# Patient Record
Sex: Female | Born: 1950
Health system: Southern US, Community
[De-identification: ages and names within clinical notes are randomized; demographics above are authoritative.]

## PROBLEM LIST (undated history)

## (undated) DIAGNOSIS — E119 Type 2 diabetes mellitus without complications: Secondary | ICD-10-CM

## (undated) DIAGNOSIS — H269 Unspecified cataract: Secondary | ICD-10-CM

## (undated) DIAGNOSIS — C801 Malignant (primary) neoplasm, unspecified: Secondary | ICD-10-CM

## (undated) DIAGNOSIS — N760 Acute vaginitis: Secondary | ICD-10-CM

## (undated) DIAGNOSIS — U071 COVID-19: Secondary | ICD-10-CM

## (undated) DIAGNOSIS — B9689 Other specified bacterial agents as the cause of diseases classified elsewhere: Secondary | ICD-10-CM

## (undated) DIAGNOSIS — K5790 Diverticulosis of intestine, part unspecified, without perforation or abscess without bleeding: Secondary | ICD-10-CM

## (undated) DIAGNOSIS — E1159 Type 2 diabetes mellitus with other circulatory complications: Secondary | ICD-10-CM

## (undated) DIAGNOSIS — Z923 Personal history of irradiation: Secondary | ICD-10-CM

## (undated) DIAGNOSIS — N390 Urinary tract infection, site not specified: Secondary | ICD-10-CM

## (undated) DIAGNOSIS — I10 Essential (primary) hypertension: Secondary | ICD-10-CM

## (undated) DIAGNOSIS — C50919 Malignant neoplasm of unspecified site of unspecified female breast: Secondary | ICD-10-CM

## (undated) DIAGNOSIS — E118 Type 2 diabetes mellitus with unspecified complications: Secondary | ICD-10-CM

## (undated) DIAGNOSIS — Z9221 Personal history of antineoplastic chemotherapy: Secondary | ICD-10-CM

## (undated) DIAGNOSIS — Z8619 Personal history of other infectious and parasitic diseases: Secondary | ICD-10-CM

## (undated) DIAGNOSIS — M1990 Unspecified osteoarthritis, unspecified site: Secondary | ICD-10-CM

## (undated) DIAGNOSIS — K219 Gastro-esophageal reflux disease without esophagitis: Secondary | ICD-10-CM

## (undated) DIAGNOSIS — I152 Hypertension secondary to endocrine disorders: Secondary | ICD-10-CM

## (undated) DIAGNOSIS — E785 Hyperlipidemia, unspecified: Secondary | ICD-10-CM

## (undated) DIAGNOSIS — F39 Unspecified mood [affective] disorder: Secondary | ICD-10-CM

## (undated) HISTORY — DX: Other specified bacterial agents as the cause of diseases classified elsewhere: N76.0

## (undated) HISTORY — DX: Malignant (primary) neoplasm, unspecified: C80.1

## (undated) HISTORY — DX: Urinary tract infection, site not specified: N39.0

## (undated) HISTORY — DX: Hyperlipidemia, unspecified: E78.5

## (undated) HISTORY — DX: Unspecified cataract: H26.9

## (undated) HISTORY — DX: Essential (primary) hypertension: I10

## (undated) HISTORY — DX: Personal history of other infectious and parasitic diseases: Z86.19

## (undated) HISTORY — DX: Unspecified osteoarthritis, unspecified site: M19.90

## (undated) HISTORY — DX: COVID-19: U07.1

## (undated) HISTORY — DX: Type 2 diabetes mellitus without complications: E11.9

## (undated) HISTORY — DX: Gastro-esophageal reflux disease without esophagitis: K21.9

## (undated) HISTORY — PX: WRIST SURGERY: SHX841

## (undated) HISTORY — DX: Other specified bacterial agents as the cause of diseases classified elsewhere: B96.89

## (undated) HISTORY — DX: Diverticulosis of intestine, part unspecified, without perforation or abscess without bleeding: K57.90

## (undated) HISTORY — PX: BREAST SURGERY: SHX581

---

## 1998-10-04 DIAGNOSIS — C50911 Malignant neoplasm of unspecified site of right female breast: Secondary | ICD-10-CM | POA: Insufficient documentation

## 1998-10-04 DIAGNOSIS — Z9221 Personal history of antineoplastic chemotherapy: Secondary | ICD-10-CM

## 1998-10-04 DIAGNOSIS — Z923 Personal history of irradiation: Secondary | ICD-10-CM

## 1998-10-04 HISTORY — PX: BREAST EXCISIONAL BIOPSY: SUR124

## 1998-10-04 HISTORY — DX: Malignant neoplasm of unspecified site of right female breast: C50.911

## 1998-10-04 HISTORY — DX: Personal history of irradiation: Z92.3

## 1998-10-04 HISTORY — DX: Personal history of antineoplastic chemotherapy: Z92.21

## 2016-12-17 LAB — HM HEPATITIS C SCREENING LAB: HM Hepatitis Screen: NEGATIVE

## 2017-02-23 LAB — HM MAMMOGRAPHY

## 2017-03-15 LAB — HM DEXA SCAN: HM Dexa Scan: NORMAL

## 2018-01-26 ENCOUNTER — Encounter: Payer: Self-pay | Admitting: Family Medicine

## 2018-03-16 ENCOUNTER — Ambulatory Visit: Payer: 59 | Admitting: Internal Medicine

## 2018-03-16 VITALS — BP 152/86 | HR 98 | Temp 98.9°F | Ht 68.5 in | Wt 160.6 lb

## 2018-03-16 DIAGNOSIS — R768 Other specified abnormal immunological findings in serum: Secondary | ICD-10-CM | POA: Diagnosis not present

## 2018-03-16 DIAGNOSIS — I10 Essential (primary) hypertension: Secondary | ICD-10-CM

## 2018-03-16 DIAGNOSIS — Z1231 Encounter for screening mammogram for malignant neoplasm of breast: Secondary | ICD-10-CM

## 2018-03-16 DIAGNOSIS — Z853 Personal history of malignant neoplasm of breast: Secondary | ICD-10-CM | POA: Diagnosis not present

## 2018-03-16 DIAGNOSIS — Z23 Encounter for immunization: Secondary | ICD-10-CM

## 2018-03-16 DIAGNOSIS — E785 Hyperlipidemia, unspecified: Secondary | ICD-10-CM | POA: Diagnosis not present

## 2018-03-16 DIAGNOSIS — E1169 Type 2 diabetes mellitus with other specified complication: Secondary | ICD-10-CM | POA: Insufficient documentation

## 2018-03-16 DIAGNOSIS — E1165 Type 2 diabetes mellitus with hyperglycemia: Secondary | ICD-10-CM

## 2018-03-16 NOTE — Progress Notes (Signed)
Pre visit review using our clinic review tool, if applicable. No additional management support is needed unless otherwise documented below in the visit note. 

## 2018-03-16 NOTE — Patient Instructions (Addendum)
Please schedule your mammogram I will order if today We will refer you to [facility] and Dr. Kellie Moor dermatology  You were given pneumonia 23 vaccine today  Please add back hydrochlorothiazide 12.5 mg daily in the am  We will add Januvia, or Tradjenta or Onglyza to control your diabetes  F/u in 3 months  Take care     Diabetes Mellitus and Nutrition When you have diabetes (diabetes mellitus), it is very important to have healthy eating habits because your blood sugar (glucose) levels are greatly affected by what you eat and drink. Eating healthy foods in the appropriate amounts, at about the same times every day, can help you:  Control your blood glucose.  Lower your risk of heart disease.  Improve your blood pressure.  Reach or maintain a healthy weight.  Every person with diabetes is different, and each person has different needs for a meal plan. Your health care provider may recommend that you work with a diet and nutrition specialist (dietitian) to make a meal plan that is best for you. Your meal plan may vary depending on factors such as:  The calories you need.  The medicines you take.  Your weight.  Your blood glucose, blood pressure, and cholesterol levels.  Your activity level.  Other health conditions you have, such as heart or kidney disease.  How do carbohydrates affect me? Carbohydrates affect your blood glucose level more than any other type of food. Eating carbohydrates naturally increases the amount of glucose in your blood. Carbohydrate counting is a method for keeping track of how many carbohydrates you eat. Counting carbohydrates is important to keep your blood glucose at a healthy level, especially if you use insulin or take certain oral diabetes medicines. It is important to know how many carbohydrates you can safely have in each meal. This is different for every person. Your dietitian can help you calculate how many carbohydrates you should have at each  meal and for snack. Foods that contain carbohydrates include:  Bread, cereal, rice, pasta, and crackers.  Potatoes and corn.  Peas, beans, and lentils.  Milk and yogurt.  Fruit and juice.  Desserts, such as cakes, cookies, ice cream, and candy.  How does alcohol affect me? Alcohol can cause a sudden decrease in blood glucose (hypoglycemia), especially if you use insulin or take certain oral diabetes medicines. Hypoglycemia can be a life-threatening condition. Symptoms of hypoglycemia (sleepiness, dizziness, and confusion) are similar to symptoms of having too much alcohol. If your health care provider says that alcohol is safe for you, follow these guidelines:  Limit alcohol intake to no more than 1 drink per day for nonpregnant women and 2 drinks per day for men. One drink equals 12 oz of beer, 5 oz of wine, or 1 oz of hard liquor.  Do not drink on an empty stomach.  Keep yourself hydrated with water, diet soda, or unsweetened iced tea.  Keep in mind that regular soda, juice, and other mixers may contain a lot of sugar and must be counted as carbohydrates.  What are tips for following this plan? Reading food labels  Start by checking the serving size on the label. The amount of calories, carbohydrates, fats, and other nutrients listed on the label are based on one serving of the food. Many foods contain more than one serving per package.  Check the total grams (g) of carbohydrates in one serving. You can calculate the number of servings of carbohydrates in one serving by dividing the  total carbohydrates by 15. For example, if a food has 30 g of total carbohydrates, it would be equal to 2 servings of carbohydrates.  Check the number of grams (g) of saturated and trans fats in one serving. Choose foods that have low or no amount of these fats.  Check the number of milligrams (mg) of sodium in one serving. Most people should limit total sodium intake to less than 2,300 mg per  day.  Always check the nutrition information of foods labeled as "low-fat" or "nonfat". These foods may be higher in added sugar or refined carbohydrates and should be avoided.  Talk to your dietitian to identify your daily goals for nutrients listed on the label. Shopping  Avoid buying canned, premade, or processed foods. These foods tend to be high in fat, sodium, and added sugar.  Shop around the outside edge of the grocery store. This includes fresh fruits and vegetables, bulk grains, fresh meats, and fresh dairy. Cooking  Use low-heat cooking methods, such as baking, instead of high-heat cooking methods like deep frying.  Cook using healthy oils, such as olive, canola, or sunflower oil.  Avoid cooking with butter, cream, or high-fat meats. Meal planning  Eat meals and snacks regularly, preferably at the same times every day. Avoid going long periods of time without eating.  Eat foods high in fiber, such as fresh fruits, vegetables, beans, and whole grains. Talk to your dietitian about how many servings of carbohydrates you can eat at each meal.  Eat 4-6 ounces of lean protein each day, such as lean meat, chicken, fish, eggs, or tofu. 1 ounce is equal to 1 ounce of meat, chicken, or fish, 1 egg, or 1/4 cup of tofu.  Eat some foods each day that contain healthy fats, such as avocado, nuts, seeds, and fish. Lifestyle   Check your blood glucose regularly.  Exercise at least 30 minutes 5 or more days each week, or as told by your health care provider.  Take medicines as told by your health care provider.  Do not use any products that contain nicotine or tobacco, such as cigarettes and e-cigarettes. If you need help quitting, ask your health care provider.  Work with a Social worker or diabetes educator to identify strategies to manage stress and any emotional and social challenges. What are some questions to ask my health care provider?  Do I need to meet with a diabetes  educator?  Do I need to meet with a dietitian?  What number can I call if I have questions?  When are the best times to check my blood glucose? Where to find more information:  American Diabetes Association: diabetes.org/food-and-fitness/food  Academy of Nutrition and Dietetics: PokerClues.dk  [employer] of Diabetes and Digestive and Kidney Diseases (NIH): ContactWire.be Summary  A healthy meal plan will help you control your blood glucose and maintain a healthy lifestyle.  Working with a diet and nutrition specialist (dietitian) can help you make a meal plan that is best for you.  Keep in mind that carbohydrates and alcohol have immediate effects on your blood glucose levels. It is important to count carbohydrates and to use alcohol carefully. This information is not intended to replace advice given to you by your health care provider. Make sure you discuss any questions you have with your health care provider. Document Released: 06/17/2005 Document Revised: 10/25/2016 Document Reviewed: 10/25/2016 Elsevier Interactive Patient Education  2018 [place].  Pneumococcal Polysaccharide Vaccine: What You Need to Know 1. Why get vaccinated?  Vaccination can protect older adults (and some children and younger adults) from pneumococcal disease. Pneumococcal disease is caused by bacteria that can spread from person to person through close contact. It can cause ear infections, and it can also lead to more serious infections of the:  Lungs (pneumonia),  Blood (bacteremia), and  Covering of the brain and spinal cord (meningitis). Meningitis can cause deafness and brain damage, and it can be fatal.  Anyone can get pneumococcal disease, but children under 67 years of age, people with certain medical conditions, adults over 82 years of age, and cigarette smokers  are at the highest risk. About 18,000 older adults die each year from pneumococcal disease in the [place]. Treatment of pneumococcal infections with penicillin and other drugs used to be more effective. But some strains of the disease have become resistant to these drugs. This makes prevention of the disease, through vaccination, even more important. 2. Pneumococcal polysaccharide vaccine (PPSV23) Pneumococcal polysaccharide vaccine (PPSV23) protects against 23 types of pneumococcal bacteria. It will not prevent all pneumococcal disease. PPSV23 is recommended for:  All adults 32 years of age and older,  Anyone 2 through 67 years of age with certain long-term health problems,  Anyone 2 through 67 years of age with a weakened immune system,  Adults 56 through 67 years of age who smoke cigarettes or have asthma.  Most people need only one dose of PPSV. A second dose is recommended for certain high-risk groups. People 71 and older should get a dose even if they have gotten one or more doses of the vaccine before they turned 65. Your healthcare provider can give you more information about these recommendations. Most healthy adults develop protection within 2 to 3 weeks of getting the shot. 3. Some people should not get this vaccine  Anyone who has had a life-threatening allergic reaction to PPSV should not get another dose.  Anyone who has a severe allergy to any component of PPSV should not receive it. Tell your provider if you have any severe allergies.  Anyone who is moderately or severely ill when the shot is scheduled may be asked to wait until they recover before getting the vaccine. Someone with a mild illness can usually be vaccinated.  Children less than 15 years of age should not receive this vaccine.  There is no evidence that PPSV is harmful to either a pregnant woman or to her fetus. However, as a precaution, women who need the vaccine should be vaccinated before becoming  pregnant, if possible. 4. Risks of a vaccine reaction With any medicine, including vaccines, there is a chance of side effects. These are usually mild and go away on their own, but serious reactions are also possible. About half of people who get PPSV have mild side effects, such as redness or pain where the shot is given, which go away within about two days. Less than 1 out of 100 people develop a fever, muscle aches, or more severe local reactions. Problems that could happen after any vaccine:  People sometimes faint after a medical procedure, including vaccination. Sitting or lying down for about 15 minutes can help prevent fainting, and injuries caused by a fall. Tell your doctor if you feel dizzy, or have vision changes or ringing in the ears.  Some people get severe pain in the shoulder and have difficulty moving the arm where a shot was given. This happens very rarely.  Any medication can cause a severe allergic reaction. Such reactions from a  vaccine are very rare, estimated at about 1 in a million doses, and would happen within a few minutes to a few hours after the vaccination. As with any medicine, there is a very remote chance of a vaccine causing a serious injury or death. The safety of vaccines is always being monitored. For more information, visit: http://www.aguilar.org/ 5. What if there is a serious reaction? What should I look for? Look for anything that concerns you, such as signs of a severe allergic reaction, very high fever, or unusual behavior. Signs of a severe allergic reaction can include hives, swelling of the face and throat, difficulty breathing, a fast heartbeat, dizziness, and weakness. These would usually start a few minutes to a few hours after the vaccination. What should I do? If you think it is a severe allergic reaction or other emergency that can't wait, call 9-1-1 or get to the nearest hospital. Otherwise, call your doctor. Afterward, the reaction should  be reported to the Vaccine Adverse Event Reporting System (VAERS). Your doctor might file this report, or you can do it yourself through the VAERS web site at www.vaers.SamedayNews.es, or by calling (985)844-9698. VAERS does not give medical advice. 6. How can I learn more?  Ask your doctor. He or she can give you the vaccine package insert or suggest other sources of information.  Call your local or state health department.  Contact the Centers for Disease Control and Prevention (CDC): ? Call 910-417-4482 (1-800-CDC-INFO) or ? Visit CDC's website at http://hunter.com/ CDC Pneumococcal Polysaccharide Vaccine VIS (01/25/14) This information is not intended to replace advice given to you by your health care provider. Make sure you discuss any questions you have with your health care provider. Document Released: 07/18/2006 Document Revised: 06/10/2016 Document Reviewed: 06/10/2016 Elsevier Interactive Patient Education  2017 [employer].

## 2018-03-17 ENCOUNTER — Encounter: Payer: Self-pay | Admitting: Internal Medicine

## 2018-03-17 MED ORDER — HYDROCHLOROTHIAZIDE 12.5 MG PO CAPS: 12.5000 mg | ORAL_CAPSULE | Freq: Every day | ORAL | 3 refills | Status: DC

## 2018-03-17 MED ORDER — HYDROCHLOROTHIAZIDE 12.5 MG PO CAPS: 12.5000 mg | ORAL_CAPSULE | Freq: Every day | ORAL | 0 refills | Status: DC

## 2018-03-17 MED ORDER — SITAGLIPTIN PHOSPHATE 50 MG PO TABS: 50.0000 mg | ORAL_TABLET | Freq: Every day | ORAL | 1 refills | Status: DC

## 2018-03-17 NOTE — Progress Notes (Signed)
Chief Complaint  Patient presents with  . New Patient (Initial Visit)   New patient to establish PCP 1. H/o right breast cancer needs dx mammo sch last mammo 02/23/17 negative  2. HTN uncontrolled repeat BP 152/64 taking losartan 100 mg qd used to take hctz 12.5 mg qd  3. DM 2 per pt A1C >7.0 recently though reviewed labs and no A1C in labs will get records. She is checking cbg 1x per day   Review of Systems  Constitutional: Negative for weight loss.  HENT: Negative for hearing loss.   Eyes: Negative for blurred vision.  Respiratory: Negative for shortness of breath.   Cardiovascular: Negative for chest pain.  Gastrointestinal: Negative for abdominal pain.  Musculoskeletal: Negative for falls.  Skin: Negative for rash.  Neurological: Negative for headaches.  Psychiatric/Behavioral: Negative for depression and memory loss.   Past Medical History:  Diagnosis Date  . Arthritis   . Cancer [facility])    right breast cancer 2000 lumpectomy 8 rounds chemo, 6 weeks radiation mammo 02/24/17 negative on Arimedex x 7 years   . Diabetes mellitus without complication ([place])   . History of chicken pox   . Hyperlipidemia   . Hypertension   . UTI (urinary tract infection)    Past Surgical History:  Procedure Laterality Date  . BREAST SURGERY     2000  . WRIST SURGERY     Family History  Problem Relation Age of Onset  . Cancer Mother        colon age 30  . Heart disease Father   . Stroke Father   . Arthritis Brother   . Diabetes Brother   . Cancer Maternal Grandmother        breast   . Heart disease Maternal Grandfather   . Arthritis Brother   . Alcohol abuse Son   . Hypertension Son    Social History   Socioeconomic History  . Marital status: Married    Spouse name: Not on file  . Number of children: Not on file  . Years of education: Not on file  . Highest education level: Not on file  Occupational History  . Not on file  Social Needs  . Financial resource strain: Not on file   . Food insecurity:    Worry: Not on file    Inability: Not on file  . Transportation needs:    Medical: Not on file    Non-medical: Not on file  Tobacco Use  . Smoking status: Never Smoker  . Smokeless tobacco: Never Used  Substance and Sexual Activity  . Alcohol use: Yes  . Drug use: Not Currently  . Sexual activity: Yes    Partners: Male  Lifestyle  . Physical activity:    Days per week: Not on file    Minutes per session: Not on file  . Stress: Not on file  Relationships  . Social connections:    Talks on phone: Not on file    Gets together: Not on file    Attends religious service: Not on file    Active member of club or organization: Not on file    Attends meetings of clubs or organizations: Not on file    Relationship status: Not on file  . Intimate partner violence:    Fear of current or ex partner: Not on file    Emotionally abused: Not on file    Physically abused: Not on file    Forced sexual activity: Not on file  Other  Topics Concern  . 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 hydrochlorothiazide (MICROZIDE) 12.5 MG capsule Take 1 capsule (12.5 mg total) by mouth daily. In am  . hydrochlorothiazide (MICROZIDE) 12.5 MG capsule Take 1 capsule (12.5 mg total) by mouth daily. In am  . losartan (COZAAR) 100 MG tablet Take 100 mg by mouth daily.  . meloxicam (MOBIC) 15 MG tablet Take 15 mg by mouth daily.  . metFORMIN (GLUCOPHAGE-XR) 500 MG 24 hr tablet Take 500 mg by mouth. 1TAB in the morning and 2 TAB in the evening.  . Multiple Vitamin (MULTI-VITAMIN DAILY PO) Take by mouth.  . Omega-3 Fatty Acids (FISH OIL PO) Take by mouth.  . rosuvastatin (CRESTOR) 10 MG tablet Take 10 mg by mouth daily.  . Vitamin D,  Cholecalciferol, 1000 units CAPS Take by mouth.  . zolpidem (AMBIEN) 5 MG tablet Take 5 mg by mouth at bedtime as needed for sleep.   No Known Allergies No results found for this or any previous visit (from the past 2160 hour(s)). Objective  Body mass index is 24.06 kg/m. Wt Readings from Last 3 Encounters:  03/16/18 160 lb 9.6 oz (72.8 kg)   Temp Readings from Last 3 Encounters:  03/16/18 98.9 F (37.2 C) (Oral)   BP Readings from Last 3 Encounters:  03/16/18 (!) 152/86   Pulse Readings from Last 3 Encounters:  03/16/18 98    Physical Exam  Constitutional: She is oriented to person, place, and time. She appears well-developed and well-nourished. She is cooperative.  HENT:  Head: Normocephalic and atraumatic.  Mouth/Throat: Oropharynx is clear and moist and mucous membranes are normal.  Eyes: Pupils are equal, round, and reactive to light. Conjunctivae are normal.  Cardiovascular: Normal rate, regular rhythm and normal heart sounds.  Pulmonary/Chest: Effort normal and breath sounds normal.  Neurological: She is alert and oriented to person, place, and time. Gait normal.  Skin: Skin is warm, dry and intact.  Psychiatric: She has a normal mood and affect. Her speech is normal and behavior is normal. Judgment and thought content normal. Cognition and memory are normal.  Nursing note and vitals reviewed.   Assessment   1. HTN/HLD 2. DM 2 She has been on Invokana, Jardiance just stopped [place] due to feeling UTI/yeast sx's with hyperglycemia in the 190s fasting am Not agreeable to injectables currently though disc Victoza/Ozempic   3. H/o right breast cancer mammo 02/23/17 neg 4. H/o lyme + w/o sx's lyme 18 kdIgg, 39 kd igG, 58kd igg, 23 kd igM+Borrelia bands 18.23, 28, 30, 39, 41, 45, 58 w/o sx's  5. HM Plan  1.  Cont losartan 100 add back hctz 12.5 mg qd  Pt never picked up Rx vascepa TGs only sl elevated  Cont Crestor 10   01/05/18 CBC nl, TSH 2.390 TC 109, TG 154, HDL  35, LDL 43, CMET Na 134 Glucose 156, vitamin D 40.7  UA had 12/17/16 and urine protein 2.  On metformin 500 mg xr 1 in am and 2 qhs pna 23 vaccine today  Add Januvia 50 mg qd declines injections for now  Referred to [facility]  DM eye exam  Foot exam at f/u  Records last PCP UA, Urine protein, A1C 3. Referred dx mammo b/l  4.  NTD  5.  Flu had 2018  prevnar had 02/03/17  pna 23 vaccine today  Rx shingrix today  Tdap had 2017 per pt  Records labs, vaccines,pap 2015 and colonoscopy last PCP in [place].  DEXA 03/16/17 normal scanned in chart  Mammogram 02/23/17 dx mammo neg  Provider: Dr. Olivia Mackie McLean-Scocuzza-[facility]

## 2018-03-20 ENCOUNTER — Inpatient Hospital Stay
Admission: RE | Admit: 2018-03-20 | Discharge: 2018-03-20 | Disposition: A | Discharge status: Home / Self Care | Payer: Self-pay | Source: Ambulatory Visit | Attending: *Deleted | Admitting: *Deleted

## 2018-03-20 ENCOUNTER — Other Ambulatory Visit: Payer: Self-pay | Admitting: *Deleted

## 2018-03-20 DIAGNOSIS — Z9289 Personal history of other medical treatment: Secondary | ICD-10-CM

## 2018-03-29 ENCOUNTER — Inpatient Hospital Stay
Admission: RE | Admit: 2018-03-29 | Discharge: 2018-03-29 | Disposition: A | Discharge status: Home / Self Care | Payer: Self-pay | Source: Ambulatory Visit | Attending: *Deleted | Admitting: *Deleted

## 2018-03-29 ENCOUNTER — Other Ambulatory Visit: Payer: Self-pay | Admitting: *Deleted

## 2018-03-29 DIAGNOSIS — Z9289 Personal history of other medical treatment: Secondary | ICD-10-CM

## 2018-03-31 ENCOUNTER — Other Ambulatory Visit: Payer: Self-pay | Admitting: Internal Medicine

## 2018-03-31 DIAGNOSIS — Z1231 Encounter for screening mammogram for malignant neoplasm of breast: Secondary | ICD-10-CM

## 2018-04-17 ENCOUNTER — Ambulatory Visit
Admission: RE | Admit: 2018-04-17 | Discharge: 2018-04-17 | Disposition: A | Discharge status: Home / Self Care | Payer: 59 | Source: Ambulatory Visit | Attending: Internal Medicine | Admitting: Internal Medicine

## 2018-04-17 ENCOUNTER — Encounter (HOSPITAL_COMMUNITY): Payer: Self-pay

## 2018-04-17 DIAGNOSIS — Z1231 Encounter for screening mammogram for malignant neoplasm of breast: Secondary | ICD-10-CM | POA: Insufficient documentation

## 2018-04-17 HISTORY — DX: Personal history of antineoplastic chemotherapy: Z92.21

## 2018-04-17 HISTORY — DX: Personal history of irradiation: Z92.3

## 2018-05-18 LAB — HM DIABETES EYE EXAM

## 2018-05-23 ENCOUNTER — Telehealth: Payer: Self-pay | Admitting: *Deleted

## 2018-05-23 ENCOUNTER — Other Ambulatory Visit: Payer: Self-pay

## 2018-05-23 DIAGNOSIS — E1165 Type 2 diabetes mellitus with hyperglycemia: Secondary | ICD-10-CM

## 2018-05-23 MED ORDER — SITAGLIPTIN PHOSPHATE 50 MG PO TABS: 50.0000 mg | ORAL_TABLET | Freq: Every day | ORAL | 1 refills | Status: DC

## 2018-05-23 NOTE — Telephone Encounter (Signed)
Medication has been refilled.

## 2018-05-23 NOTE — Telephone Encounter (Signed)
Copied from [place] [id number]. Topic: Quick Communication - Rx Refill/Question >> May 23, 2018 12:57 PM Carolyn Stare wrote: Medication  sitaGLIPtin (JANUVIA) 50 MG tablet      med was sent to [facility] pt req it be sent to [facility] order   Preferred Pharmacy    [facility]   Agent: Please be advised that RX refills may take up to 3 business days. We ask that you follow-up with your pharmacy.

## 2018-05-25 MED ORDER — SITAGLIPTIN PHOSPHATE 50 MG PO TABS: 50.0000 mg | ORAL_TABLET | Freq: Every day | ORAL | 1 refills | Status: DC

## 2018-05-25 NOTE — Addendum Note (Signed)
Addended byElpidio Galea T on: 05/25/2018 03:42 PM   Modules accepted: Orders

## 2018-05-25 NOTE — Telephone Encounter (Signed)
Veronica from [facility] order calling about this RX.  States that pt has a high copay and regardless of 30, 60, or 90 day supply it will be same cost.   Pharmacy would like to know if a RX can be sent for the 90 day supply to help pt. Verdene Lennert can be reached at [phone].

## 2018-05-25 NOTE — Telephone Encounter (Signed)
Please advise 

## 2018-05-31 ENCOUNTER — Encounter: Payer: Self-pay | Admitting: Internal Medicine

## 2018-06-13 ENCOUNTER — Encounter: Payer: Self-pay | Admitting: Internal Medicine

## 2018-06-13 ENCOUNTER — Ambulatory Visit: Payer: 59 | Admitting: Internal Medicine

## 2018-06-13 VITALS — BP 152/88 | HR 96 | Temp 98.3°F | Ht 68.5 in | Wt 162.2 lb

## 2018-06-13 DIAGNOSIS — G4700 Insomnia, unspecified: Secondary | ICD-10-CM | POA: Diagnosis not present

## 2018-06-13 DIAGNOSIS — E1165 Type 2 diabetes mellitus with hyperglycemia: Secondary | ICD-10-CM | POA: Diagnosis not present

## 2018-06-13 DIAGNOSIS — Z1283 Encounter for screening for malignant neoplasm of skin: Secondary | ICD-10-CM

## 2018-06-13 DIAGNOSIS — Z23 Encounter for immunization: Secondary | ICD-10-CM

## 2018-06-13 DIAGNOSIS — I10 Essential (primary) hypertension: Secondary | ICD-10-CM | POA: Diagnosis not present

## 2018-06-13 LAB — POCT GLYCOSYLATED HEMOGLOBIN (HGB A1C)
HbA1c POC (<> result, manual entry): 6.9 % (ref 4.0–5.6)
Hemoglobin A1C: 6.9 % — AB (ref 4.0–5.6)

## 2018-06-13 MED ORDER — EMPAGLIFLOZIN 10 MG PO TABS: 10.0000 mg | ORAL_TABLET | Freq: Every day | ORAL | 0 refills | Status: DC

## 2018-06-13 MED ORDER — LOSARTAN POTASSIUM-HCTZ 100-25 MG PO TABS: 1.0000 | ORAL_TABLET | Freq: Every day | ORAL | 3 refills | Status: DC

## 2018-06-13 MED ORDER — GLUCOSE BLOOD VI STRP: ORAL_STRIP | 0 refills | Status: DC

## 2018-06-13 MED ORDER — ROSUVASTATIN CALCIUM 10 MG PO TABS: 10.0000 mg | ORAL_TABLET | Freq: Every day | ORAL | 3 refills | Status: DC

## 2018-06-13 MED ORDER — GLUCOSE BLOOD VI STRP: ORAL_STRIP | 3 refills | Status: DC

## 2018-06-13 MED ORDER — EMPAGLIFLOZIN 10 MG PO TABS: 10.0000 mg | ORAL_TABLET | Freq: Every day | ORAL | 3 refills | Status: DC

## 2018-06-13 MED ORDER — ZOLPIDEM TARTRATE 5 MG PO TABS: 5.0000 mg | ORAL_TABLET | Freq: Every evening | ORAL | 1 refills | Status: DC | PRN

## 2018-06-13 MED ORDER — LOSARTAN POTASSIUM-HCTZ 100-25 MG PO TABS: 1.0000 | ORAL_TABLET | Freq: Every day | ORAL | 0 refills | Status: DC

## 2018-06-13 MED ORDER — METFORMIN HCL ER 500 MG PO TB24: ORAL_TABLET | ORAL | 3 refills | Status: DC

## 2018-06-13 NOTE — Progress Notes (Signed)
Pre visit review using our clinic review tool, if applicable. No additional management support is needed unless otherwise documented below in the visit note. 

## 2018-06-13 NOTE — Progress Notes (Signed)
Chief Complaint  Patient presents with  . Follow-up   F/u  1. DM 2 last A1C >7.0 with former PCP cbgs in am 160s-190s per pt likes carbs januvia 50 mg and metformin 500 in am and 1000 qpm was on jardiance 10 which helped [place] expensive and jardiance now preferred on planned prev also took [place] and didn't like the way it made her feel no issue with jardiance per pt she enjoys carbs  2. HTN BP elevated on hctz 12.5 and losartan 100 mg qd and had meds this am 7 am at home she is getting >130s/>80s  3. H/o precancer vs nmsc left leg saw dermatology 5 years ago and h/o sun exposure in young age wants referral  4. Reviewed prior labs 01/2018 and mammogram and dexa and eye exam   Review of Systems  Constitutional: Negative for weight loss.  HENT: Negative for hearing loss.   Eyes: Negative for blurred vision.  Respiratory: Negative for shortness of breath.   Cardiovascular: Negative for chest pain.  Gastrointestinal: Negative for abdominal pain.  Musculoskeletal: Negative for falls.  Skin: Negative for rash.  Neurological: Negative for headaches.  Psychiatric/Behavioral: Negative for depression and memory loss.   Past Medical History:  Diagnosis Date  . Arthritis   . Cancer [facility])    right breast cancer 2000 lumpectomy 8 rounds chemo, 6 weeks radiation mammo 02/24/17 negative on Arimedex x 7 years   . Diabetes mellitus without complication ([place])   . History of chicken pox   . Hyperlipidemia   . Hypertension   . Personal history of chemotherapy 2000   Rt. Breast  . Personal history of radiation therapy 2000   Rt. breast  . UTI (urinary tract infection)    Past Surgical History:  Procedure Laterality Date  . BREAST EXCISIONAL BIOPSY Right 2000   +  . BREAST SURGERY     2000  . WRIST SURGERY     Family History  Problem Relation Age of Onset  . Cancer Mother        colon age 42  . Heart disease Father   . Stroke Father   . Arthritis Brother   . Diabetes Brother   . Cancer  Maternal Grandmother        breast   . Breast cancer Maternal Grandmother   . Heart disease Maternal Grandfather   . Arthritis Brother   . Alcohol abuse Son   . Hypertension Son    Social History   Socioeconomic History  . Marital status: Married    Spouse name: Not on file  . Number of children: Not on file  . Years of education: Not on file  . Highest education level: Not on file  Occupational History  . Not on file  Social Needs  . Financial resource strain: Not on file  . Food insecurity:    Worry: Not on file    Inability: Not on file  . Transportation needs:    Medical: Not on file    Non-medical: Not on file  Tobacco Use  . Smoking status: Never Smoker  . Smokeless tobacco: Never Used  Substance and Sexual Activity  . Alcohol use: Yes  . Drug use: Not Currently  . Sexual activity: Yes    Partners: Male  Lifestyle  . Physical activity:    Days per week: Not on file    Minutes per session: Not on file  . Stress: Not on file  Relationships  . Social connections:  Talks on phone: Not on file    Gets together: Not on file    Attends religious service: Not on file    Active member of club or organization: Not on file    Attends meetings of clubs or organizations: Not on file    Relationship status: Not on file  . Intimate partner violence:    Fear of current or ex partner: Not on file    Emotionally abused: Not on file    Physically abused: Not on file    Forced sexual activity: Not on file  Other Topics Concern  . 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 dapagliflozin propanediol (FARXIGA) 10 MG TABS tablet Take 10 mg by mouth daily.  . hydrochlorothiazide (MICROZIDE) 12.5 MG capsule Take 1 capsule (12.5 mg  total) by mouth daily. In am  . hydrochlorothiazide (MICROZIDE) 12.5 MG capsule Take 1 capsule (12.5 mg total) by mouth daily. In am  . losartan (COZAAR) 100 MG tablet Take 100 mg by mouth daily.  . meloxicam (MOBIC) 15 MG tablet Take 15 mg by mouth daily.  . metFORMIN (GLUCOPHAGE-XR) 500 MG 24 hr tablet Take 500 mg by mouth. 1TAB in the morning and 2 TAB in the evening.  . Multiple Vitamin (MULTI-VITAMIN DAILY PO) Take by mouth.  . Omega-3 Fatty Acids (FISH OIL PO) Take by mouth.  . rosuvastatin (CRESTOR) 10 MG tablet Take 10 mg by mouth daily.  . sitaGLIPtin (JANUVIA) 50 MG tablet Take 1 tablet (50 mg total) by mouth daily.  . Vitamin D, Cholecalciferol, 1000 units CAPS Take by mouth.  . zolpidem (AMBIEN) 5 MG tablet Take 5 mg by mouth at bedtime as needed for sleep.   No Known Allergies Recent Results (from the past 2160 hour(s))  HM DIABETES EYE EXAM     Status: None   Collection Time: 05/18/18 12:00 AM  Result Value Ref Range   HM Diabetic Eye Exam No Retinopathy No Retinopathy    Comment: [facility] eye f/u i n1 year    Objective  Body mass index is 24.3 kg/m. Wt Readings from Last 3 Encounters:  06/13/18 162 lb 3.2 oz (73.6 kg)  03/16/18 160 lb 9.6 oz (72.8 kg)   Temp Readings from Last 3 Encounters:  06/13/18 98.3 F (36.8 C) (Oral)  03/16/18 98.9 F (37.2 C) (Oral)   BP Readings from Last 3 Encounters:  06/13/18 (!) 152/88  03/16/18 (!) 152/86   Pulse Readings from Last 3 Encounters:  06/13/18 96  03/16/18 98    Physical Exam  Constitutional: She is oriented to person, place, and time. Vital signs are normal. She appears well-developed and well-nourished. She is cooperative.  HENT:  Head: Normocephalic and atraumatic.  Mouth/Throat: Oropharynx is clear and moist and mucous membranes are normal.  Eyes: Pupils are equal, round, and reactive to light. Conjunctivae are normal.  Cardiovascular: Normal rate, regular rhythm and normal heart sounds.  Pulmonary/Chest:  Effort normal and breath sounds normal.  Neurological: She is alert and oriented to person, place, and time.  Skin: Skin is warm and dry.  Possible solar/actinic damage to legs and chest  Psychiatric: She has a normal mood and affect. Her speech is normal and behavior is normal. Judgment and thought content normal. Cognition and memory are normal.  Nursing note and vitals reviewed.   Assessment   1. DM 2 A1C 6.9 today poc  2. HTN  3. HM Plan   1. D/c januvia 50 change to jardiance 10 due to affordability  rec reduce carbs  Cont metformin  Eye exam 05/18/18 neg  Will do foot exam in future  UA/urine protein done today  Refilled chronic medications  2. Inc losartan 100 hctz 12.5 to hyzaar 100-25 mg qd  Monitor  3.  Given flu shot today  prevnar had 02/03/17  pna 23 utd   Rx shingrix in past  Tdap had 2017 per pt   Consider check MMR, hep B/C at next visit   Requested Records labs, vaccines,pap 2015 and colonoscopy last PCP per pt had 05/2014 normal mom +colon cancer due 05/2019 DEXA 03/16/17 normal scanned in chart  Mammogram 04/17/18 negative  Out of age window pap no records from prior PCP  Referred dermatology today  Fasting labs comprehensive at next visit   Provider: Dr. Olivia Mackie McLean-Scocuzza-Internal Medicine

## 2018-06-13 NOTE — Patient Instructions (Addendum)
Zevia soda  Wheat/multigrain  Zucchini or Squash pasta alternatives   A1C 6.9 at goal <7.0   Diabetes Mellitus and Nutrition When you have diabetes (diabetes mellitus), it is very important to have healthy eating habits because your blood sugar (glucose) levels are greatly affected by what you eat and drink. Eating healthy foods in the appropriate amounts, at about the same times every day, can help you:  Control your blood glucose.  Lower your risk of heart disease.  Improve your blood pressure.  Reach or maintain a healthy weight.  Every person with diabetes is different, and each person has different needs for a meal plan. Your health care provider may recommend that you work with a diet and nutrition specialist (dietitian) to make a meal plan that is best for you. Your meal plan may vary depending on factors such as:  The calories you need.  The medicines you take.  Your weight.  Your blood glucose, blood pressure, and cholesterol levels.  Your activity level.  Other health conditions you have, such as heart or kidney disease.  How do carbohydrates affect me? Carbohydrates affect your blood glucose level more than any other type of food. Eating carbohydrates naturally increases the amount of glucose in your blood. Carbohydrate counting is a method for keeping track of how many carbohydrates you eat. Counting carbohydrates is important to keep your blood glucose at a healthy level, especially if you use insulin or take certain oral diabetes medicines. It is important to know how many carbohydrates you can safely have in each meal. This is different for every person. Your dietitian can help you calculate how many carbohydrates you should have at each meal and for snack. Foods that contain carbohydrates include:  Bread, cereal, rice, pasta, and crackers.  Potatoes and corn.  Peas, beans, and lentils.  Milk and yogurt.  Fruit and juice.  Desserts, such as cakes, cookies,  ice cream, and candy.  How does alcohol affect me? Alcohol can cause a sudden decrease in blood glucose (hypoglycemia), especially if you use insulin or take certain oral diabetes medicines. Hypoglycemia can be a life-threatening condition. Symptoms of hypoglycemia (sleepiness, dizziness, and confusion) are similar to symptoms of having too much alcohol. If your health care provider says that alcohol is safe for you, follow these guidelines:  Limit alcohol intake to no more than 1 drink per day for nonpregnant women and 2 drinks per day for men. One drink equals 12 oz of beer, 5 oz of wine, or 1 oz of hard liquor.  Do not drink on an empty stomach.  Keep yourself hydrated with water, diet soda, or unsweetened iced tea.  Keep in mind that regular soda, juice, and other mixers may contain a lot of sugar and must be counted as carbohydrates.  What are tips for following this plan? Reading food labels  Start by checking the serving size on the label. The amount of calories, carbohydrates, fats, and other nutrients listed on the label are based on one serving of the food. Many foods contain more than one serving per package.  Check the total grams (g) of carbohydrates in one serving. You can calculate the number of servings of carbohydrates in one serving by dividing the total carbohydrates by 15. For example, if a food has 30 g of total carbohydrates, it would be equal to 2 servings of carbohydrates.  Check the number of grams (g) of saturated and trans fats in one serving. Choose foods that have low or  no amount of these fats.  Check the number of milligrams (mg) of sodium in one serving. Most people should limit total sodium intake to less than 2,300 mg per day.  Always check the nutrition information of foods labeled as "low-fat" or "nonfat". These foods may be higher in added sugar or refined carbohydrates and should be avoided.  Talk to your dietitian to identify your daily goals for  nutrients listed on the label. Shopping  Avoid buying canned, premade, or processed foods. These foods tend to be high in fat, sodium, and added sugar.  Shop around the outside edge of the grocery store. This includes fresh fruits and vegetables, bulk grains, fresh meats, and fresh dairy. Cooking  Use low-heat cooking methods, such as baking, instead of high-heat cooking methods like deep frying.  Cook using healthy oils, such as olive, canola, or sunflower oil.  Avoid cooking with butter, cream, or high-fat meats. Meal planning  Eat meals and snacks regularly, preferably at the same times every day. Avoid going long periods of time without eating.  Eat foods high in fiber, such as fresh fruits, vegetables, beans, and whole grains. Talk to your dietitian about how many servings of carbohydrates you can eat at each meal.  Eat 4-6 ounces of lean protein each day, such as lean meat, chicken, fish, eggs, or tofu. 1 ounce is equal to 1 ounce of meat, chicken, or fish, 1 egg, or 1/4 cup of tofu.  Eat some foods each day that contain healthy fats, such as avocado, nuts, seeds, and fish. Lifestyle   Check your blood glucose regularly.  Exercise at least 30 minutes 5 or more days each week, or as told by your health care provider.  Take medicines as told by your health care provider.  Do not use any products that contain nicotine or tobacco, such as cigarettes and e-cigarettes. If you need help quitting, ask your health care provider.  Work with a Social worker or diabetes educator to identify strategies to manage stress and any emotional and social challenges. What are some questions to ask my health care provider?  Do I need to meet with a diabetes educator?  Do I need to meet with a dietitian?  What number can I call if I have questions?  When are the best times to check my blood glucose? Where to find more information:  American Diabetes Association:  diabetes.org/food-and-fitness/food  Academy of Nutrition and Dietetics: PokerClues.dk  [employer] of Diabetes and Digestive and Kidney Diseases (NIH): ContactWire.be Summary  A healthy meal plan will help you control your blood glucose and maintain a healthy lifestyle.  Working with a diet and nutrition specialist (dietitian) can help you make a meal plan that is best for you.  Keep in mind that carbohydrates and alcohol have immediate effects on your blood glucose levels. It is important to count carbohydrates and to use alcohol carefully. This information is not intended to replace advice given to you by your health care provider. Make sure you discuss any questions you have with your health care provider. Document Released: 06/17/2005 Document Revised: 10/25/2016 Document Reviewed: 10/25/2016 Elsevier Interactive Patient Education  [employer].

## 2018-06-14 LAB — URINALYSIS, ROUTINE W REFLEX MICROSCOPIC
Bilirubin Urine: NEGATIVE
Hgb urine dipstick: NEGATIVE
Ketones, ur: NEGATIVE
Leukocytes, UA: NEGATIVE
Nitrite: NEGATIVE
Protein, ur: NEGATIVE
Specific Gravity, Urine: 1.029 (ref 1.001–1.03)
pH: 6.5 (ref 5.0–8.0)

## 2018-06-14 LAB — MICROALBUMIN / CREATININE URINE RATIO
Creatinine, Urine: 32 mg/dL (ref 20–275)
Microalb Creat Ratio: 13 mcg/mg creat (ref <30)
Microalb, Ur: 0.4 mg/dL

## 2018-09-07 ENCOUNTER — Other Ambulatory Visit: Payer: Self-pay | Admitting: Internal Medicine

## 2018-09-07 DIAGNOSIS — I10 Essential (primary) hypertension: Secondary | ICD-10-CM

## 2018-09-07 MED ORDER — LOSARTAN POTASSIUM 100 MG PO TABS: 100.0000 mg | ORAL_TABLET | Freq: Every day | ORAL | 2 refills | Status: DC

## 2018-09-07 MED ORDER — HYDROCHLOROTHIAZIDE 25 MG PO TABS: 25.0000 mg | ORAL_TABLET | Freq: Every day | ORAL | 2 refills | Status: DC

## 2018-09-14 ENCOUNTER — Ambulatory Visit: Payer: 59 | Admitting: Internal Medicine

## 2018-10-11 ENCOUNTER — Ambulatory Visit: Payer: 59 | Admitting: Internal Medicine

## 2018-10-11 ENCOUNTER — Encounter: Payer: Self-pay | Admitting: Internal Medicine

## 2018-10-11 VITALS — BP 148/86 | HR 82 | Temp 97.7°F | Ht 68.5 in | Wt 157.4 lb

## 2018-10-11 DIAGNOSIS — Z0184 Encounter for antibody response examination: Secondary | ICD-10-CM

## 2018-10-11 DIAGNOSIS — I10 Essential (primary) hypertension: Secondary | ICD-10-CM | POA: Diagnosis not present

## 2018-10-11 DIAGNOSIS — E119 Type 2 diabetes mellitus without complications: Secondary | ICD-10-CM | POA: Diagnosis not present

## 2018-10-11 DIAGNOSIS — E559 Vitamin D deficiency, unspecified: Secondary | ICD-10-CM | POA: Diagnosis not present

## 2018-10-11 DIAGNOSIS — Z1329 Encounter for screening for other suspected endocrine disorder: Secondary | ICD-10-CM | POA: Diagnosis not present

## 2018-10-11 DIAGNOSIS — E118 Type 2 diabetes mellitus with unspecified complications: Secondary | ICD-10-CM | POA: Insufficient documentation

## 2018-10-11 DIAGNOSIS — Z1159 Encounter for screening for other viral diseases: Secondary | ICD-10-CM

## 2018-10-11 MED ORDER — SITAGLIPTIN PHOSPHATE 50 MG PO TABS: 50.0000 mg | ORAL_TABLET | Freq: Every day | ORAL | 3 refills | Status: DC

## 2018-10-11 NOTE — Progress Notes (Signed)
Chief Complaint  Patient presents with  . Follow-up    3-4 mo ROV - Pt c/o vaginal itching since taking the Jardiance - not as bad as it was when she was on Farxiga. Pt reports having higher than normal BS readings in AM than in PM, Mornings ranging 150s-180s fasting.    F/u  1. HTN elevated at home 133/82 when she comes to MD appts BP high had hctz 25 mg qd and losartan 100 mg qd  2. DM 2 a1c 6.9 on metformin 500 in am and 1000 qpm and jardiance but c/o vaginal itching with jardiance and cbgs 150s-180s but has not been good about diet control. She used otc Vagisil which helped  3. Right cheek, mandible lesion s/p bx Monday with Hassan Rowan pending pathology results    Review of Systems  Constitutional: Positive for weight loss.       Down 5 lbs    HENT: Negative for hearing loss.   Eyes: Negative for blurred vision.  Respiratory: Negative for shortness of breath.   Cardiovascular: Negative for chest pain.  Gastrointestinal: Negative for abdominal pain.  Genitourinary:       +ext vaginal skin itching/irritation    Musculoskeletal: Negative for falls.  Skin: Negative for rash.  Neurological: Negative for headaches.  Psychiatric/Behavioral: Negative for depression and memory loss.   Past Medical History:  Diagnosis Date  . Arthritis   . Cancer [facility])    right breast cancer 2000 lumpectomy 8 rounds chemo, 6 weeks radiation mammo 02/24/17 negative on Arimedex x 7 years   . Diabetes mellitus without complication ([place])   . History of chicken pox   . Hyperlipidemia   . Hypertension   . Personal history of chemotherapy 2000   Rt. Breast  . Personal history of radiation therapy 2000   Rt. breast  . UTI (urinary tract infection)    Past Surgical History:  Procedure Laterality Date  . BREAST EXCISIONAL BIOPSY Right 2000   +  . BREAST SURGERY     2000  . WRIST SURGERY     Family History  Problem Relation Age of Onset  . Cancer Mother        colon age 58  . Heart disease Father    . Stroke Father   . Arthritis Brother   . Diabetes Brother   . Cancer Maternal Grandmother        breast   . Breast cancer Maternal Grandmother   . Heart disease Maternal Grandfather   . Arthritis Brother   . Alcohol abuse Son   . Hypertension Son    Social History   Socioeconomic History  . Marital status: Married    Spouse name: Not on file  . Number of children: Not on file  . Years of education: Not on file  . Highest education level: Not on file  Occupational History  . Not on file  Social Needs  . Financial resource strain: Not on file  . Food insecurity:    Worry: Not on file    Inability: Not on file  . Transportation needs:    Medical: Not on file    Non-medical: Not on file  Tobacco Use  . Smoking status: Never Smoker  . Smokeless tobacco: Never Used  Substance and Sexual Activity  . Alcohol use: Yes  . Drug use: Not Currently  . Sexual activity: Yes    Partners: Male  Lifestyle  . Physical activity:    Days per week: Not on file  Minutes per session: Not on file  . Stress: Not on file  Relationships  . Social connections:    Talks on phone: Not on file    Gets together: Not on file    Attends religious service: Not on file    Active member of club or organization: Not on file    Attends meetings of clubs or organizations: Not on file    Relationship status: Not on file  . Intimate partner violence:    Fear of current or ex partner: Not on file    Emotionally abused: Not on file    Physically abused: Not on file    Forced sexual activity: Not on file  Other Topics Concern  . 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 aspirin EC 81 MG tablet Take 81 mg by  mouth daily.  . empagliflozin (JARDIANCE) 10 MG TABS tablet Take 10 mg by mouth daily.  Marland Kitchen glucose blood test strip Use as instructed E11.9 One touch ultra blue test strip in am  . hydrochlorothiazide (HYDRODIURIL) 25 MG tablet Take 1 tablet (25 mg total) by mouth daily. In am  . losartan (COZAAR) 100 MG tablet Take 1 tablet (100 mg total) by mouth daily.  . meloxicam (MOBIC) 15 MG tablet Take 15 mg by mouth daily.  . metFORMIN (GLUCOPHAGE-XR) 500 MG 24 hr tablet 1TAB in the morning and 2 TAB in the evening.  . Multiple Vitamin (MULTI-VITAMIN DAILY PO) Take by mouth.  . Omega-3 Fatty Acids (FISH OIL PO) Take by mouth.  . rosuvastatin (CRESTOR) 10 MG tablet Take 1 tablet (10 mg total) by mouth at bedtime.  . Vitamin D, Cholecalciferol, 1000 units CAPS Take 2,000 Int'l Units/day by mouth.   . zolpidem (AMBIEN) 5 MG tablet Take 1 tablet (5 mg total) by mouth at bedtime as needed for sleep.   No Known Allergies No results found for this or any previous visit (from the past 2160 hour(s)). Objective  Body mass index is 23.58 kg/m. Wt Readings from Last 3 Encounters:  10/11/18 157 lb 6.4 oz (71.4 kg)  06/13/18 162 lb 3.2 oz (73.6 kg)  03/16/18 160 lb 9.6 oz (72.8 kg)   Temp Readings from Last 3 Encounters:  10/11/18 97.7 F (36.5 C) (Oral)  06/13/18 98.3 F (36.8 C) (Oral)  03/16/18 98.9 F (37.2 C) (Oral)   BP Readings from Last 3 Encounters:  10/11/18 (!) 148/86  06/13/18 (!) 152/88  03/16/18 (!) 152/86   Pulse Readings from Last 3 Encounters:  10/11/18 82  06/13/18 96  03/16/18 98    Physical Exam Vitals signs reviewed.  Constitutional:      Appearance: Normal appearance. She is well-developed.  HENT:     Head: Normocephalic and atraumatic.     Mouth/Throat:     Mouth: Mucous membranes are moist.     Pharynx: Oropharynx is clear.  Eyes:     Conjunctiva/sclera: Conjunctivae normal.     Pupils: Pupils are equal, round, and reactive to light.  Cardiovascular:     Rate  and Rhythm: Normal rate and regular rhythm.  Heart sounds: Normal heart sounds. No murmur.  Pulmonary:     Effort: Pulmonary effort is normal.     Breath sounds: Normal breath sounds.  Skin:    General: Skin is warm and dry.  Neurological:     General: No focal deficit present.     Mental Status: She is alert and oriented to person, place, and time.     Gait: Gait normal.  Psychiatric:        Attention and Perception: Attention and perception normal.        Mood and Affect: Mood and affect normal.        Speech: Speech normal.        Behavior: Behavior normal. Behavior is cooperative.        Thought Content: Thought content normal.        Cognition and Memory: Cognition and memory normal.        Judgment: Judgment normal.     Assessment   1. HTN  2. DM 2 A1C 6.9  3. H/o BCC left leg s/p bx right cheek, mandible 10/09/2018  4. HM Plan   1. Cont meds  Log BP  If BP still elevated consider norvasc 2.5 mg qd goal <130/>80  2. Cont metformin  D/c jardiance 10 due to vaginal irritation likely yeast change back to jardiance 50 mg qd  Foot exam at f/u  Eye exam due 05/2019  3. Call back with pathology report [facility] dermatology  4.  Flu shot utd  prevnar had 02/03/17  pna 23 utd   Rx shingrix in past  Tdap had 2017 per pt will see if can find proof prior PCP   Consider check MMR, hep B with upcomign labs  -Hep C negative in 2019 x 1 per pt no proof   Requested Records labs, vaccines,pap 2015 and colonoscopy last PCPper pt had 05/2014 normal mom +colon cancer due 05/2019 DEXA 03/16/17 normal scanned in chart  Mammogram 04/17/18 negative  Out of age window pap no records from prior PCP  Est. Dermatology Dr. Kellie Moor h/o NMSC   Fasting labs comprehensive asap Sign release at f/u to get hep C record and Tdap record from out of state Dr.   Provider: Dr. Olivia Mackie McLean-Scocuzza-Internal Medicine

## 2018-10-11 NOTE — Patient Instructions (Addendum)
Try Cinnamon 500 mg capsules 1-2 per day  Stop Jardiance 10 and resume Januvia 50 mg daily  -download coupon for Januvia to see if this helps with cost   Monitor blood pressure at home and write it down goal <130/<80   Diabetes Mellitus and Nutrition, Adult When you have diabetes (diabetes mellitus), it is very important to have healthy eating habits because your blood sugar (glucose) levels are greatly affected by what you eat and drink. Eating healthy foods in the appropriate amounts, at about the same times every day, can help you:  Control your blood glucose.  Lower your risk of heart disease.  Improve your blood pressure.  Reach or maintain a healthy weight. Every person with diabetes is different, and each person has different needs for a meal plan. Your health care provider may recommend that you work with a diet and nutrition specialist (dietitian) to make a meal plan that is best for you. Your meal plan may vary depending on factors such as:  The calories you need.  The medicines you take.  Your weight.  Your blood glucose, blood pressure, and cholesterol levels.  Your activity level.  Other health conditions you have, such as heart or kidney disease. How do carbohydrates affect me? Carbohydrates, also called carbs, affect your blood glucose level more than any other type of food. Eating carbs naturally raises the amount of glucose in your blood. Carb counting is a method for keeping track of how many carbs you eat. Counting carbs is important to keep your blood glucose at a healthy level, especially if you use insulin or take certain oral diabetes medicines. It is important to know how many carbs you can safely have in each meal. This is different for every person. Your dietitian can help you calculate how many carbs you should have at each meal and for each snack. Foods that contain carbs include:  Bread, cereal, rice, pasta, and crackers.  Potatoes and corn.  Peas,  beans, and lentils.  Milk and yogurt.  Fruit and juice.  Desserts, such as cakes, cookies, ice cream, and candy. How does alcohol affect me? Alcohol can cause a sudden decrease in blood glucose (hypoglycemia), especially if you use insulin or take certain oral diabetes medicines. Hypoglycemia can be a life-threatening condition. Symptoms of hypoglycemia (sleepiness, dizziness, and confusion) are similar to symptoms of having too much alcohol. If your health care provider says that alcohol is safe for you, follow these guidelines:  Limit alcohol intake to no more than 1 drink per day for nonpregnant women and 2 drinks per day for men. One drink equals 12 oz of beer, 5 oz of wine, or 1 oz of hard liquor.  Do not drink on an empty stomach.  Keep yourself hydrated with water, diet soda, or unsweetened iced tea.  Keep in mind that regular soda, juice, and other mixers may contain a lot of sugar and must be counted as carbs. What are tips for following this plan?  Reading food labels  Start by checking the serving size on the "Nutrition Facts" label of packaged foods and drinks. The amount of calories, carbs, fats, and other nutrients listed on the label is based on one serving of the item. Many items contain more than one serving per package.  Check the total grams (g) of carbs in one serving. You can calculate the number of servings of carbs in one serving by dividing the total carbs by 15. For example, if a food has  30 g of total carbs, it would be equal to 2 servings of carbs.  Check the number of grams (g) of saturated and trans fats in one serving. Choose foods that have low or no amount of these fats.  Check the number of milligrams (mg) of salt (sodium) in one serving. Most people should limit total sodium intake to less than 2,300 mg per day.  Always check the nutrition information of foods labeled as "low-fat" or "nonfat". These foods may be higher in added sugar or refined carbs  and should be avoided.  Talk to your dietitian to identify your daily goals for nutrients listed on the label. Shopping  Avoid buying canned, premade, or processed foods. These foods tend to be high in fat, sodium, and added sugar.  Shop around the outside edge of the grocery store. This includes fresh fruits and vegetables, bulk grains, fresh meats, and fresh dairy. Cooking  Use low-heat cooking methods, such as baking, instead of high-heat cooking methods like deep frying.  Cook using healthy oils, such as olive, canola, or sunflower oil.  Avoid cooking with butter, cream, or high-fat meats. Meal planning  Eat meals and snacks regularly, preferably at the same times every day. Avoid going long periods of time without eating.  Eat foods high in fiber, such as fresh fruits, vegetables, beans, and whole grains. Talk to your dietitian about how many servings of carbs you can eat at each meal.  Eat 4-6 ounces (oz) of lean protein each day, such as lean meat, chicken, fish, eggs, or tofu. One oz of lean protein is equal to: ? 1 oz of meat, chicken, or fish. ? 1 egg. ?  cup of tofu.  Eat some foods each day that contain healthy fats, such as avocado, nuts, seeds, and fish. Lifestyle  Check your blood glucose regularly.  Exercise regularly as told by your health care provider. This may include: ? 150 minutes of moderate-intensity or vigorous-intensity exercise each week. This could be brisk walking, biking, or water aerobics. ? Stretching and doing strength exercises, such as yoga or weightlifting, at least 2 times a week.  Take medicines as told by your health care provider.  Do not use any products that contain nicotine or tobacco, such as cigarettes and e-cigarettes. If you need help quitting, ask your health care provider.  Work with a Social worker or diabetes educator to identify strategies to manage stress and any emotional and social challenges. Questions to ask a health care  provider  Do I need to meet with a diabetes educator?  Do I need to meet with a dietitian?  What number can I call if I have questions?  When are the best times to check my blood glucose? Where to find more information:  American Diabetes Association: diabetes.org  Academy of Nutrition and Dietetics: www.eatright.[employer] of Diabetes and Digestive and Kidney Diseases (NIH): [employer] Summary  A healthy meal plan will help you control your blood glucose and maintain a healthy lifestyle.  Working with a diet and nutrition specialist (dietitian) can help you make a meal plan that is best for you.  Keep in mind that carbohydrates (carbs) and alcohol have immediate effects on your blood glucose levels. It is important to count carbs and to use alcohol carefully. This information is not intended to replace advice given to you by your health care provider. Make sure you discuss any questions you have with your health care provider. Document Released: 06/17/2005 Document Revised: 04/20/2017 Document  Reviewed: 10/25/2016 Elsevier Interactive Patient Education  [employer].

## 2018-10-19 ENCOUNTER — Other Ambulatory Visit (INDEPENDENT_AMBULATORY_CARE_PROVIDER_SITE_OTHER): Payer: 59

## 2018-10-19 DIAGNOSIS — E119 Type 2 diabetes mellitus without complications: Secondary | ICD-10-CM

## 2018-10-19 DIAGNOSIS — Z1329 Encounter for screening for other suspected endocrine disorder: Secondary | ICD-10-CM

## 2018-10-19 DIAGNOSIS — I10 Essential (primary) hypertension: Secondary | ICD-10-CM | POA: Diagnosis not present

## 2018-10-19 DIAGNOSIS — Z0184 Encounter for antibody response examination: Secondary | ICD-10-CM

## 2018-10-19 DIAGNOSIS — Z1159 Encounter for screening for other viral diseases: Secondary | ICD-10-CM

## 2018-10-19 NOTE — Addendum Note (Signed)
Addended by: Arby Barrette on: 10/19/2018 08:59 AM   Modules accepted: Orders

## 2018-10-20 LAB — COMPREHENSIVE METABOLIC PANEL
AG Ratio: 2 (calc) (ref 1.0–2.5)
ALT: 35 U/L — ABNORMAL HIGH (ref 6–29)
AST: 21 U/L (ref 10–35)
Albumin: 4.8 g/dL (ref 3.6–5.1)
Alkaline phosphatase (APISO): 49 U/L (ref 33–130)
BUN: 16 mg/dL (ref 7–25)
CO2: 23 mmol/L (ref 20–32)
Calcium: 10.5 mg/dL — ABNORMAL HIGH (ref 8.6–10.4)
Chloride: 100 mmol/L (ref 98–110)
Creat: 0.66 mg/dL (ref 0.50–0.99)
Globulin: 2.4 g/dL (calc) (ref 1.9–3.7)
Glucose, Bld: 164 mg/dL — ABNORMAL HIGH (ref 65–99)
Potassium: 4.8 mmol/L (ref 3.5–5.3)
Sodium: 140 mmol/L (ref 135–146)
Total Bilirubin: 0.4 mg/dL (ref 0.2–1.2)
Total Protein: 7.2 g/dL (ref 6.1–8.1)

## 2018-10-20 LAB — HEMOGLOBIN A1C
Hgb A1c MFr Bld: 7.8 % of total Hgb — ABNORMAL HIGH (ref <5.7)
Mean Plasma Glucose: 177 (calc)
eAG (mmol/L): 9.8 (calc)

## 2018-10-20 LAB — LIPID PANEL
Cholesterol: 118 mg/dL (ref <200)
HDL: 41 mg/dL — ABNORMAL LOW (ref >50)
LDL Cholesterol (Calc): 55 mg/dL (calc)
Non-HDL Cholesterol (Calc): 77 mg/dL (calc) (ref <130)
Total CHOL/HDL Ratio: 2.9 (calc) (ref <5.0)
Triglycerides: 138 mg/dL (ref <150)

## 2018-10-20 LAB — MEASLES/MUMPS/RUBELLA IMMUNITY
Mumps IgG: <9 AU/mL — ABNORMAL LOW
Rubella: 29.4 index
Rubeola IgG: >300 AU/mL

## 2018-10-20 LAB — CBC WITH DIFFERENTIAL/PLATELET
Absolute Monocytes: 729 cells/uL (ref 200–950)
Basophils Absolute: 49 cells/uL (ref 0–200)
Basophils Relative: 0.6 %
Eosinophils Absolute: 348 cells/uL (ref 15–500)
Eosinophils Relative: 4.3 %
HCT: 43.6 % (ref 35.0–45.0)
Hemoglobin: 15.1 g/dL (ref 11.7–15.5)
Lymphs Abs: 3985 cells/uL — ABNORMAL HIGH (ref 850–3900)
MCH: 28.5 pg (ref 27.0–33.0)
MCHC: 34.6 g/dL (ref 32.0–36.0)
MCV: 82.4 fL (ref 80.0–100.0)
MPV: 9.5 fL (ref 7.5–12.5)
Monocytes Relative: 9 %
Neutro Abs: 2989 cells/uL (ref 1500–7800)
Neutrophils Relative %: 36.9 %
Platelets: 256 10*3/uL (ref 140–400)
RBC: 5.29 10*6/uL — ABNORMAL HIGH (ref 3.80–5.10)
RDW: 13.2 % (ref 11.0–15.0)
Total Lymphocyte: 49.2 %
WBC: 8.1 10*3/uL (ref 3.8–10.8)

## 2018-10-20 LAB — TSH: TSH: 1.83 mIU/L (ref 0.40–4.50)

## 2018-10-20 LAB — HEPATITIS B SURFACE ANTIBODY, QUANTITATIVE: Hep B S AB Quant (Post): <5 m[IU]/mL — ABNORMAL LOW (ref >=10)

## 2018-10-20 LAB — VITAMIN D 25 HYDROXY (VIT D DEFICIENCY, FRACTURES): Vit D, 25-Hydroxy: 51 ng/mL (ref 30–100)

## 2018-10-23 ENCOUNTER — Other Ambulatory Visit: Payer: Self-pay | Admitting: Internal Medicine

## 2018-10-23 DIAGNOSIS — R7989 Other specified abnormal findings of blood chemistry: Secondary | ICD-10-CM

## 2018-10-23 DIAGNOSIS — R945 Abnormal results of liver function studies: Principal | ICD-10-CM

## 2018-11-03 ENCOUNTER — Ambulatory Visit: Payer: 59

## 2018-11-06 ENCOUNTER — Ambulatory Visit
Admission: RE | Admit: 2018-11-06 | Discharge: 2018-11-06 | Disposition: A | Discharge status: Home / Self Care | Payer: 59 | Source: Ambulatory Visit | Attending: Internal Medicine | Admitting: Internal Medicine

## 2018-11-06 ENCOUNTER — Encounter: Payer: Self-pay | Admitting: *Deleted

## 2018-11-06 DIAGNOSIS — R7989 Other specified abnormal findings of blood chemistry: Secondary | ICD-10-CM

## 2018-11-06 DIAGNOSIS — R945 Abnormal results of liver function studies: Secondary | ICD-10-CM | POA: Insufficient documentation

## 2018-11-06 DIAGNOSIS — K8020 Calculus of gallbladder without cholecystitis without obstruction: Secondary | ICD-10-CM | POA: Diagnosis not present

## 2018-11-06 DIAGNOSIS — K760 Fatty (change of) liver, not elsewhere classified: Secondary | ICD-10-CM | POA: Diagnosis not present

## 2018-11-10 NOTE — Telephone Encounter (Signed)
Unread mychart message mailed to patient 

## 2018-12-03 ENCOUNTER — Other Ambulatory Visit: Payer: Self-pay | Admitting: Internal Medicine

## 2018-12-03 DIAGNOSIS — E1165 Type 2 diabetes mellitus with hyperglycemia: Secondary | ICD-10-CM

## 2018-12-03 MED ORDER — METFORMIN HCL ER 500 MG PO TB24: ORAL_TABLET | ORAL | 3 refills | Status: DC

## 2018-12-29 ENCOUNTER — Telehealth: Payer: Self-pay | Admitting: Internal Medicine

## 2018-12-29 NOTE — Telephone Encounter (Signed)
Copied from [place] [id number]. Topic: Quick Communication - Rx Refill/Question >> Dec 29, 2018  1:19 PM Selinda Flavin B, NT wrote: Medication: zolpidem (AMBIEN) 5 MG tablet  Has the patient contacted their pharmacy? yes (Agent: If no, request that the patient contact the pharmacy for the refill.) (Agent: If yes, when and what did the pharmacy advise?)  Preferred Pharmacy (with phone number or street name): [facility], [place]: Please be advised that RX refills may take up to 3 business days. We ask that you follow-up with your pharmacy.

## 2019-01-01 ENCOUNTER — Other Ambulatory Visit: Payer: Self-pay | Admitting: Internal Medicine

## 2019-01-01 DIAGNOSIS — G4700 Insomnia, unspecified: Secondary | ICD-10-CM

## 2019-01-01 MED ORDER — ZOLPIDEM TARTRATE 5 MG PO TABS: 5.0000 mg | ORAL_TABLET | Freq: Every evening | ORAL | 1 refills | Status: DC | PRN

## 2019-01-01 NOTE — Telephone Encounter (Signed)
Sent ambien to [facility] order   [facility]

## 2019-01-10 ENCOUNTER — Ambulatory Visit: Payer: 59 | Admitting: Internal Medicine

## 2019-02-16 ENCOUNTER — Other Ambulatory Visit: Payer: Self-pay | Admitting: Internal Medicine

## 2019-02-16 DIAGNOSIS — E1165 Type 2 diabetes mellitus with hyperglycemia: Secondary | ICD-10-CM

## 2019-02-16 DIAGNOSIS — I10 Essential (primary) hypertension: Secondary | ICD-10-CM

## 2019-02-16 MED ORDER — ROSUVASTATIN CALCIUM 10 MG PO TABS: 10.0000 mg | ORAL_TABLET | Freq: Every day | ORAL | 3 refills | Status: DC

## 2019-02-16 MED ORDER — LOSARTAN POTASSIUM 100 MG PO TABS: 100.0000 mg | ORAL_TABLET | Freq: Every day | ORAL | 3 refills | Status: DC

## 2019-02-16 MED ORDER — METFORMIN HCL ER 500 MG PO TB24: ORAL_TABLET | ORAL | 3 refills | Status: DC

## 2019-03-07 ENCOUNTER — Ambulatory Visit: Payer: 59 | Admitting: Internal Medicine

## 2019-03-28 ENCOUNTER — Other Ambulatory Visit: Payer: Self-pay

## 2019-03-28 ENCOUNTER — Ambulatory Visit (INDEPENDENT_AMBULATORY_CARE_PROVIDER_SITE_OTHER): Payer: Medicare Other | Admitting: Internal Medicine

## 2019-03-28 DIAGNOSIS — Z1231 Encounter for screening mammogram for malignant neoplasm of breast: Secondary | ICD-10-CM

## 2019-03-28 DIAGNOSIS — E1165 Type 2 diabetes mellitus with hyperglycemia: Secondary | ICD-10-CM | POA: Diagnosis not present

## 2019-03-28 DIAGNOSIS — K760 Fatty (change of) liver, not elsewhere classified: Secondary | ICD-10-CM | POA: Diagnosis not present

## 2019-03-28 DIAGNOSIS — E119 Type 2 diabetes mellitus without complications: Secondary | ICD-10-CM | POA: Diagnosis not present

## 2019-03-28 DIAGNOSIS — I10 Essential (primary) hypertension: Secondary | ICD-10-CM

## 2019-03-28 DIAGNOSIS — K8020 Calculus of gallbladder without cholecystitis without obstruction: Secondary | ICD-10-CM | POA: Diagnosis not present

## 2019-03-28 DIAGNOSIS — Z853 Personal history of malignant neoplasm of breast: Secondary | ICD-10-CM

## 2019-03-28 MED ORDER — METFORMIN HCL 1000 MG PO TABS: 1000.0000 mg | ORAL_TABLET | Freq: Two times a day (BID) | ORAL | 3 refills | Status: DC

## 2019-03-28 MED ORDER — SITAGLIPTIN PHOSPHATE 100 MG PO TABS: 100.0000 mg | ORAL_TABLET | Freq: Every day | ORAL | 3 refills | Status: DC

## 2019-03-28 MED ORDER — METFORMIN HCL 1000 MG PO TABS: 1000.0000 mg | ORAL_TABLET | Freq: Two times a day (BID) | ORAL | 0 refills | Status: DC

## 2019-03-28 NOTE — Patient Instructions (Signed)
Hyperglycemia Hyperglycemia occurs when the level of sugar (glucose) in the blood is too high. Glucose is a type of sugar that provides the body's main source of energy. Certain hormones (insulin and glucagon) control the level of glucose in the blood. Insulin lowers blood glucose, and glucagon increases blood glucose. Hyperglycemia can result from having too little insulin in the bloodstream, or from the body not responding normally to insulin. Hyperglycemia occurs most often in people who have diabetes (diabetes mellitus), but it can happen in people who do not have diabetes. It can develop quickly, and it can be life-threatening if it causes you to become severely dehydrated (diabetic ketoacidosis or hyperglycemic hyperosmolar state). Severe hyperglycemia is a medical emergency. What are the causes? If you have diabetes, hyperglycemia may be caused by:  Diabetes medicine.  Medicines that increase blood glucose or affect your diabetes control.  Not eating enough, or not eating often enough.  Changes in physical activity level.  Being sick or having an infection. If you have prediabetes or undiagnosed diabetes:  Hyperglycemia may be caused by those conditions. If you do not have diabetes, hyperglycemia may be caused by:  Certain medicines, including steroid medicines, beta-blockers, epinephrine, and thiazide diuretics.  Stress.  Serious illness.  Surgery.  Diseases of the pancreas.  Infection. What increases the risk? Hyperglycemia is more likely to develop in people who have risk factors for diabetes, such as:  Having a family member with diabetes.  Having a gene for type 1 diabetes that is passed from parent to child (inherited).  Living in an area with cold weather conditions.  Exposure to certain viruses.  Certain conditions in which the body's disease-fighting (immune) system attacks itself (autoimmune disorders).  Being overweight or obese.  Having an inactive  (sedentary) lifestyle.  Having been diagnosed with insulin resistance.  Having a history of prediabetes, gestational diabetes, or polycystic ovarian syndrome (PCOS).  Being of American-Indian, African-American, Hispanic/Latino, or Asian/Pacific Islander descent. What are the signs or symptoms? Hyperglycemia may not cause any symptoms. If you do have symptoms, they may include early warning signs, such as:  Increased thirst.  Hunger.  Feeling very tired.  Needing to urinate more often than usual.  Blurry vision. Other symptoms may develop if hyperglycemia gets worse, such as:  Dry mouth.  Loss of appetite.  Fruity-smelling breath.  Weakness.  Unexpected or rapid weight gain or weight loss.  Tingling or numbness in the hands or feet.  Headache.  Skin that does not quickly return to normal after being lightly pinched and released (poor skin turgor).  Abdominal pain.  Cuts or bruises that are slow to heal. How is this diagnosed? Hyperglycemia is diagnosed with a blood test to measure your blood glucose level. This blood test is usually done while you are having symptoms. Your health care provider may also do a physical exam and review your medical history. You may have more tests to determine the cause of your hyperglycemia, such as:  A fasting blood glucose (FBG) test. You will not be allowed to eat (you will fast) for at least 8 hours before a blood sample is taken.  An A1c (hemoglobin A1c) blood test. This provides information about blood glucose control over the previous 2-3 months.  An oral glucose tolerance test (OGTT). This measures your blood glucose at two times: ? After fasting. This is your baseline blood glucose level. ? Two hours after drinking a beverage that contains glucose. How is this treated? Treatment depends on the cause  of your hyperglycemia. Treatment may include:  Taking medicine to regulate your blood glucose levels. If you take insulin or  other diabetes medicines, your medicine or dosage may be adjusted.  Lifestyle changes, such as exercising more, eating healthier foods, or losing weight.  Treating an illness or infection, if this caused your hyperglycemia.  Checking your blood glucose more often.  Stopping or reducing steroid medicines, if these caused your hyperglycemia. If your hyperglycemia becomes severe and it results in hyperglycemic hyperosmolar state, you must be hospitalized and given IV fluids. Follow these instructions at home:  General instructions  Take over-the-counter and prescription medicines only as told by your health care provider.  Do not use any products that contain nicotine or tobacco, such as cigarettes and e-cigarettes. If you need help quitting, ask your health care provider.  Limit alcohol intake to no more than 1 drink per day for nonpregnant women and 2 drinks per day for men. One drink equals 12 oz of beer, 5 oz of wine, or 1 oz of hard liquor.  Learn to manage stress. If you need help with this, ask your health care provider.  Keep all follow-up visits as told by your health care provider. This is important. Eating and drinking   Maintain a healthy weight.  Exercise regularly, as directed by your health care provider.  Stay hydrated, especially when you exercise, get sick, or spend time in hot temperatures.  Eat healthy foods, such as: ? Lean proteins. ? Complex carbohydrates. ? Fresh fruits and vegetables. ? Low-fat dairy products. ? Healthy fats.  Drink enough fluid to keep your urine clear or pale yellow. If you have diabetes:  Make sure you know the symptoms of hyperglycemia.  Follow your diabetes management plan, as told by your health care provider. Make sure you: ? Take your insulin and medicines as directed. ? Follow your exercise plan. ? Follow your meal plan. Eat on time, and do not skip meals. ? Check your blood glucose as often as directed. Make sure to  check your blood glucose before and after exercise. If you exercise longer or in a different way than usual, check your blood glucose more often. ? Follow your sick day plan whenever you cannot eat or drink normally. Make this plan in advance with your health care provider.  Share your diabetes management plan with people in your workplace, school, and household.  Check your urine for ketones when you are ill and as told by your health care provider.  Carry a medical alert card or wear medical alert jewelry. Contact a health care provider if:  Your blood glucose is at or above 240 mg/dL (13.3 mmol/L) for 2 days in a row.  You have problems keeping your blood glucose in your target range.  You have frequent episodes of hyperglycemia. Get help right away if:  You have difficulty breathing.  You have a change in how you think, feel, or act (mental status).  You have nausea or vomiting that does not go away. These symptoms may represent a serious problem that is an emergency. Do not wait to see if the symptoms will go away. Get medical help right away. Call your local emergency services (911 in the [place] Do not drive yourself to the hospital. Summary  Hyperglycemia occurs when the level of sugar (glucose) in the blood is too high.  Hyperglycemia is diagnosed with a blood test to measure your blood glucose level. This blood test is usually done while you are  having symptoms. Your health care provider may also do a physical exam and review your medical history.  If you have diabetes, follow your diabetes management plan as told by your health care provider.  Contact your health care provider if you have problems keeping your blood glucose in your target range. This information is not intended to replace advice given to you by your health care provider. Make sure you discuss any questions you have with your health care provider. Document Released: 03/16/2001 Document Revised: 06/07/2016  Document Reviewed: 06/07/2016 Elsevier Interactive Patient Education  2019 [employer]  Nonalcoholic Fatty Liver Disease Diet Nonalcoholic fatty liver disease is a condition that causes fat to accumulate in and around the liver. The disease makes it harder for the liver to work the way that it should. Following a healthy diet can help to keep nonalcoholic fatty liver disease under control. It can also help to prevent or improve conditions that are associated with the disease, such as heart disease, diabetes, high blood pressure, and abnormal cholesterol levels. Along with regular exercise, this diet:  Promotes weight loss.  Helps to control blood sugar levels.  Helps to improve the way that the body uses insulin. What do I need to know about this diet?  Use the glycemic index (GI) to plan your meals. The index tells you how quickly a food will raise your blood sugar. Choose low-GI foods. These foods take a longer time to raise blood sugar.  Keep track of how many calories you take in. Eating the right amount of calories will help you to achieve a healthy weight.  You may want to follow a Mediterranean diet. This diet includes a lot of vegetables, lean meats or fish, whole grains, fruits, and healthy oils and fats. What foods can I eat? Grains Whole grains, such as whole-wheat or whole-grain breads, crackers, tortillas, cereals, and pasta. Stone-ground whole wheat. Pumpernickel bread. Unsweetened oatmeal. Bulgur. Barley. Quinoa. Brown or wild rice. Corn or whole-wheat flour tortillas. Vegetables Lettuce. Spinach. Peas. Beets. Cauliflower. Cabbage. Broccoli. Carrots. Tomatoes. Squash. Eggplant. Herbs. Peppers. Onions. Cucumbers. Brussels sprouts. Yams and sweet potatoes. Beans. Lentils. Fruits Bananas. Apples. Oranges. Grapes. Papaya. Mango. Pomegranate. Kiwi. Grapefruit. Cherries. Meats and Other Protein Sources Seafood and shellfish. Lean meats. Poultry. Tofu. Dairy Low-fat or fat-free  dairy products, such as yogurt, cottage cheese, and cheese. Beverages Water. Sugar-free drinks. Tea. Coffee. Low-fat or skim milk. Milk alternatives, such as soy or almond milk. Real fruit juice. Condiments Mustard. Relish. Low-fat, low-sugar ketchup and barbecue sauce. Low-fat or fat-free mayonnaise. Sweets and Desserts Sugar-free sweets. Fats and Oils Avocado. Canola or olive oil. Nuts and nut butters. Seeds. The items listed above may not be a complete list of recommended foods or beverages. Contact your dietitian for more options. What foods are not recommended? Palm oil and coconut oil. Processed foods. Fried foods. Sweetened drinks, such as sweet tea, milkshakes, snow cones, iced sweet drinks, and sodas. Alcohol. Sweets. Foods that contain a lot of salt or sodium. The items listed above may not be a complete list of foods and beverages to avoid. Contact your dietitian for more information. This information is not intended to replace advice given to you by your health care provider. Make sure you discuss any questions you have with your health care provider. Document Released: 02/04/2015 Document Revised: 02/26/2016 Document Reviewed: 10/15/2014 Elsevier Interactive Patient Education  2019 [employer]  Fatty Liver Disease  Fatty liver disease occurs when too much fat has built up in your  liver cells. Fatty liver disease is also called hepatic steatosis or steatohepatitis. The liver removes harmful substances from your bloodstream and produces fluids that your body needs. It also helps your body use and store energy from the food you eat. In many cases, fatty liver disease does not cause symptoms or problems. It is often diagnosed when tests are being done for other reasons. However, over time, fatty liver can cause inflammation that may lead to more serious liver problems, such as scarring of the liver (cirrhosis) and liver failure. Fatty liver is associated with insulin resistance,  increased body fat, high blood pressure (hypertension), and high cholesterol. These are features of metabolic syndrome and increase your risk for stroke, diabetes, and heart disease. What are the causes? This condition may be caused by:  Drinking too much alcohol.  Poor nutrition.  Obesity.  Cushing's syndrome.  Diabetes.  High cholesterol.  Certain drugs.  Poisons.  Some viral infections.  Pregnancy. What increases the risk? You are more likely to develop this condition if you:  Abuse alcohol.  Are overweight.  Have diabetes.  Have hepatitis.  Have a high triglyceride level.  Are pregnant. What are the signs or symptoms? Fatty liver disease often does not cause symptoms. If symptoms do develop, they can include:  Fatigue.  Weakness.  Weight loss.  Confusion.  Abdominal pain.  Nausea and vomiting.  Yellowing of your skin and the white parts of your eyes (jaundice).  Itchy skin. How is this diagnosed? This condition may be diagnosed by:  A physical exam and medical history.  Blood tests.  Imaging tests, such as an ultrasound, CT scan, or MRI.  A liver biopsy. A small sample of liver tissue is removed using a needle. The sample is then looked at under a microscope. How is this treated? Fatty liver disease is often caused by other health conditions. Treatment for fatty liver may involve medicines and lifestyle changes to manage conditions such as:  Alcoholism.  High cholesterol.  Diabetes.  Being overweight or obese. Follow these instructions at home:   Do not drink alcohol. If you have trouble quitting, ask your health care provider how to safely quit with the help of medicine or a supervised program. This is important to keep your condition from getting worse.  Eat a healthy diet as told by your health care provider. Ask your health care provider about working with a diet and nutrition specialist (dietitian) to develop an eating  plan.  Exercise regularly. This can help you lose weight and control your cholesterol and diabetes. Talk to your health care provider about an exercise plan and which activities are best for you.  Take over-the-counter and prescription medicines only as told by your health care provider.  Keep all follow-up visits as told by your health care provider. This is important. Contact a health care provider if: You have trouble controlling your:  Blood sugar. This is especially important if you have diabetes.  Cholesterol.  Drinking of alcohol. Get help right away if:  You have abdominal pain.  You have jaundice.  You have nausea and vomiting.  You vomit blood or material that looks like coffee grounds.  You have stools that are black, tar-like, or bloody. Summary  Fatty liver disease develops when too much fat builds up in the cells of your liver.  Fatty liver disease often causes no symptoms or problems. However, over time, fatty liver can cause inflammation that may lead to more serious liver problems, such as  scarring of the liver (cirrhosis).  You are more likely to develop this condition if you abuse alcohol, are pregnant, are overweight, have diabetes, have hepatitis, or have high triglyceride levels.  Contact your health care provider if you have trouble controlling your weight, blood sugar, cholesterol, or drinking of alcohol. This information is not intended to replace advice given to you by your health care provider. Make sure you discuss any questions you have with your health care provider. Document Released: 11/05/2005 Document Revised: 06/29/2017 Document Reviewed: 06/29/2017 Elsevier Interactive Patient Education  2019 [employer]  Hypercalcemia Hypercalcemia is having too much calcium in the blood. The body needs calcium to make bones and keep them strong. Calcium also helps the muscles, nerves, brain, and heart work the way they should. Most of the calcium in  the body is in the bones. There is also some calcium in the blood. Hypercalcemia can happen when calcium comes out of the bones, or when the kidneys are not able to remove calcium from the blood. Hypercalcemia can be mild or severe. What are the causes? There are many possible causes of hypercalcemia. Common causes include:  Hyperparathyroidism. This is a condition in which the body produces too much parathyroid hormone. There are four parathyroid glands in your neck. These glands produce a chemical messenger (hormone) that helps the body absorb calcium from foods and helps your bones release calcium.  Certain kinds of cancer, such as lung cancer, breast cancer, or myeloma. Less common causes of hypercalcemia include:  Getting too much calcium or vitamin D from your diet.  Kidney failure.  Hyperthyroidism.  Being on bed rest for a long time.  Certain medicines.  Infections.  Sarcoidosis. What increases the risk? This condition is more likely to develop in:  Women.  People who are 60 years or older.  People who have a family history of hypercalcemia. What are the signs or symptoms? Mild hypercalcemia that starts slowly may not cause symptoms. Severe, sudden hypercalcemia is more likely to cause symptoms, such as:  Loss of appetite.  Increased thirst and frequent urination.  Fatigue.  Nausea and vomiting.  Headache.  Abdominal pain.  Muscle pain, twitching, or weakness.  Constipation.  Blood in the urine.  Pain in the side of the back (flank pain).  Anxiety, confusion, or depression.  Irregular heartbeat (arrhythmia).  Loss of consciousness. How is this diagnosed? This condition may be diagnosed based on:  Your symptoms.  Blood tests.  Urine tests.  X-rays.  Ultrasound.  MRI.  CT scan. How is this treated? Treatment for hypercalcemia depends on the cause. Treatment may include:  Receiving fluids through an IV tube.  Medicines that keep  calcium levels steady after receiving fluids (loop diuretics).  Medicines that keep calcium in your bones (bisphosphonates).  Medicines that lower the calcium level in your blood.  Surgery to remove overactive parathyroid glands. Follow these instructions at home:  Take over-the-counter and prescription medicines only as told by your health care provider.  Follow instructions from your health care provider about eating or drinking restrictions.  Drink enough fluid to keep your urine clear or pale yellow.  Stay active. Weight-bearing exercise helps to keep calcium in your bones. Follow instructions from your health care provider about what type and level of exercise is safe for you.  Keep all follow-up visits as told by your health care provider. This is important. Contact a health care provider if:  You have a fever.  You have flank or abdominal  pain that is getting worse. Get help right away if:  You have severe abdominal or flank pain.  You have chest pain.  You have trouble breathing.  You become very confused and sleepy.  You lose consciousness. This information is not intended to replace advice given to you by your health care provider. Make sure you discuss any questions you have with your health care provider. Document Released: 12/04/2004 Document Revised: 02/26/2016 Document Reviewed: 02/05/2015 Elsevier Interactive Patient Education  2019 [employer].

## 2019-03-28 NOTE — Progress Notes (Addendum)
Virtual Visit via Video Note  I connected with Christy Stout   on 03/28/19 at  2:00 PM EDT by a video enabled telemedicine application and verified that I am speaking with the correct person using two identifiers.  Location patient: home Location provider:work Persons participating in the virtual visit: patient, provider, Pts husband   I discussed the limitations of evaluation and management by telemedicine and the availability of in person appointments. The patient expressed understanding and agreed to proceed.   HPI: 1. DM 2 with hyperglycemia last A1C was 7.8 on metformin ER 3 pills (reviewed this med was dc'ed ) daily and januvia 50 mg qd cbg in the am 200 and pm 176 after food  2. Reviewed [place] +fatty liver and gallstones w/o sxs'  3. H/o right breast cancer will order mammogram  4. HTN BP 138/82 before meds and 120-130s 70s to <80s after meds on losartan 100 mg qd and hctx 25 mg qd  5. Hypercalcemia 10.5 she stopped taking otc calcium and will repeat calcium      ROS: See pertinent positives and negatives per HPI.  Past Medical History:  Diagnosis Date  . Arthritis   . Cancer [facility])    right breast cancer 2000 lumpectomy 8 rounds chemo, 6 weeks radiation mammo 02/24/17 negative on Arimedex x 7 years   . Diabetes mellitus without complication ([place])   . History of chicken pox   . Hyperlipidemia   . Hypertension   . Personal history of chemotherapy 2000   Rt. Breast  . Personal history of radiation therapy 2000   Rt. breast  . UTI (urinary tract infection)     Past Surgical History:  Procedure Laterality Date  . BREAST EXCISIONAL BIOPSY Right 2000   +  . BREAST SURGERY     2000  . WRIST SURGERY      Family History  Problem Relation Age of Onset  . Cancer Mother        colon age 29  . Heart disease Father   . Stroke Father   . Arthritis Brother   . Diabetes Brother   . Cancer Maternal Grandmother        breast   . Breast cancer Maternal Grandmother   .  Heart disease Maternal Grandfather   . Arthritis Brother   . Alcohol abuse Son   . Hypertension Son     SOCIAL HX:  Married lives with husband    Current Outpatient Medications:  .  aspirin EC 81 MG tablet, Take 81 mg by mouth daily., Disp: , Rfl:  .  glucose blood test strip, Use as instructed E11.9 One touch ultra blue test strip in am, Disp: 100 each, Rfl: 3 .  hydrochlorothiazide (HYDRODIURIL) 25 MG tablet, Take 1 tablet (25 mg total) by mouth daily. In am, Disp: 90 tablet, Rfl: 2 .  losartan (COZAAR) 100 MG tablet, Take 1 tablet (100 mg total) by mouth daily., Disp: 90 tablet, Rfl: 3 .  meloxicam (MOBIC) 15 MG tablet, Take 15 mg by mouth daily., Disp: , Rfl:  .  Multiple Vitamin (MULTI-VITAMIN DAILY PO), Take by mouth., Disp: , Rfl:  .  Omega-3 Fatty Acids (FISH OIL PO), Take by mouth., Disp: , Rfl:  .  rosuvastatin (CRESTOR) 10 MG tablet, Take 1 tablet (10 mg total) by mouth at bedtime., Disp: 90 tablet, Rfl: 3 .  sitaGLIPtin (JANUVIA) 100 MG tablet, Take 1 tablet (100 mg total) by mouth daily., Disp: 90 tablet, Rfl: 3 .  Vitamin  D, Cholecalciferol, 1000 units CAPS, Take 2,000 Int'l Units/day by mouth. , Disp: , Rfl:  .  zolpidem (AMBIEN) 5 MG tablet, Take 1 tablet (5 mg total) by mouth at bedtime as needed for sleep., Disp: 90 tablet, Rfl: 1 .  metFORMIN (GLUCOPHAGE) 1000 MG tablet, Take 1 tablet (1,000 mg total) by mouth 2 (two) times daily with a meal., Disp: 180 tablet, Rfl: 3 .  metFORMIN (GLUCOPHAGE) 1000 MG tablet, Take 1 tablet (1,000 mg total) by mouth 2 (two) times daily with a meal., Disp: 60 tablet, Rfl: 0  EXAM:  VITALS per patient if applicable:  GENERAL: alert, oriented, appears well and in no acute distress  HEENT: atraumatic, conjunttiva clear, no obvious abnormalities on inspection of external nose and ears  NECK: normal movements of the head and neck  LUNGS: on inspection no signs of respiratory distress, breathing rate appears normal, no obvious gross SOB,  gasping or wheezing  CV: no obvious cyanosis  MS: moves all visible extremities without noticeable abnormality  PSYCH/NEURO: pleasant and cooperative, no obvious depression or anxiety, speech and thought processing grossly intact  ASSESSMENT AND PLAN:  Discussed the following assessment and plan:  Essential hypertension - Plan: hctz 25 mg qd and losartan 100 mg qd  Type 2 diabetes mellitus with hyperglycemia last A1C 7.8 - Plan: sitaGLIPtin (JANUVIA) 100 MG tablet increased from 50 mg qd, metFORMIN (GLUCOPHAGE) 1000 MG tablet bid with meals d/c ER/XR due to recall was taking 500 mg 1500 mg total per day -check cbg 3 x per day before breakfast, after lunch and dinner  Urine protein due 06/13/18  Ask about eye exam at f/u and do foot exam   History of right breast cancer - Plan:  Screening mammogram ordered [place]   Hypercalcemia  -repeat with labs pt stopped extra calcium  -if continues to be elevated continue further w/u   HM Flu shot utd  prevnar had 02/03/17 will confirm this as well as Tdap with prior PCP [facility] [place]  prevnar had 02/02/17  pna 23utd Rx shingrixin past Tdap 11/08/15  hep A/B with upcoming labs  -Hep C negative in 2019 x 1 per pt no proof try to get prior PCP  Hep C negative 12/17/16   RequestedRecords labs, vaccines,pap 2015 and colonoscopy last PCP cant find report but reports due in 02/2020   PCPper pt had 05/2014 normal mom +colon cancer due 05/2019 DEXA 03/16/17 normal scanned in chart  Mammogram7/15/19 negative reordered   Out of age window pap no records from prior PCP  Est. Dermatology Dr. Kellie Moor h/o NMSC    03/28/19 [facility] [address] and [place] [facility]   Called Judithe Modest NP [place] [phone] to confirm Tdap, hep C, prevnar, colonoscopy   I discussed the assessment and treatment plan with the patient. The patient was provided an opportunity  to ask questions and all were answered. The patient agreed with the plan and demonstrated an understanding of the instructions.   The patient was advised to call back or seek an in-person evaluation if the symptoms worsen or if the condition fails to improve as anticipated.  Time spent 25 minutes  Delorise Jackson, MD

## 2019-03-29 ENCOUNTER — Telehealth: Payer: Self-pay | Admitting: Internal Medicine

## 2019-03-29 NOTE — Telephone Encounter (Signed)
Call pt  Inform   prevnar had 02/02/17-not needed  Tdap 11/08/15  Colonoscopy due either 05/2019 or 2021 dud to covid lets hold off for now

## 2019-04-05 ENCOUNTER — Other Ambulatory Visit: Payer: 59

## 2019-04-18 ENCOUNTER — Other Ambulatory Visit: Payer: 59

## 2019-05-02 ENCOUNTER — Other Ambulatory Visit (INDEPENDENT_AMBULATORY_CARE_PROVIDER_SITE_OTHER): Payer: Medicare Other

## 2019-05-02 ENCOUNTER — Other Ambulatory Visit: Payer: Self-pay

## 2019-05-02 DIAGNOSIS — I10 Essential (primary) hypertension: Secondary | ICD-10-CM | POA: Diagnosis not present

## 2019-05-02 DIAGNOSIS — K760 Fatty (change of) liver, not elsewhere classified: Secondary | ICD-10-CM | POA: Diagnosis not present

## 2019-05-02 DIAGNOSIS — E1165 Type 2 diabetes mellitus with hyperglycemia: Secondary | ICD-10-CM | POA: Diagnosis not present

## 2019-05-02 DIAGNOSIS — E119 Type 2 diabetes mellitus without complications: Secondary | ICD-10-CM

## 2019-05-02 LAB — CBC WITH DIFFERENTIAL/PLATELET
Basophils Absolute: 0 10*3/uL (ref 0.0–0.1)
Basophils Relative: 0.8 % (ref 0.0–3.0)
Eosinophils Absolute: 0.2 10*3/uL (ref 0.0–0.7)
Eosinophils Relative: 3.3 % (ref 0.0–5.0)
HCT: 40.2 % (ref 36.0–46.0)
Hemoglobin: 13.6 g/dL (ref 12.0–15.0)
Lymphocytes Relative: 42.8 % (ref 12.0–46.0)
Lymphs Abs: 2.6 10*3/uL (ref 0.7–4.0)
MCHC: 33.8 g/dL (ref 30.0–36.0)
MCV: 85.1 fl (ref 78.0–100.0)
Monocytes Absolute: 0.6 10*3/uL (ref 0.1–1.0)
Monocytes Relative: 9.1 % (ref 3.0–12.0)
Neutro Abs: 2.7 10*3/uL (ref 1.4–7.7)
Neutrophils Relative %: 44 % (ref 43.0–77.0)
Platelets: 235 10*3/uL (ref 150.0–400.0)
RBC: 4.72 Mil/uL (ref 3.87–5.11)
RDW: 13.3 % (ref 11.5–15.5)
WBC: 6.1 10*3/uL (ref 4.0–10.5)

## 2019-05-02 LAB — COMPREHENSIVE METABOLIC PANEL
ALT: 51 U/L — ABNORMAL HIGH (ref 0–35)
AST: 36 U/L (ref 0–37)
Albumin: 5 g/dL (ref 3.5–5.2)
Alkaline Phosphatase: 41 U/L (ref 39–117)
BUN: 16 mg/dL (ref 6–23)
CO2: 29 mEq/L (ref 19–32)
Calcium: 10.1 mg/dL (ref 8.4–10.5)
Chloride: 97 mEq/L (ref 96–112)
Creatinine, Ser: 0.63 mg/dL (ref 0.40–1.20)
GFR: 93.9 mL/min (ref >60.00)
Glucose, Bld: 202 mg/dL — ABNORMAL HIGH (ref 70–99)
Potassium: 4.2 mEq/L (ref 3.5–5.1)
Sodium: 136 mEq/L (ref 135–145)
Total Bilirubin: 0.6 mg/dL (ref 0.2–1.2)
Total Protein: 7.2 g/dL (ref 6.0–8.3)

## 2019-05-02 LAB — HEMOGLOBIN A1C: Hgb A1c MFr Bld: 7.7 % — ABNORMAL HIGH (ref 4.6–6.5)

## 2019-05-09 ENCOUNTER — Other Ambulatory Visit: Payer: Self-pay

## 2019-05-09 ENCOUNTER — Ambulatory Visit
Admission: RE | Admit: 2019-05-09 | Discharge: 2019-05-09 | Disposition: A | Discharge status: Home / Self Care | Payer: Medicare Other | Source: Ambulatory Visit | Attending: Internal Medicine | Admitting: Internal Medicine

## 2019-05-09 ENCOUNTER — Encounter: Payer: Self-pay | Admitting: Internal Medicine

## 2019-05-09 DIAGNOSIS — Z853 Personal history of malignant neoplasm of breast: Secondary | ICD-10-CM

## 2019-05-09 DIAGNOSIS — Z1231 Encounter for screening mammogram for malignant neoplasm of breast: Secondary | ICD-10-CM | POA: Insufficient documentation

## 2019-05-10 ENCOUNTER — Other Ambulatory Visit: Payer: Self-pay | Admitting: Internal Medicine

## 2019-05-10 DIAGNOSIS — E1165 Type 2 diabetes mellitus with hyperglycemia: Secondary | ICD-10-CM

## 2019-05-10 MED ORDER — GLIPIZIDE ER 2.5 MG PO TB24: 2.5000 mg | ORAL_TABLET | Freq: Every day | ORAL | 0 refills | Status: DC

## 2019-05-21 ENCOUNTER — Other Ambulatory Visit: Payer: Self-pay | Admitting: Internal Medicine

## 2019-05-21 ENCOUNTER — Encounter: Payer: Self-pay | Admitting: Internal Medicine

## 2019-05-21 DIAGNOSIS — E1165 Type 2 diabetes mellitus with hyperglycemia: Secondary | ICD-10-CM

## 2019-05-21 MED ORDER — GLIPIZIDE ER 2.5 MG PO TB24: 2.5000 mg | ORAL_TABLET | Freq: Every day | ORAL | 3 refills | Status: DC

## 2019-05-23 ENCOUNTER — Telehealth: Payer: Self-pay

## 2019-05-23 NOTE — Telephone Encounter (Signed)
Forms faxed to [employer].

## 2019-06-15 ENCOUNTER — Ambulatory Visit (INDEPENDENT_AMBULATORY_CARE_PROVIDER_SITE_OTHER): Payer: Medicare Other

## 2019-06-15 ENCOUNTER — Other Ambulatory Visit: Payer: Self-pay

## 2019-06-15 DIAGNOSIS — Z23 Encounter for immunization: Secondary | ICD-10-CM | POA: Diagnosis not present

## 2019-07-31 ENCOUNTER — Other Ambulatory Visit: Payer: Self-pay

## 2019-07-31 ENCOUNTER — Ambulatory Visit (INDEPENDENT_AMBULATORY_CARE_PROVIDER_SITE_OTHER): Payer: Medicare Other | Admitting: Internal Medicine

## 2019-07-31 VITALS — Ht 67.0 in | Wt 166.0 lb

## 2019-07-31 DIAGNOSIS — E119 Type 2 diabetes mellitus without complications: Secondary | ICD-10-CM | POA: Diagnosis not present

## 2019-07-31 DIAGNOSIS — I10 Essential (primary) hypertension: Secondary | ICD-10-CM

## 2019-07-31 DIAGNOSIS — Z0100 Encounter for examination of eyes and vision without abnormal findings: Secondary | ICD-10-CM

## 2019-07-31 DIAGNOSIS — M1710 Unilateral primary osteoarthritis, unspecified knee: Secondary | ICD-10-CM | POA: Diagnosis not present

## 2019-07-31 DIAGNOSIS — E1165 Type 2 diabetes mellitus with hyperglycemia: Secondary | ICD-10-CM | POA: Diagnosis not present

## 2019-07-31 DIAGNOSIS — K760 Fatty (change of) liver, not elsewhere classified: Secondary | ICD-10-CM | POA: Diagnosis not present

## 2019-07-31 MED ORDER — ROSUVASTATIN CALCIUM 10 MG PO TABS: 10.0000 mg | ORAL_TABLET | Freq: Every day | ORAL | 3 refills | Status: DC

## 2019-07-31 MED ORDER — MELOXICAM 15 MG PO TABS: 15.0000 mg | ORAL_TABLET | Freq: Every day | ORAL | 3 refills | Status: DC | PRN

## 2019-07-31 MED ORDER — GLIPIZIDE ER 5 MG PO TB24: 5.0000 mg | ORAL_TABLET | Freq: Every day | ORAL | 3 refills | Status: DC

## 2019-07-31 MED ORDER — LOSARTAN POTASSIUM-HCTZ 100-25 MG PO TABS: 1.0000 | ORAL_TABLET | Freq: Every day | ORAL | 3 refills | Status: DC

## 2019-07-31 NOTE — Progress Notes (Signed)
Virtual Visit via Video Note  I connected with Christy Stout  on 07/31/19 at  2:50 PM EDT by a video enabled telemedicine application and verified that I am speaking with the correct person using two identifiers.  Location patient: home Location provider:work or home office Persons participating in the virtual visit: patient, provider  I discussed the limitations of evaluation and management by telemedicine and the availability of in person appointments. The patient expressed understanding and agreed to proceed.   HPI: 1. DM 2 cbg still 200s in the am at times 134  though eating right taking glipizide xl 2.5 mg qd, metformin 1K bid and januvia 100 mg qd which is not covered by insurance  -will CC pharm D to help  -due for eye exam  2. Just returned from [place] 2 weeks ago has 2 kids there and a house  3. Fatty liver rec hep A/B vaccine mailed Rx to get at pharmacy with shingrix   ROS: See pertinent positives and negatives per HPI.  Past Medical History:  Diagnosis Date  . Arthritis   . Cancer [facility]) 2000   right breast cancer 2000 lumpectomy 8 rounds chemo, 6 weeks radiation mammo 02/24/17 negative on Arimedex x 7 years   . Diabetes mellitus without complication ([place])   . History of chicken pox   . Hyperlipidemia   . Hypertension   . Personal history of chemotherapy 2000   Rt. Breast  . Personal history of radiation therapy 2000   Rt. breast  . UTI (urinary tract infection)     Past Surgical History:  Procedure Laterality Date  . BREAST EXCISIONAL BIOPSY Right 2000   +  . BREAST SURGERY     2000  . WRIST SURGERY      Family History  Problem Relation Age of Onset  . Cancer Mother        colon age 72  . Heart disease Father   . Stroke Father   . Arthritis Brother   . Diabetes Brother   . Cancer Maternal Grandmother        breast   . Breast cancer Maternal Grandmother   . Heart disease Maternal Grandfather   . Arthritis Brother   . Alcohol abuse Son   .  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  aspirin EC 81 MG tablet, Take 81 mg by mouth daily., Disp: , Rfl:  .  glipiZIDE (GLUCOTROL XL) 5 MG 24 hr tablet, Take 1 tablet (5 mg total) by mouth daily with breakfast., Disp: 90 tablet, Rfl: 3 .  glucose blood test strip, Use as instructed E11.9 One touch ultra blue test strip in am, Disp: 100 each, Rfl: 3 .  meloxicam (MOBIC) 15 MG tablet, Take 1 tablet (15 mg total) by mouth daily as needed for pain., Disp: 90 tablet, Rfl: 3 .  metFORMIN (GLUCOPHAGE) 1000 MG tablet, Take 1 tablet (1,000 mg total) by mouth 2 (two) times daily with a meal., Disp: 180 tablet, Rfl: 3 .  metFORMIN (GLUCOPHAGE) 1000 MG tablet, Take 1 tablet (1,000 mg total) by mouth 2 (two) times daily with a meal., Disp: 60 tablet, Rfl: 0 .  Multiple Vitamin (MULTI-VITAMIN DAILY  PO), Take by mouth., Disp: , Rfl:  .  Omega-3 Fatty Acids (FISH OIL PO), Take by mouth., Disp: , Rfl:  .  rosuvastatin (CRESTOR) 10 MG tablet, Take 1 tablet (10 mg total) by mouth at bedtime., Disp: 90 tablet, Rfl: 3 .  sitaGLIPtin (JANUVIA) 100 MG tablet, Take 1 tablet (100 mg total) by mouth daily., Disp: 90 tablet, Rfl: 3 .  Vitamin D, Cholecalciferol, 1000 units CAPS, Take 2,000 Int'l Units/day by mouth. , Disp: , Rfl:  .  zolpidem (AMBIEN) 5 MG tablet, Take 1 tablet (5 mg total) by mouth at bedtime as needed for sleep., Disp: 90 tablet, Rfl: 1 .  losartan-hydrochlorothiazide (HYZAAR) 100-25 MG tablet, Take 1 tablet by mouth daily. In am, Disp: 90 tablet, Rfl: 3  EXAM:  VITALS per patient if applicable:  GENERAL: alert, oriented, appears well and in no acute distress  HEENT: atraumatic, conjunttiva clear, no obvious abnormalities on  inspection of external nose and ears  NECK: normal movements of the head and neck  LUNGS: on inspection no signs of respiratory distress, breathing rate appears normal, no obvious gross SOB, gasping or wheezing  CV: no obvious cyanosis  MS: moves all visible extremities without noticeable abnormality  PSYCH/NEURO: pleasant and cooperative, no obvious depression or anxiety, speech and thought processing grossly intact  ASSESSMENT AND PLAN:  Discussed the following assessment and plan:  Arthritis of knee - Plan: meloxicam (MOBIC) 15 MG tablet qd prn   Type 2 diabetes mellitus with hyperglycemia, without long-term current use of insulin ([place]) - Plan: glipiZIDE (GLUCOTROL XL) 5 MG 24 hr tablet increase from 2.5 mg qd, rosuvastatin (CRESTOR) 10 MG tablet, Ambulatory referral to Ophthalmology AE Metformin 1000 mg bid  Hold januvia now 2/2 cost and cc pharm D for help  Foot exam in future    Essential hypertension - Plan: losartan-hydrochlorothiazide (HYZAAR) 100-25 MG tablet Per pt BP running 128/80   Fatty liver rec twinrix   HM Flu shot utd prevnar had 02/02/17  pna 23utd Rx shingrixin pastmailed Rx as well as twinrix 3/3/ doses mailed 08/01/19  Tdap 11/08/15  hep A/Bwith upcoming labs  -Hep C negative in 2019 x 1 per pt no proof try to get prior PCP Hep C negative 12/17/16   RequestedRecords labs, vaccines,pap 2015 and colonoscopy last PCP cant find report but reports due in 02/2020   PCPper pt had 05/2014 normal mom +colon cancer due 05/2019 DEXA 03/16/17 normal scanned in chart  Mammogram8/5/20 negative  Colonoscopy pt wants to do early in 2021   Out of age window pap no records from prior PCP Est. Dermatology Dr. Kellie Moor h/o NMSC sch 10/2019    03/28/19 [facility] [address] [facility] [place] mail order   Former pcp Clinical research associate NP [employer] [place] [phone] disc colonoscopy prev report   -we discussed possible serious and likely etiologies,  options for evaluation and workup, limitations of telemedicine visit vs in person visit, treatment, treatment risks and precautions. Pt prefers to treat via telemedicine empirically rather then risking or undertaking an in person visit at this moment. Patient agrees to seek prompt in person care if worsening, new symptoms arise, or if is not improving with treatment.   I discussed the assessment and treatment plan with the patient. The patient was provided an opportunity to ask questions and all were answered. The patient agreed with the plan and demonstrated an understanding of the instructions.   The patient was advised to call back or seek an  in-person evaluation if the symptoms worsen or if the condition fails to improve as anticipated.  Time spent 20 minutes  Delorise Jackson, MD

## 2019-08-01 ENCOUNTER — Encounter: Payer: Self-pay | Admitting: Internal Medicine

## 2019-08-01 NOTE — Patient Instructions (Signed)
Hepatitis A; Hepatitis B Vaccine injection What is this medicine? HEPATITIS A VACCINE; HEPATITIS B VACCINE (hep uh TAHY tis A vak SEEN; hep uh TAHY tis B vak SEEN) is a vaccine to protect from an infection with the hepatitis A and B virus. This vaccine does not contain the live viruses. It will not cause a hepatitis infection. This medicine may be used for other purposes; ask your health care provider or pharmacist if you have questions. COMMON BRAND NAME(S): Twinrix What should I tell my health care provider before I take this medicine? They need to know if you have any of these conditions:  bleeding disorder  fever or infection  heart disease  immune system problems  an unusual or allergic reaction to hepatitis A or B vaccine, neomycin, yeast, thimerosal, other medicines, foods, dyes, or preservatives  pregnant or trying to get pregnant  breast-feeding How should I use this medicine? This vaccine is for injection into a muscle. It is given by a health care professional. A copy of Vaccine Information Statements will be given before each vaccination. Read this sheet carefully each time. The sheet may change frequently. Talk to your pediatrician regarding the use of this medicine in children. Special care may be needed. Overdosage: If you think you have taken too much of this medicine contact a poison control center or emergency room at once. NOTE: This medicine is only for you. Do not share this medicine with others. What if I miss a dose? It is important not to miss your dose. Call your doctor or health care professional if you are unable to keep an appointment. What may interact with this medicine?  medicines that suppress your immune function like adalimumab, anakinra, infliximab  medicines to treat cancer  steroid medicines like prednisone or cortisone This list may not describe all possible interactions. Give your health care provider a list of all the medicines, herbs,  non-prescription drugs, or dietary supplements you use. Also tell them if you smoke, drink alcohol, or use illegal drugs. Some items may interact with your medicine. What should I watch for while using this medicine? See your health care provider for all shots of this vaccine as directed. You must have 3 to 4 shots of this vaccine for protection from hepatitis A and B infection. Tell your doctor right away if you have any serious or unusual side effects after getting this vaccine. What side effects may I notice from receiving this medicine? Side effects that you should report to your doctor or health care professional as soon as possible:  allergic reactions like skin rash, itching or hives, swelling of the face, lips, or tongue  breathing problems  confused, irritated  fast, irregular heartbeat  flu-like syndrome  numb, tingling pain  seizures Side effects that usually do not require medical attention (report to your doctor or health care professional if they continue or are bothersome):  diarrhea  fever  headache  loss of appetite  muscle pain  nausea  pain, redness, swelling, or irritation at site where injected  tiredness This list may not describe all possible side effects. Call your doctor for medical advice about side effects. You may report side effects to FDA at 1-800-FDA-1088. Where should I keep my medicine? This drug is given in a hospital or clinic and will not be stored at home. NOTE: This sheet is a summary. It may not cover all possible information. If you have questions about this medicine, talk to your doctor, pharmacist, or health   care provider.  2020 Elsevier/Gold Standard (2008-02-02 15:21:37)  Fatty Liver Disease  Fatty liver disease occurs when too much fat has built up in your liver cells. Fatty liver disease is also called hepatic steatosis or steatohepatitis. The liver removes harmful substances from your bloodstream and produces fluids that your  body needs. It also helps your body use and store energy from the food you eat. In many cases, fatty liver disease does not cause symptoms or problems. It is often diagnosed when tests are being done for other reasons. However, over time, fatty liver can cause inflammation that may lead to more serious liver problems, such as scarring of the liver (cirrhosis) and liver failure. Fatty liver is associated with insulin resistance, increased body fat, high blood pressure (hypertension), and high cholesterol. These are features of metabolic syndrome and increase your risk for stroke, diabetes, and heart disease. What are the causes? This condition may be caused by:  Drinking too much alcohol.  Poor nutrition.  Obesity.  Cushing's syndrome.  Diabetes.  High cholesterol.  Certain drugs.  Poisons.  Some viral infections.  Pregnancy. What increases the risk? You are more likely to develop this condition if you:  Abuse alcohol.  Are overweight.  Have diabetes.  Have hepatitis.  Have a high triglyceride level.  Are pregnant. What are the signs or symptoms? Fatty liver disease often does not cause symptoms. If symptoms do develop, they can include:  Fatigue.  Weakness.  Weight loss.  Confusion.  Abdominal pain.  Nausea and vomiting.  Yellowing of your skin and the white parts of your eyes (jaundice).  Itchy skin. How is this diagnosed? This condition may be diagnosed by:  A physical exam and medical history.  Blood tests.  Imaging tests, such as an ultrasound, CT scan, or MRI.  A liver biopsy. A small sample of liver tissue is removed using a needle. The sample is then looked at under a microscope. How is this treated? Fatty liver disease is often caused by other health conditions. Treatment for fatty liver may involve medicines and lifestyle changes to manage conditions such as:  Alcoholism.  High cholesterol.  Diabetes.  Being overweight or obese.  Follow these instructions at home:   Do not drink alcohol. If you have trouble quitting, ask your health care provider how to safely quit with the help of medicine or a supervised program. This is important to keep your condition from getting worse.  Eat a healthy diet as told by your health care provider. Ask your health care provider about working with a diet and nutrition specialist (dietitian) to develop an eating plan.  Exercise regularly. This can help you lose weight and control your cholesterol and diabetes. Talk to your health care provider about an exercise plan and which activities are best for you.  Take over-the-counter and prescription medicines only as told by your health care provider.  Keep all follow-up visits as told by your health care provider. This is important. Contact a health care provider if: You have trouble controlling your:  Blood sugar. This is especially important if you have diabetes.  Cholesterol.  Drinking of alcohol. Get help right away if:  You have abdominal pain.  You have jaundice.  You have nausea and vomiting.  You vomit blood or material that looks like coffee grounds.  You have stools that are black, tar-like, or bloody. Summary  Fatty liver disease develops when too much fat builds up in the cells of your liver.  Fatty  liver disease often causes no symptoms or problems. However, over time, fatty liver can cause inflammation that may lead to more serious liver problems, such as scarring of the liver (cirrhosis).  You are more likely to develop this condition if you abuse alcohol, are pregnant, are overweight, have diabetes, have hepatitis, or have high triglyceride levels.  Contact your health care provider if you have trouble controlling your weight, blood sugar, cholesterol, or drinking of alcohol. This information is not intended to replace advice given to you by your health care provider. Make sure you discuss any questions you  have with your health care provider. Document Released: 11/05/2005 Document Revised: 09/02/2017 Document Reviewed: 06/29/2017 Elsevier Patient Education  2020 [employer].

## 2019-08-08 ENCOUNTER — Other Ambulatory Visit: Payer: Medicare Other

## 2019-08-09 ENCOUNTER — Other Ambulatory Visit (INDEPENDENT_AMBULATORY_CARE_PROVIDER_SITE_OTHER): Payer: Medicare Other

## 2019-08-09 ENCOUNTER — Other Ambulatory Visit: Payer: Self-pay

## 2019-08-09 DIAGNOSIS — I10 Essential (primary) hypertension: Secondary | ICD-10-CM | POA: Diagnosis not present

## 2019-08-09 DIAGNOSIS — K760 Fatty (change of) liver, not elsewhere classified: Secondary | ICD-10-CM | POA: Diagnosis not present

## 2019-08-09 DIAGNOSIS — E1165 Type 2 diabetes mellitus with hyperglycemia: Secondary | ICD-10-CM

## 2019-08-09 LAB — COMPREHENSIVE METABOLIC PANEL
ALT: 60 U/L — ABNORMAL HIGH (ref 0–35)
AST: 39 U/L — ABNORMAL HIGH (ref 0–37)
Albumin: 4.9 g/dL (ref 3.5–5.2)
Alkaline Phosphatase: 40 U/L (ref 39–117)
BUN: 22 mg/dL (ref 6–23)
CO2: 28 mEq/L (ref 19–32)
Calcium: 10 mg/dL (ref 8.4–10.5)
Chloride: 98 mEq/L (ref 96–112)
Creatinine, Ser: 0.64 mg/dL (ref 0.40–1.20)
GFR: 92.13 mL/min (ref >60.00)
Glucose, Bld: 157 mg/dL — ABNORMAL HIGH (ref 70–99)
Potassium: 4.6 mEq/L (ref 3.5–5.1)
Sodium: 136 mEq/L (ref 135–145)
Total Bilirubin: 0.5 mg/dL (ref 0.2–1.2)
Total Protein: 7.2 g/dL (ref 6.0–8.3)

## 2019-08-09 LAB — URINALYSIS, ROUTINE W REFLEX MICROSCOPIC
Bilirubin Urine: NEGATIVE
Hgb urine dipstick: NEGATIVE
Ketones, ur: NEGATIVE
Leukocytes,Ua: NEGATIVE
Nitrite: NEGATIVE
Specific Gravity, Urine: 1.02 (ref 1.000–1.030)
Total Protein, Urine: NEGATIVE
Urine Glucose: NEGATIVE
Urobilinogen, UA: 0.2 (ref 0.0–1.0)
pH: 5.5 (ref 5.0–8.0)

## 2019-08-09 LAB — LIPID PANEL
Cholesterol: 136 mg/dL (ref 0–200)
HDL: 39.4 mg/dL (ref >39.00)
LDL Cholesterol: 62 mg/dL (ref 0–99)
NonHDL: 96.64
Total CHOL/HDL Ratio: 3
Triglycerides: 174 mg/dL — ABNORMAL HIGH (ref 0.0–149.0)
VLDL: 34.8 mg/dL (ref 0.0–40.0)

## 2019-08-09 LAB — MICROALBUMIN / CREATININE URINE RATIO
Creatinine,U: 85.3 mg/dL
Microalb Creat Ratio: 0.9 mg/g (ref 0.0–30.0)
Microalb, Ur: 0.8 mg/dL (ref 0.0–1.9)

## 2019-08-09 LAB — HEMOGLOBIN A1C: Hgb A1c MFr Bld: 7.3 % — ABNORMAL HIGH (ref 4.6–6.5)

## 2019-08-10 ENCOUNTER — Encounter: Payer: Self-pay | Admitting: Internal Medicine

## 2019-08-13 ENCOUNTER — Other Ambulatory Visit: Payer: Self-pay | Admitting: Internal Medicine

## 2019-08-13 DIAGNOSIS — K8020 Calculus of gallbladder without cholecystitis without obstruction: Secondary | ICD-10-CM

## 2019-08-13 DIAGNOSIS — Z853 Personal history of malignant neoplasm of breast: Secondary | ICD-10-CM

## 2019-08-13 DIAGNOSIS — R748 Abnormal levels of other serum enzymes: Secondary | ICD-10-CM

## 2019-08-13 DIAGNOSIS — K760 Fatty (change of) liver, not elsewhere classified: Secondary | ICD-10-CM

## 2019-08-14 DIAGNOSIS — H2513 Age-related nuclear cataract, bilateral: Secondary | ICD-10-CM | POA: Diagnosis not present

## 2019-08-14 LAB — HM DIABETES EYE EXAM

## 2019-08-16 ENCOUNTER — Encounter: Payer: Self-pay | Admitting: Internal Medicine

## 2019-08-20 ENCOUNTER — Ambulatory Visit: Payer: Medicare Other

## 2019-08-23 ENCOUNTER — Ambulatory Visit: Payer: Self-pay | Admitting: Pharmacist

## 2019-08-23 ENCOUNTER — Telehealth: Payer: Medicare Other

## 2019-08-23 NOTE — Chronic Care Management (AMB) (Signed)
  Chronic Care Management   Note  08/23/2019 Name: Christy Stout MRN: [id number] DOB: [birth year]  Christy Stout is a 68 y.o. year old female who is a primary care patient of McLean-Scocuzza, Nino Glow, MD. The CCM team was consulted for assistance with chronic disease management and care coordination needs.    Attempted to contact patient to discuss CCM referral. Left HIPAA compliant message for patient to return my call at her convenience.   Follow up plan: - If I do not hear back, will attempt outreach again in the next 1-2 weeks  Catie Darnelle Maffucci, PharmD, [place] Pharmacist [place] [phone]

## 2019-09-03 ENCOUNTER — Ambulatory Visit
Admission: RE | Admit: 2019-09-03 | Discharge: 2019-09-03 | Disposition: A | Discharge status: Home / Self Care | Payer: Medicare Other | Source: Ambulatory Visit | Attending: Internal Medicine | Admitting: Internal Medicine

## 2019-09-03 ENCOUNTER — Other Ambulatory Visit: Payer: Self-pay

## 2019-09-03 ENCOUNTER — Ambulatory Visit (INDEPENDENT_AMBULATORY_CARE_PROVIDER_SITE_OTHER): Payer: Medicare Other | Admitting: Pharmacist

## 2019-09-03 DIAGNOSIS — K8020 Calculus of gallbladder without cholecystitis without obstruction: Secondary | ICD-10-CM

## 2019-09-03 DIAGNOSIS — R748 Abnormal levels of other serum enzymes: Secondary | ICD-10-CM | POA: Insufficient documentation

## 2019-09-03 DIAGNOSIS — Z853 Personal history of malignant neoplasm of breast: Secondary | ICD-10-CM | POA: Diagnosis not present

## 2019-09-03 DIAGNOSIS — E1165 Type 2 diabetes mellitus with hyperglycemia: Secondary | ICD-10-CM | POA: Diagnosis not present

## 2019-09-03 DIAGNOSIS — K760 Fatty (change of) liver, not elsewhere classified: Secondary | ICD-10-CM | POA: Diagnosis not present

## 2019-09-03 MED ORDER — IOHEXOL 300 MG/ML  SOLN
100.0000 mL | Freq: Once | INTRAMUSCULAR | Status: AC | PRN
  Administered 2019-09-03: 100 mL via INTRAVENOUS

## 2019-09-03 NOTE — Patient Instructions (Signed)
Visit Information  Goals Addressed            This Visit's Progress     Patient Stated   . PharmD "I can't afford my Januvia" (pt-stated)       Current Barriers:  . Diabetes: uncontrolled but improved; most recent A1c 7.3%  . Current antihyperglycemic regimen: metformin 1000 mg BID, glipizide XL 5 mg QAM; prescribed Januvia 100 mg daily but has been unable to afford since she switched to her new [employer] prescription drug plan (notes ~$300 copay) . Denies hypoglycemic symptoms, including dizziness, lightheadedness, shaking, sweating . Denies hyperglycemic symptoms, including polyuria, polydipsia, polyphagia, nocturia, blurred vision, neuropathy . Current blood glucose readings: Reports fastings 150-170 . Cardiovascular risk reduction: o Current hypertensive regimen: losartan/HCTZ 100/25 mg daily o Current hyperlipidemia regimen: rosuvastatin 10 mg daily  Pharmacist Clinical Goal(s):  Marland Kitchen Over the next 90 days, patient will work with PharmD and primary care provider to address optimized medication management  Interventions: . Comprehensive medication review performed, medication list updated in electronic medical record . Discussed patient assistance program for DPP4 agents, such as Januvia or Tradjenta. Unfortunately, she is over income in 2020 d/t her husband having received retirement bonuses. She is unsure if she is going to qualify in 2021; will review planned social security income and other sources with her husband to determine if she will qualify.  . Will outreach [employer] drug representative to determine if we could obtain Januvia samples in the meantime  Patient Self Care Activities:  . Patient will check blood glucose BID , document, and provide at future appointments . Patient will take medications as prescribed . Patient will report any questions or concerns to provider   Initial goal documentation        Ms. Yaden was given information about Chronic Care Management  services today including:  1. CCM service includes personalized support from designated clinical staff supervised by her physician, including individualized plan of care and coordination with other care providers 2. 24/7 contact phone numbers for assistance for urgent and routine care needs. 3. Service will only be billed when office clinical staff spend 20 minutes or more in a month to coordinate care. 4. Only one practitioner may furnish and bill the service in a calendar month. 5. The patient may stop CCM services at any time (effective at the end of the month) by phone call to the office staff. 6. The patient will be responsible for cost sharing (co-pay) of up to 20% of the service fee (after annual deductible is met).  Patient agreed to services and verbal consent obtained.   The patient verbalized understanding of instructions provided today and declined a print copy of patient instruction materials.   Plan: - Scheduled outreach to patient in ~6 weeks for continued medication management support  Catie Darnelle Maffucci, PharmD, [place], CPP Clinical Pharmacist [place] [phone]

## 2019-09-03 NOTE — Chronic Care Management (AMB) (Signed)
Chronic Care Management   Note  09/03/2019 Name: Christy Stout MRN: [id number] DOB: [birth year]   Subjective:  Christy Stout is a 68 y.o. year old female who is a primary care patient of McLean-Scocuzza, Nino Glow, MD. The CCM team was consulted for assistance with chronic disease management and care coordination needs.    Contacted patient for medication management review today.   Christy Stout was given information about Chronic Care Management services today including:  1. CCM service includes personalized support from designated clinical staff supervised by her physician, including individualized plan of care and coordination with other care providers 2. 24/7 contact phone numbers for assistance for urgent and routine care needs. 3. Service will only be billed when office clinical staff spend 20 minutes or more in a month to coordinate care. 4. Only one practitioner may furnish and bill the service in a calendar month. 5. The patient may stop CCM services at any time (effective at the end of the month) by phone call to the office staff. 6. The patient will be responsible for cost sharing (co-pay) of up to 20% of the service fee (after annual deductible is met).  Patient agreed to services and verbal consent obtained.   Review of patient status, including review of consultants reports, laboratory and other test data, was performed as part of comprehensive evaluation and provision of chronic care management services.   Objective:  Lab Results  Component Value Date   CREATININE 0.64 08/09/2019   CREATININE 0.63 05/02/2019   CREATININE 0.66 10/19/2018    Lab Results  Component Value Date   HGBA1C 7.3 (H) 08/09/2019       Component Value Date/Time   CHOL 136 08/09/2019 0850   TRIG 174.0 (H) 08/09/2019 0850   HDL 39.40 08/09/2019 0850   CHOLHDL 3 08/09/2019 0850   VLDL 34.8 08/09/2019 0850   LDLCALC 62 08/09/2019 0850   LDLCALC 55 10/19/2018 0859    Clinical  ASCVD: No  The 10-year ASCVD risk score Mikey Bussing DC Jr., et al., 2013) is: 23.2%   Values used to calculate the score:     Age: 73 years     Sex: Female     Is Non-Hispanic African American: No     Diabetic: Yes     Tobacco smoker: No     Systolic Blood Pressure: 458 mmHg     Is BP treated: Yes     HDL Cholesterol: 39.4 mg/dL     Total Cholesterol: 136 mg/dL    BP Readings from Last 3 Encounters:  10/11/18 (!) 148/86  06/13/18 (!) 152/88  03/16/18 (!) 152/86    No Known Allergies  Medications Reviewed Today    Reviewed by De Hollingshead, [place] (Pharmacist) on 09/03/19 at 1641  Med List Status: <None>  Medication Order Taking? Sig Documenting Provider Last Dose Status Informant  aspirin EC 81 MG tablet [id number] Yes Take 81 mg by mouth daily. [provider] Taking Active   glipiZIDE (GLUCOTROL XL) 5 MG 24 hr tablet [id number] Yes Take 1 tablet (5 mg total) by mouth daily with breakfast. McLean-Scocuzza, Nino Glow, MD Taking Active   glucose blood test strip [id number]  Use as instructed E11.9 One touch ultra blue test strip in am McLean-Scocuzza, Nino Glow, MD  Active   losartan-hydrochlorothiazide (HYZAAR) 100-25 MG tablet [id number] Yes Take 1 tablet by mouth daily. In am McLean-Scocuzza, Nino Glow, MD Taking Active   meloxicam (MOBIC) 15 MG tablet [id number] Yes Take 1 tablet (15 mg total)  by mouth daily as needed for pain. McLean-Scocuzza, Nino Glow, MD Taking Active            Med Note [facility] Ao Sep 03, 2019  4:41 PM) Using 2-3 times weekly  metFORMIN (GLUCOPHAGE) 1000 MG tablet [id number] Yes Take 1 tablet (1,000 mg total) by mouth 2 (two) times daily with a meal. McLean-Scocuzza, Nino Glow, MD Taking Active   Multiple Vitamin (MULTI-VITAMIN DAILY PO) [id number] Yes Take by mouth. [provider] Taking Active   Omega-3 Fatty Acids (FISH OIL PO) [id number] Yes Take by mouth. [provider] Taking Active   rosuvastatin (CRESTOR) 10 MG tablet  [id number] Yes Take 1 tablet (10 mg total) by mouth at bedtime. McLean-Scocuzza, Nino Glow, MD Taking Active   sitaGLIPtin (JANUVIA) 100 MG tablet [id number] No Take 1 tablet (100 mg total) by mouth daily.  Patient not taking: Reported on 09/03/2019   McLean-Scocuzza, Nino Glow, MD Not Taking Active   Vitamin D, Cholecalciferol, 1000 units CAPS [id number] Yes Take 2,000 Int'l Units/day by mouth.  [provider] Taking Active   zolpidem (AMBIEN) 5 MG tablet [id number] Yes Take 1 tablet (5 mg total) by mouth at bedtime as needed for sleep. McLean-Scocuzza, Nino Glow, MD Taking Active            Assessment:   Goals Addressed            This Visit's Progress     Patient Stated   . PharmD "I can't afford my Januvia" (pt-stated)       Current Barriers:  . Diabetes: uncontrolled but improved; most recent A1c 7.3%  . Current antihyperglycemic regimen: metformin 1000 mg BID, glipizide XL 5 mg QAM; prescribed Januvia 100 mg daily but has been unable to afford since she switched to her new [employer] prescription drug plan (notes ~$300 copay) . Denies hypoglycemic symptoms, including dizziness, lightheadedness, shaking, sweating . Denies hyperglycemic symptoms, including polyuria, polydipsia, polyphagia, nocturia, blurred vision, neuropathy . Current blood glucose readings: Reports fastings 150-170 . Cardiovascular risk reduction: o Current hypertensive regimen: losartan/HCTZ 100/25 mg daily o Current hyperlipidemia regimen: rosuvastatin 10 mg daily  Pharmacist Clinical Goal(s):  Marland Kitchen Over the next 90 days, patient will work with PharmD and primary care provider to address optimized medication management  Interventions: . Comprehensive medication review performed, medication list updated in electronic medical record . Discussed patient assistance program for DPP4 agents, such as Januvia or Tradjenta. Unfortunately, she is over income in 2020 d/t her husband having received retirement bonuses. She  is unsure if she is going to qualify in 2021; will review planned social security income and other sources with her husband to determine if she will qualify.  . Will outreach [employer] drug representative to determine if we could obtain Januvia samples in the meantime  Patient Self Care Activities:  . Patient will check blood glucose BID , document, and provide at future appointments . Patient will take medications as prescribed . Patient will report any questions or concerns to provider   Initial goal documentation        Plan: - Scheduled outreach to patient in ~6 weeks for continued medication management support  Catie Darnelle Maffucci, PharmD, [place], CPP Clinical Pharmacist [place] [phone]

## 2019-09-07 ENCOUNTER — Other Ambulatory Visit: Payer: Self-pay | Admitting: Internal Medicine

## 2019-09-07 DIAGNOSIS — E119 Type 2 diabetes mellitus without complications: Secondary | ICD-10-CM

## 2019-09-07 MED ORDER — SITAGLIPTIN PHOSPHATE 100 MG PO TABS: 100.0000 mg | ORAL_TABLET | Freq: Every day | ORAL | 3 refills | Status: DC

## 2019-09-13 IMAGING — MG DIGITAL SCREENING BILATERAL MAMMOGRAM WITH TOMO AND CAD
8 series · 9 of 24 positions shown · non-contrast
Comparison: Previous exam(s).

CLINICAL DATA: Screening.

EXAM:
DIGITAL SCREENING BILATERAL MAMMOGRAM WITH TOMO AND CAD

[R MLO synth-2D]
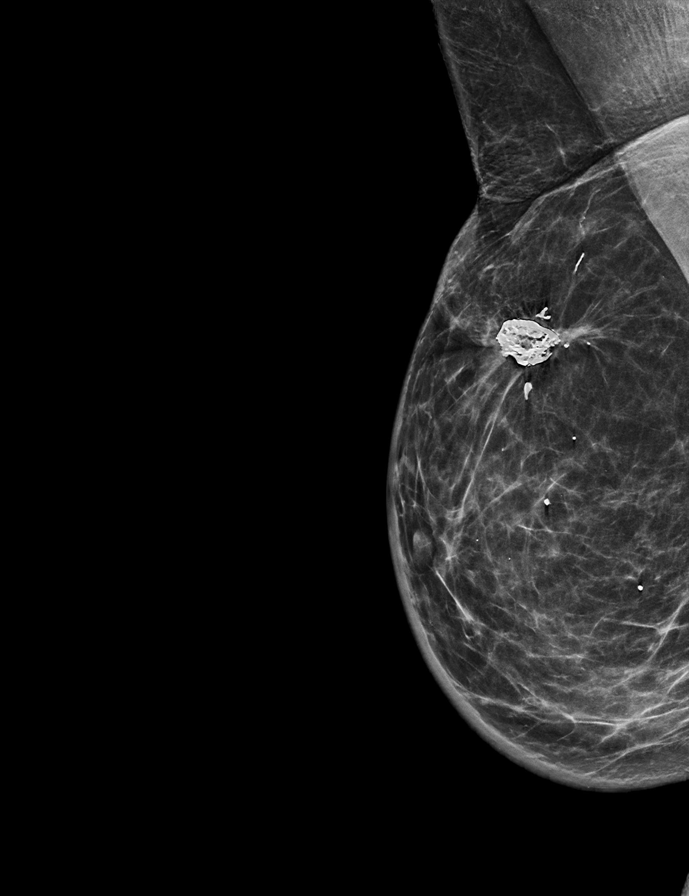

[L MLO synth-2D]
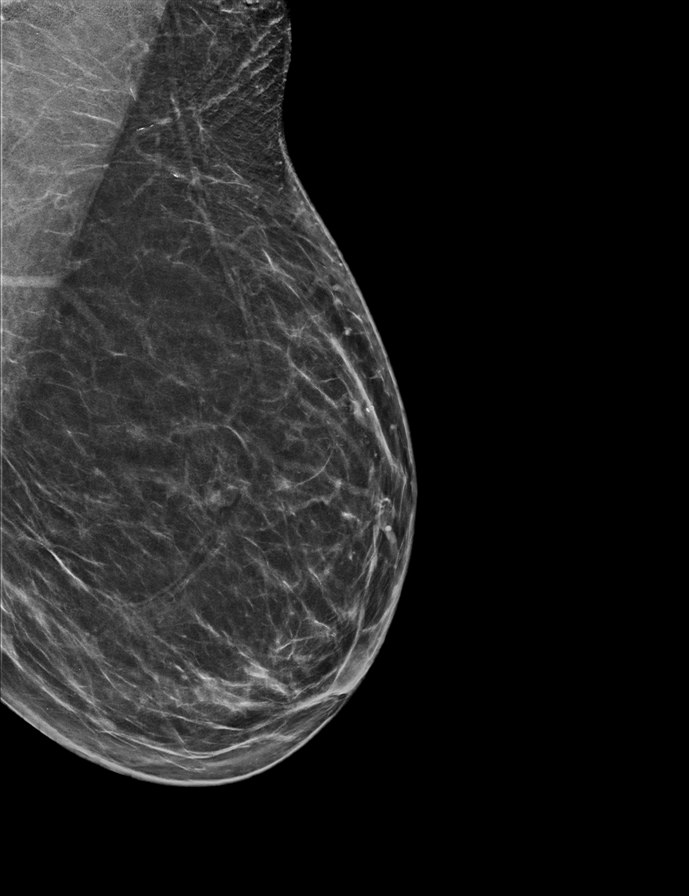

[L CC synth-2D]
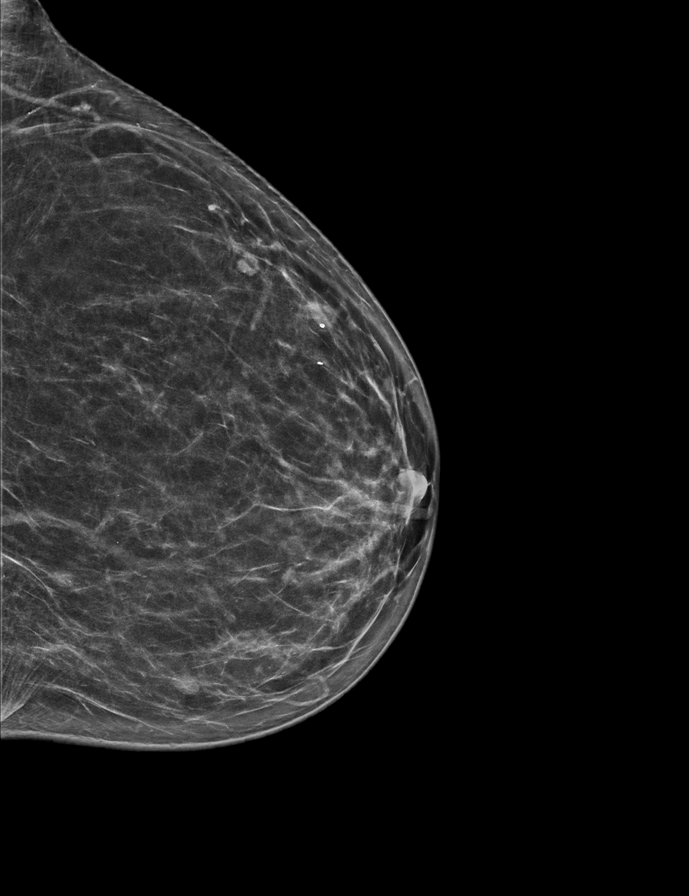

[R CC synth-2D]
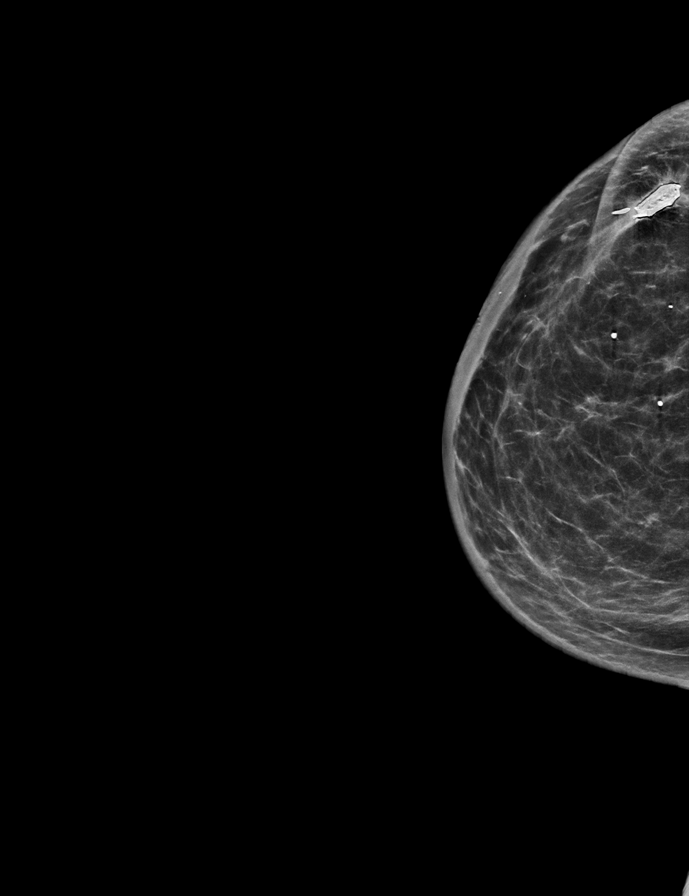

[L MLO tomo · 2 of 69 frames shown]
[frame 23/69]
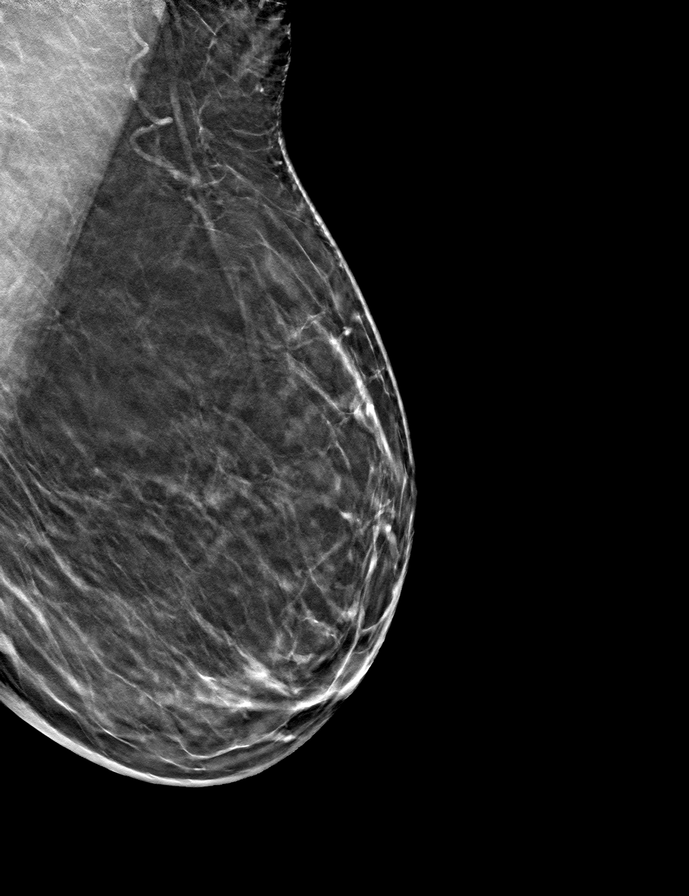
[frame 35/69]
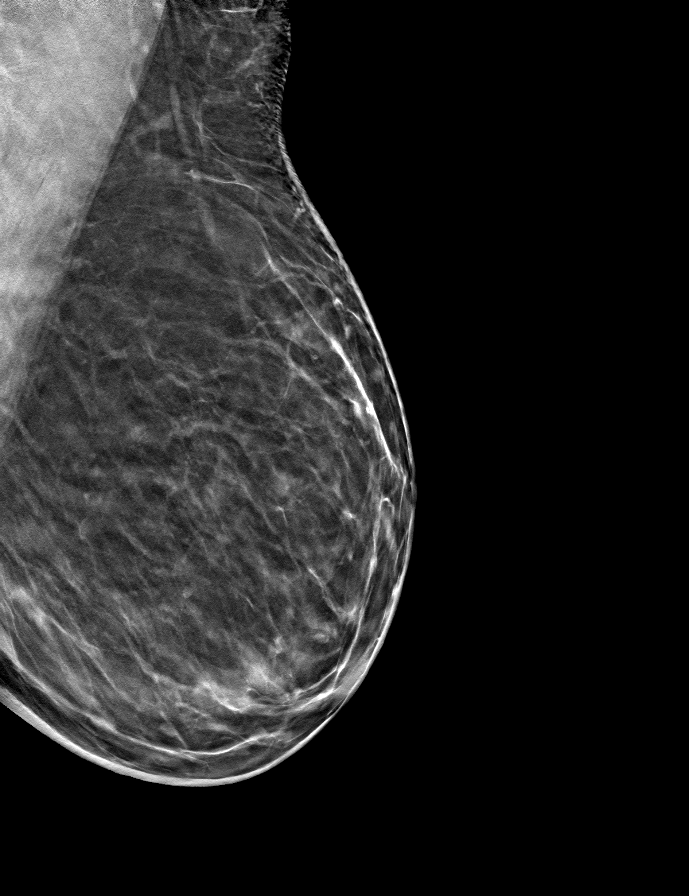

[R CC tomo · tomo slice 33/66.0]
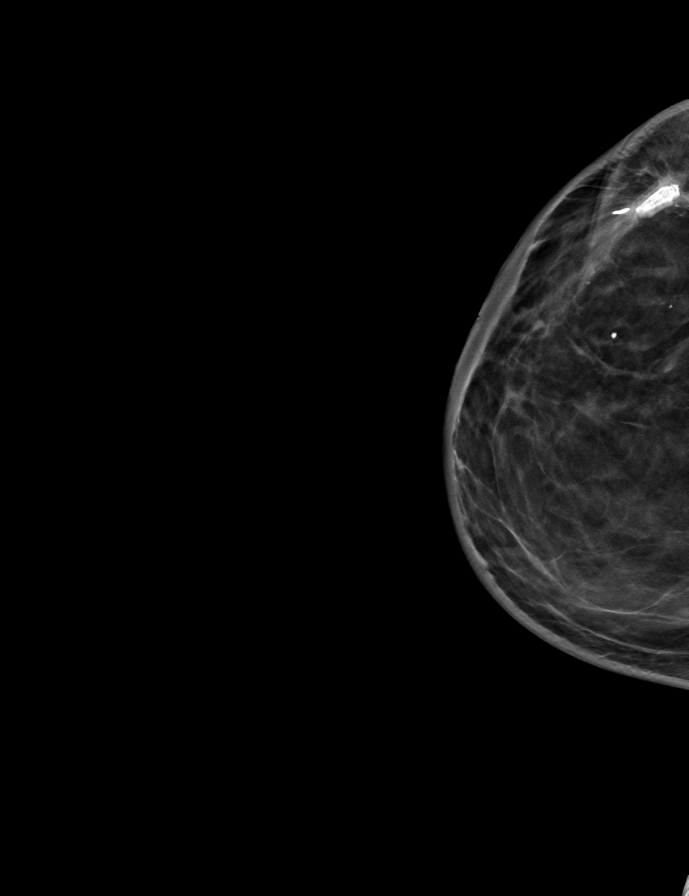

[R MLO tomo · tomo slice 34/67.0]
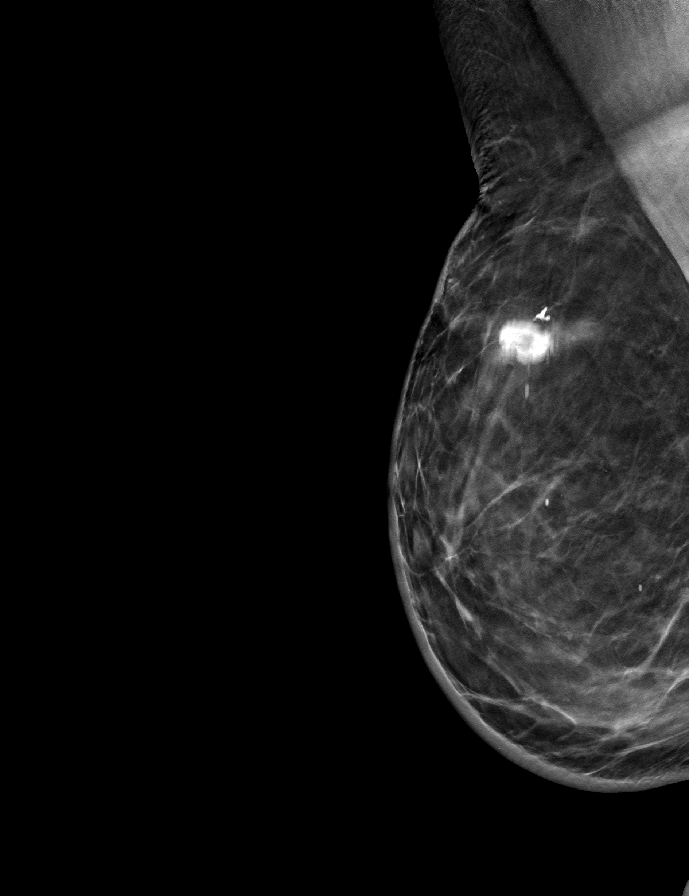

[L CC tomo · tomo slice 32/63.0]
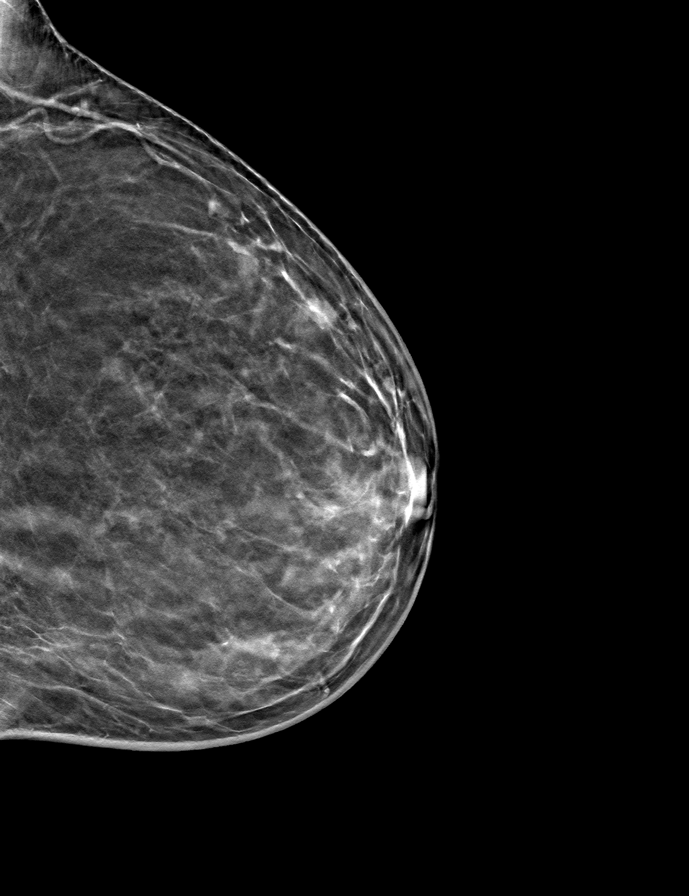

[9 of 24 positions shown; findings below may reference images not displayed]

ACR Breast Density Category b: There are scattered areas of
fibroglandular density.
FINDINGS: There are no findings suspicious for malignancy. Images were
processed with CAD.
IMPRESSION: No mammographic evidence of malignancy. A result letter of this
screening mammogram will be mailed directly to the patient.

RECOMMENDATION:
Screening mammogram in one year. (Code:[id number])

BI-RADS CATEGORY  1: Negative.

## 2019-09-17 ENCOUNTER — Encounter: Payer: Self-pay | Admitting: Internal Medicine

## 2019-09-17 ENCOUNTER — Other Ambulatory Visit: Payer: Self-pay | Admitting: Internal Medicine

## 2019-09-17 DIAGNOSIS — I2510 Atherosclerotic heart disease of native coronary artery without angina pectoris: Secondary | ICD-10-CM | POA: Insufficient documentation

## 2019-09-17 DIAGNOSIS — K5790 Diverticulosis of intestine, part unspecified, without perforation or abscess without bleeding: Secondary | ICD-10-CM | POA: Insufficient documentation

## 2019-09-17 DIAGNOSIS — R935 Abnormal findings on diagnostic imaging of other abdominal regions, including retroperitoneum: Secondary | ICD-10-CM

## 2019-09-17 DIAGNOSIS — R937 Abnormal findings on diagnostic imaging of other parts of musculoskeletal system: Secondary | ICD-10-CM

## 2019-09-17 DIAGNOSIS — K224 Dyskinesia of esophagus: Secondary | ICD-10-CM | POA: Insufficient documentation

## 2019-09-17 DIAGNOSIS — K219 Gastro-esophageal reflux disease without esophagitis: Secondary | ICD-10-CM | POA: Insufficient documentation

## 2019-09-17 DIAGNOSIS — Q7649 Other congenital malformations of spine, not associated with scoliosis: Secondary | ICD-10-CM

## 2019-09-21 ENCOUNTER — Encounter: Payer: Self-pay | Admitting: Internal Medicine

## 2019-10-02 ENCOUNTER — Ambulatory Visit: Payer: Medicare Other

## 2019-10-23 ENCOUNTER — Ambulatory Visit: Payer: Medicare Other

## 2019-10-26 ENCOUNTER — Other Ambulatory Visit: Payer: Self-pay

## 2019-10-30 ENCOUNTER — Ambulatory Visit: Payer: Medicare Other

## 2019-11-01 ENCOUNTER — Other Ambulatory Visit: Payer: Self-pay

## 2019-11-01 ENCOUNTER — Ambulatory Visit (INDEPENDENT_AMBULATORY_CARE_PROVIDER_SITE_OTHER): Payer: Medicare Other | Admitting: Internal Medicine

## 2019-11-01 VITALS — BP 132/79 | Ht 67.0 in | Wt 164.0 lb

## 2019-11-01 DIAGNOSIS — K760 Fatty (change of) liver, not elsewhere classified: Secondary | ICD-10-CM

## 2019-11-01 DIAGNOSIS — C50911 Malignant neoplasm of unspecified site of right female breast: Secondary | ICD-10-CM

## 2019-11-01 DIAGNOSIS — R948 Abnormal results of function studies of other organs and systems: Secondary | ICD-10-CM

## 2019-11-01 DIAGNOSIS — R937 Abnormal findings on diagnostic imaging of other parts of musculoskeletal system: Secondary | ICD-10-CM

## 2019-11-01 DIAGNOSIS — Z1159 Encounter for screening for other viral diseases: Secondary | ICD-10-CM | POA: Diagnosis not present

## 2019-11-01 DIAGNOSIS — Z1329 Encounter for screening for other suspected endocrine disorder: Secondary | ICD-10-CM

## 2019-11-01 DIAGNOSIS — E1165 Type 2 diabetes mellitus with hyperglycemia: Secondary | ICD-10-CM

## 2019-11-01 DIAGNOSIS — Z0184 Encounter for antibody response examination: Secondary | ICD-10-CM | POA: Diagnosis not present

## 2019-11-01 DIAGNOSIS — I10 Essential (primary) hypertension: Secondary | ICD-10-CM

## 2019-11-01 MED ORDER — BLOOD GLUCOSE METER KIT: PACK | 0 refills | Status: DC

## 2019-11-01 MED ORDER — GLIPIZIDE ER 10 MG PO TB24: 10.0000 mg | ORAL_TABLET | Freq: Every day | ORAL | 3 refills | Status: DC

## 2019-11-01 NOTE — Progress Notes (Signed)
Virtual Visit via Video Note  I connected with Christy Stout   on 11/01/19 at  2:50 PM EST by a video enabled telemedicine application and verified that I am speaking with the correct person using two identifiers.  Location patient: home Location provider:work or home office Persons participating in the virtual visit: patient, provider  I discussed the limitations of evaluation and management by telemedicine and the availability of in person appointments. The patient expressed understanding and agreed to proceed.   HPI: 1. DM 2 cbg still elevated some ams 200s at times 125, 140s, 150s and trying to eat healthy. She is checking BID sugar on metformin 1K bid, was on januvia 100 mg qd but costly so stopped and started on glipizide 5 mg xl which is helping but not completely she reports she has some metformin Xr 500 mg at homes and will resume those 1000 XR bid.   She will see if Tradjenta or onglyza is covered with her new insurance plan She requests new meter hers is 69+ years old will sent to [facility] today  TBD eye surgery   2. HTN BP 132/79 today on hyzaar 100/25 qd   3. T12 bone sclerosis noted and MRI scheduled to 12/2019 to f/u   ROS: See pertinent positives and negatives per HPI.  Past Medical History:  Diagnosis Date  . Arthritis   . Breast cancer, right ([place]) 2000    lumpectomy 8 rounds chemo, 6 weeks radiation mammo 02/24/17 negative on Arimedex x 7 years   . Diabetes mellitus without complication ([place])   . History of chicken pox   . Hyperlipidemia   . Hypertension   . Personal history of chemotherapy 2000   Rt. Breast  . Personal history of radiation therapy 2000   Rt. breast  . UTI (urinary tract infection)     Past Surgical History:  Procedure Laterality Date  . BREAST EXCISIONAL BIOPSY Right 2000   +  . BREAST SURGERY     2000  . WRIST SURGERY      Family History  Problem Relation Age of Onset  . Cancer Mother        colon age 53  . Heart disease Father    . Stroke Father   . Arthritis Brother   . Diabetes Brother   . Cancer Maternal Grandmother        breast   . Breast cancer Maternal Grandmother   . Heart disease Maternal Grandfather   . Arthritis Brother   . Alcohol abuse Son   . Hypertension Son     SOCIAL HX: married with kids  Lives with husband    Current Outpatient Medications:  .  aspirin EC 81 MG tablet, Take 81 mg by mouth daily., Disp: , Rfl:  .  glipiZIDE (GLUCOTROL XL) 10 MG 24 hr tablet, Take 1 tablet (10 mg total) by mouth daily with breakfast., Disp: 90 tablet, Rfl: 3 .  glucose blood test strip, Use as instructed E11.9 One touch ultra blue test strip in am, Disp: 100 each, Rfl: 3 .  losartan-hydrochlorothiazide (HYZAAR) 100-25 MG tablet, Take 1 tablet by mouth daily. In am, Disp: 90 tablet, Rfl: 3 .  meloxicam (MOBIC) 15 MG tablet, Take 1 tablet (15 mg total) by mouth daily as needed for pain., Disp: 90 tablet, Rfl: 3 .  Multiple Vitamin (MULTI-VITAMIN DAILY PO), Take by mouth., Disp: , Rfl:  .  Omega-3 Fatty Acids (FISH OIL PO), Take by mouth., Disp: , Rfl:  .  rosuvastatin (CRESTOR) 10 MG tablet, Take 1 tablet (10 mg total) by mouth at bedtime., Disp: 90 tablet, Rfl: 3 .  Vitamin D, Cholecalciferol, 1000 units CAPS, Take 2,000 Int'l Units/day by mouth. , Disp: , Rfl:  .  zolpidem (AMBIEN) 5 MG tablet, Take 1 tablet (5 mg total) by mouth at bedtime as needed for sleep., Disp: 90 tablet, Rfl: 1 .  blood glucose meter kit and supplies, Dispense based on patient and insurance preference. Bid  directed. (FOR ICD-10 E10.9, E11.9). 1 year supply strips and lancets, Disp: 1 each, Rfl: 0  EXAM:  VITALS per patient if applicable:  GENERAL: alert, oriented, appears well and in no acute distress  HEENT: atraumatic, conjunttiva clear, no obvious abnormalities on inspection of external nose and ears  NECK: normal movements of the head and neck  LUNGS: on inspection no signs of respiratory distress, breathing rate appears  normal, no obvious gross SOB, gasping or wheezing  CV: no obvious cyanosis  MS: moves all visible extremities without noticeable abnormality  PSYCH/NEURO: pleasant and cooperative, no obvious depression or anxiety, speech and thought processing grossly intact  ASSESSMENT AND PLAN:  Discussed the following assessment and plan:  Essential hypertension Controlled  Cont meds  Check fasting labs upcoming   Type 2 diabetes mellitus with hyperglycemia, without long-term current use of insulin (HCC) - Plan: glipiZIDE (GLUCOTROL XL) 10 MG 24 hr tablet increase from 5 mg qd xl, blood glucose meter kit and supplies new sent to [facility]  hyperglycemia in the am add back metformin XR 500 mg will take 1000 XR bid  Eye surgery TBD f/u eye md eye exam had 08/14/19 neg retinopathy  Foot exam in future   Fatty liver Check hep A titer if negative consider twinrix if + consider new hep B vaccine x 2 doses   Abnormal T12 bone sclerosis  Pending MRI T back 12/2019 to f/u   HM Flu shot utd prevnar had 02/02/17 pna 23utd MMR immune  Rx shingrixin pastmailed Rx as well as twinrix 3/3/ doses mailed 08/01/19  unclear if had as of 11/01/19 Tdap2/4/17 covid cx 11/03/19 scheduled   hepAwith upcominglabs  Hep C negative 12/17/16  RequestedRecords labs, vaccines,pap 2015 and colonoscopy lastPCP cant find report but reports due in 02/2020  PCPper pt had 05/2014 normal mom +colon cancer due 05/2019 DEXA 03/16/17 normal scanned in chart  Mammogram8/5/20 negative  Colonoscopy pt wants to do early in 2021 disc 11/01/19 and wants to still wait no alarm sx's but she does have FH mom + colon cancer  Out of age window pap no records from prior PCP Est. Dermatology Dr. Kellie Moor h/o [facility] resch 12/2019   6/24/20Pharmacy [facility] [address] and [place] mail order   Eye surgery tbd ? scheduled 11/08/19 pt moved back later date   Former pcp [facility] [employer] NP [employer] [facility] [phone] disc  colonoscopyprev report   -we discussed possible serious and likely etiologies, options for evaluation and workup, limitations of telemedicine visit vs in person visit, treatment, treatment risks and precautions. Pt prefers to treat via telemedicine empirically rather then risking or undertaking an in person visit at this moment. Patient agrees to seek prompt in person care if worsening, new symptoms arise, or if is not improving with treatment.   I discussed the assessment and treatment plan with the patient. The patient was provided an opportunity to ask questions and all were answered. The patient agreed with the plan and demonstrated an understanding of the instructions.  The patient was advised to call back or seek an in-person evaluation if the symptoms worsen or if the condition fails to improve as anticipated.  Time spent 20-29 minutes  Delorise Jackson, MD

## 2019-11-05 ENCOUNTER — Encounter: Payer: Self-pay | Admitting: Internal Medicine

## 2019-11-05 NOTE — Patient Instructions (Signed)
Hyperglycemia Hyperglycemia occurs when the level of sugar (glucose) in the blood is too high. Glucose is a type of sugar that provides the body's main source of energy. Certain hormones (insulin and glucagon) control the level of glucose in the blood. Insulin lowers blood glucose, and glucagon increases blood glucose. Hyperglycemia can result from having too little insulin in the bloodstream, or from the body not responding normally to insulin. Hyperglycemia occurs most often in people who have diabetes (diabetes mellitus), but it can happen in people who do not have diabetes. It can develop quickly, and it can be life-threatening if it causes you to become severely dehydrated (diabetic ketoacidosis or hyperglycemic hyperosmolar state). Severe hyperglycemia is a medical emergency. What are the causes? If you have diabetes, hyperglycemia may be caused by:  Diabetes medicine.  Medicines that increase blood glucose or affect your diabetes control.  Not eating enough, or not eating often enough.  Changes in physical activity level.  Being sick or having an infection. If you have prediabetes or undiagnosed diabetes:  Hyperglycemia may be caused by those conditions. If you do not have diabetes, hyperglycemia may be caused by:  Certain medicines, including steroid medicines, beta-blockers, epinephrine, and thiazide diuretics.  Stress.  Serious illness.  Surgery.  Diseases of the pancreas.  Infection. What increases the risk? Hyperglycemia is more likely to develop in people who have risk factors for diabetes, such as:  Having a family member with diabetes.  Having a gene for type 1 diabetes that is passed from parent to child (inherited).  Living in an area with cold weather conditions.  Exposure to certain viruses.  Certain conditions in which the body's disease-fighting (immune) system attacks itself (autoimmune disorders).  Being overweight or obese.  Having an inactive  (sedentary) lifestyle.  Having been diagnosed with insulin resistance.  Having a history of prediabetes, gestational diabetes, or polycystic ovarian syndrome (PCOS).  Being of American-Indian, African-American, Hispanic/Latino, or Asian/Pacific Islander descent. What are the signs or symptoms? Hyperglycemia may not cause any symptoms. If you do have symptoms, they may include early warning signs, such as:  Increased thirst.  Hunger.  Feeling very tired.  Needing to urinate more often than usual.  Blurry vision. Other symptoms may develop if hyperglycemia gets worse, such as:  Dry mouth.  Loss of appetite.  Fruity-smelling breath.  Weakness.  Unexpected or rapid weight gain or weight loss.  Tingling or numbness in the hands or feet.  Headache.  Skin that does not quickly return to normal after being lightly pinched and released (poor skin turgor).  Abdominal pain.  Cuts or bruises that are slow to heal. How is this diagnosed? Hyperglycemia is diagnosed with a blood test to measure your blood glucose level. This blood test is usually done while you are having symptoms. Your health care provider may also do a physical exam and review your medical history. You may have more tests to determine the cause of your hyperglycemia, such as:  A fasting blood glucose (FBG) test. You will not be allowed to eat (you will fast) for at least 8 hours before a blood sample is taken.  An A1c (hemoglobin A1c) blood test. This provides information about blood glucose control over the previous 2-3 months.  An oral glucose tolerance test (OGTT). This measures your blood glucose at two times: ? After fasting. This is your baseline blood glucose level. ? Two hours after drinking a beverage that contains glucose. How is this treated? Treatment depends on the cause   of your hyperglycemia. Treatment may include:  Taking medicine to regulate your blood glucose levels. If you take insulin or  other diabetes medicines, your medicine or dosage may be adjusted.  Lifestyle changes, such as exercising more, eating healthier foods, or losing weight.  Treating an illness or infection, if this caused your hyperglycemia.  Checking your blood glucose more often.  Stopping or reducing steroid medicines, if these caused your hyperglycemia. If your hyperglycemia becomes severe and it results in hyperglycemic hyperosmolar state, you must be hospitalized and given IV fluids. Follow these instructions at home:  General instructions  Take over-the-counter and prescription medicines only as told by your health care provider.  Do not use any products that contain nicotine or tobacco, such as cigarettes and e-cigarettes. If you need help quitting, ask your health care provider.  Limit alcohol intake to no more than 1 drink per day for nonpregnant women and 2 drinks per day for men. One drink equals 12 oz of beer, 5 oz of wine, or 1 oz of hard liquor.  Learn to manage stress. If you need help with this, ask your health care provider.  Keep all follow-up visits as told by your health care provider. This is important. Eating and drinking   Maintain a healthy weight.  Exercise regularly, as directed by your health care provider.  Stay hydrated, especially when you exercise, get sick, or spend time in hot temperatures.  Eat healthy foods, such as: ? Lean proteins. ? Complex carbohydrates. ? Fresh fruits and vegetables. ? Low-fat dairy products. ? Healthy fats.  Drink enough fluid to keep your urine clear or pale yellow. If you have diabetes:  Make sure you know the symptoms of hyperglycemia.  Follow your diabetes management plan, as told by your health care provider. Make sure you: ? Take your insulin and medicines as directed. ? Follow your exercise plan. ? Follow your meal plan. Eat on time, and do not skip meals. ? Check your blood glucose as often as directed. Make sure to  check your blood glucose before and after exercise. If you exercise longer or in a different way than usual, check your blood glucose more often. ? Follow your sick day plan whenever you cannot eat or drink normally. Make this plan in advance with your health care provider.  Share your diabetes management plan with people in your workplace, school, and household.  Check your urine for ketones when you are ill and as told by your health care provider.  Carry a medical alert card or wear medical alert jewelry. Contact a health care provider if:  Your blood glucose is at or above 240 mg/dL (13.3 mmol/L) for 2 days in a row.  You have problems keeping your blood glucose in your target range.  You have frequent episodes of hyperglycemia. Get help right away if:  You have difficulty breathing.  You have a change in how you think, feel, or act (mental status).  You have nausea or vomiting that does not go away. These symptoms may represent a serious problem that is an emergency. Do not wait to see if the symptoms will go away. Get medical help right away. Call your local emergency services (911 in the [place] Do not drive yourself to the hospital. Summary  Hyperglycemia occurs when the level of sugar (glucose) in the blood is too high.  Hyperglycemia is diagnosed with a blood test to measure your blood glucose level. This blood test is usually done while you are   having symptoms. Your health care provider may also do a physical exam and review your medical history.  If you have diabetes, follow your diabetes management plan as told by your health care provider.  Contact your health care provider if you have problems keeping your blood glucose in your target range. This information is not intended to replace advice given to you by your health care provider. Make sure you discuss any questions you have with your health care provider. Document Revised: 06/07/2016 Document Reviewed:  06/07/2016 Elsevier Patient Education  2020 Elsevier Inc.  

## 2019-11-08 ENCOUNTER — Ambulatory Visit: Admit: 2019-11-08 | Payer: Medicare Other

## 2019-11-08 ENCOUNTER — Ambulatory Visit: Payer: Medicare Other

## 2019-11-08 SURGERY — PHACOEMULSIFICATION, CATARACT, WITH IOL INSERTION
Anesthesia: Topical | Laterality: Left

## 2019-11-16 ENCOUNTER — Encounter: Payer: Self-pay | Admitting: Internal Medicine

## 2019-11-16 ENCOUNTER — Ambulatory Visit: Payer: Medicare Other

## 2019-11-19 ENCOUNTER — Other Ambulatory Visit: Payer: Self-pay | Admitting: Internal Medicine

## 2019-11-19 DIAGNOSIS — E1165 Type 2 diabetes mellitus with hyperglycemia: Secondary | ICD-10-CM

## 2019-11-19 MED ORDER — BLOOD GLUCOSE METER KIT: PACK | 0 refills | Status: DC

## 2019-11-22 ENCOUNTER — Telehealth: Payer: Self-pay | Admitting: Internal Medicine

## 2019-11-22 NOTE — Telephone Encounter (Signed)
Call pharmacy [facility] [address] and confirm meter went through glucose meter and supplies   Thanks Carlton

## 2019-11-23 NOTE — Telephone Encounter (Signed)
Spoke with patient's pharmacy. They states they did get this order, they are now waiting to find out from the patient what brand her insurance covers.   Called and informed the patient. She will contact her insurance and then let the pharmacy know.

## 2019-11-27 ENCOUNTER — Other Ambulatory Visit: Payer: Self-pay | Admitting: Internal Medicine

## 2019-11-27 DIAGNOSIS — E119 Type 2 diabetes mellitus without complications: Secondary | ICD-10-CM

## 2019-11-27 MED ORDER — ACCU-CHEK SOFTCLIX LANCETS MISC: 4 refills | Status: DC

## 2019-11-29 ENCOUNTER — Other Ambulatory Visit: Payer: Self-pay

## 2019-11-29 ENCOUNTER — Encounter: Payer: Self-pay | Admitting: Internal Medicine

## 2019-11-29 DIAGNOSIS — E119 Type 2 diabetes mellitus without complications: Secondary | ICD-10-CM

## 2019-11-30 ENCOUNTER — Other Ambulatory Visit: Payer: Self-pay

## 2019-11-30 DIAGNOSIS — E119 Type 2 diabetes mellitus without complications: Secondary | ICD-10-CM

## 2019-11-30 MED ORDER — ACCU-CHEK GUIDE VI STRP: ORAL_STRIP | 5 refills | Status: DC

## 2019-11-30 MED ORDER — ACCU-CHEK SOFTCLIX LANCETS MISC: 4 refills | Status: DC

## 2019-12-05 ENCOUNTER — Other Ambulatory Visit: Payer: Self-pay

## 2019-12-05 MED ORDER — BLOOD GLUCOSE MONITOR KIT: PACK | 0 refills | Status: DC

## 2019-12-14 ENCOUNTER — Encounter: Payer: Self-pay | Admitting: Internal Medicine

## 2019-12-14 ENCOUNTER — Other Ambulatory Visit: Payer: Self-pay

## 2019-12-14 MED ORDER — ACCU-CHEK GUIDE W/DEVICE KIT: 1.0000 | PACK | Freq: Every day | 0 refills | Status: AC

## 2019-12-28 ENCOUNTER — Ambulatory Visit
Admission: RE | Admit: 2019-12-28 | Discharge: 2019-12-28 | Disposition: A | Discharge status: Home / Self Care | Payer: Medicare Other | Source: Ambulatory Visit | Attending: Internal Medicine | Admitting: Internal Medicine

## 2019-12-28 ENCOUNTER — Other Ambulatory Visit: Payer: Self-pay

## 2019-12-28 DIAGNOSIS — R937 Abnormal findings on diagnostic imaging of other parts of musculoskeletal system: Secondary | ICD-10-CM

## 2019-12-28 DIAGNOSIS — M5124 Other intervertebral disc displacement, thoracic region: Secondary | ICD-10-CM | POA: Diagnosis not present

## 2019-12-28 DIAGNOSIS — Q7649 Other congenital malformations of spine, not associated with scoliosis: Secondary | ICD-10-CM | POA: Insufficient documentation

## 2019-12-28 DIAGNOSIS — M47814 Spondylosis without myelopathy or radiculopathy, thoracic region: Secondary | ICD-10-CM | POA: Diagnosis not present

## 2019-12-28 DIAGNOSIS — R935 Abnormal findings on diagnostic imaging of other abdominal regions, including retroperitoneum: Secondary | ICD-10-CM | POA: Diagnosis not present

## 2019-12-28 LAB — POCT I-STAT CREATININE: Creatinine, Ser: 0.6 mg/dL (ref 0.44–1.00)

## 2019-12-28 MED ORDER — GADOBUTROL 1 MMOL/ML IV SOLN
7.5000 mL | Freq: Once | INTRAVENOUS | Status: AC | PRN
  Administered 2019-12-28: 7.5 mL via INTRAVENOUS

## 2020-01-01 DIAGNOSIS — D2272 Melanocytic nevi of left lower limb, including hip: Secondary | ICD-10-CM | POA: Diagnosis not present

## 2020-01-01 DIAGNOSIS — D2262 Melanocytic nevi of left upper limb, including shoulder: Secondary | ICD-10-CM | POA: Diagnosis not present

## 2020-01-01 DIAGNOSIS — D225 Melanocytic nevi of trunk: Secondary | ICD-10-CM | POA: Diagnosis not present

## 2020-01-01 DIAGNOSIS — L814 Other melanin hyperpigmentation: Secondary | ICD-10-CM | POA: Diagnosis not present

## 2020-01-01 DIAGNOSIS — D485 Neoplasm of uncertain behavior of skin: Secondary | ICD-10-CM | POA: Diagnosis not present

## 2020-01-01 DIAGNOSIS — D2271 Melanocytic nevi of right lower limb, including hip: Secondary | ICD-10-CM | POA: Diagnosis not present

## 2020-01-01 DIAGNOSIS — X32XXXA Exposure to sunlight, initial encounter: Secondary | ICD-10-CM | POA: Diagnosis not present

## 2020-01-01 DIAGNOSIS — L821 Other seborrheic keratosis: Secondary | ICD-10-CM | POA: Diagnosis not present

## 2020-01-01 DIAGNOSIS — D2261 Melanocytic nevi of right upper limb, including shoulder: Secondary | ICD-10-CM | POA: Diagnosis not present

## 2020-01-16 ENCOUNTER — Telehealth: Payer: Self-pay | Admitting: Internal Medicine

## 2020-01-16 NOTE — Telephone Encounter (Signed)
McLean-Scocuzza, Nino Glow, MD  12/28/2019  5:09 PM EDT    Hemangioma seen which is a vascular lesion in the spine  Mild to moderate disc degeneration and arthritis at multiple levels and disc bulges   With history of breast cancer they rec follow up T spine MRI with contrast for stability    -I would rec hematology to consult for this and history of breast cancer , does she want referral?    Patient informed and verbalized understanding.  Pt is agreeable to referral in the [place] area.

## 2020-01-16 NOTE — Telephone Encounter (Signed)
The patient returned your call. Would like a call back to further discuss results as well as options for follow up.

## 2020-02-04 NOTE — Addendum Note (Signed)
Addended by: Orland Mustard on: 02/04/2020 01:05 PM   Modules accepted: Orders

## 2020-02-06 ENCOUNTER — Other Ambulatory Visit: Payer: Medicare Other

## 2020-02-08 ENCOUNTER — Other Ambulatory Visit (INDEPENDENT_AMBULATORY_CARE_PROVIDER_SITE_OTHER): Payer: Medicare Other

## 2020-02-08 ENCOUNTER — Other Ambulatory Visit: Payer: Self-pay

## 2020-02-08 DIAGNOSIS — E1165 Type 2 diabetes mellitus with hyperglycemia: Secondary | ICD-10-CM | POA: Diagnosis not present

## 2020-02-08 DIAGNOSIS — K760 Fatty (change of) liver, not elsewhere classified: Secondary | ICD-10-CM | POA: Diagnosis not present

## 2020-02-08 DIAGNOSIS — Z0184 Encounter for antibody response examination: Secondary | ICD-10-CM | POA: Diagnosis not present

## 2020-02-08 DIAGNOSIS — Z1329 Encounter for screening for other suspected endocrine disorder: Secondary | ICD-10-CM

## 2020-02-08 DIAGNOSIS — Z1159 Encounter for screening for other viral diseases: Secondary | ICD-10-CM

## 2020-02-08 DIAGNOSIS — I10 Essential (primary) hypertension: Secondary | ICD-10-CM

## 2020-02-08 LAB — COMPREHENSIVE METABOLIC PANEL
ALT: 59 U/L — ABNORMAL HIGH (ref 0–35)
AST: 40 U/L — ABNORMAL HIGH (ref 0–37)
Albumin: 4.7 g/dL (ref 3.5–5.2)
Alkaline Phosphatase: 47 U/L (ref 39–117)
BUN: 16 mg/dL (ref 6–23)
CO2: 27 mEq/L (ref 19–32)
Calcium: 9.3 mg/dL (ref 8.4–10.5)
Chloride: 99 mEq/L (ref 96–112)
Creatinine, Ser: 0.64 mg/dL (ref 0.40–1.20)
GFR: 91.99 mL/min (ref >60.00)
Glucose, Bld: 219 mg/dL — ABNORMAL HIGH (ref 70–99)
Potassium: 4.2 mEq/L (ref 3.5–5.1)
Sodium: 135 mEq/L (ref 135–145)
Total Bilirubin: 0.5 mg/dL (ref 0.2–1.2)
Total Protein: 7.1 g/dL (ref 6.0–8.3)

## 2020-02-08 LAB — HEMOGLOBIN A1C: Hgb A1c MFr Bld: 7.7 % — ABNORMAL HIGH (ref 4.6–6.5)

## 2020-02-08 LAB — CBC WITH DIFFERENTIAL/PLATELET
Basophils Absolute: 0.1 10*3/uL (ref 0.0–0.1)
Basophils Relative: 1.2 % (ref 0.0–3.0)
Eosinophils Absolute: 0.2 10*3/uL (ref 0.0–0.7)
Eosinophils Relative: 2.2 % (ref 0.0–5.0)
HCT: 37.8 % (ref 36.0–46.0)
Hemoglobin: 12.9 g/dL (ref 12.0–15.0)
Lymphocytes Relative: 29.8 % (ref 12.0–46.0)
Lymphs Abs: 3.3 10*3/uL (ref 0.7–4.0)
MCHC: 34.1 g/dL (ref 30.0–36.0)
MCV: 84 fl (ref 78.0–100.0)
Monocytes Absolute: 0.9 10*3/uL (ref 0.1–1.0)
Monocytes Relative: 7.6 % (ref 3.0–12.0)
Neutro Abs: 6.6 10*3/uL (ref 1.4–7.7)
Neutrophils Relative %: 59.2 % (ref 43.0–77.0)
Platelets: 247 10*3/uL (ref 150.0–400.0)
RBC: 4.51 Mil/uL (ref 3.87–5.11)
RDW: 13.6 % (ref 11.5–15.5)
WBC: 11.2 10*3/uL — ABNORMAL HIGH (ref 4.0–10.5)

## 2020-02-08 LAB — TSH: TSH: 2.3 u[IU]/mL (ref 0.35–4.50)

## 2020-02-08 LAB — LIPID PANEL
Cholesterol: 107 mg/dL (ref 0–200)
HDL: 31.9 mg/dL — ABNORMAL LOW (ref >39.00)
NonHDL: 75.53
Total CHOL/HDL Ratio: 3
Triglycerides: 209 mg/dL — ABNORMAL HIGH (ref 0.0–149.0)
VLDL: 41.8 mg/dL — ABNORMAL HIGH (ref 0.0–40.0)

## 2020-02-08 LAB — LDL CHOLESTEROL, DIRECT: Direct LDL: 52 mg/dL

## 2020-02-11 LAB — HEPATITIS A ANTIBODY, TOTAL: Hepatitis A AB,Total: NONREACTIVE

## 2020-02-28 ENCOUNTER — Inpatient Hospital Stay: Payer: Medicare Other | Admitting: Oncology

## 2020-02-28 ENCOUNTER — Inpatient Hospital Stay: Payer: Medicare Other

## 2020-03-06 ENCOUNTER — Ambulatory Visit: Payer: Medicare Other | Admitting: Internal Medicine

## 2020-03-13 ENCOUNTER — Other Ambulatory Visit: Payer: Self-pay

## 2020-03-13 ENCOUNTER — Inpatient Hospital Stay: Payer: Medicare Other

## 2020-03-13 ENCOUNTER — Inpatient Hospital Stay: Payer: Medicare Other | Attending: Oncology | Admitting: Oncology

## 2020-03-13 VITALS — BP 150/96 | HR 92 | Temp 97.6°F | Wt 164.9 lb

## 2020-03-13 DIAGNOSIS — I10 Essential (primary) hypertension: Secondary | ICD-10-CM

## 2020-03-13 DIAGNOSIS — Z87440 Personal history of urinary (tract) infections: Secondary | ICD-10-CM | POA: Diagnosis not present

## 2020-03-13 DIAGNOSIS — Z8261 Family history of arthritis: Secondary | ICD-10-CM | POA: Diagnosis not present

## 2020-03-13 DIAGNOSIS — M1990 Unspecified osteoarthritis, unspecified site: Secondary | ICD-10-CM | POA: Diagnosis not present

## 2020-03-13 DIAGNOSIS — Z823 Family history of stroke: Secondary | ICD-10-CM | POA: Diagnosis not present

## 2020-03-13 DIAGNOSIS — Z853 Personal history of malignant neoplasm of breast: Secondary | ICD-10-CM | POA: Diagnosis not present

## 2020-03-13 DIAGNOSIS — Z803 Family history of malignant neoplasm of breast: Secondary | ICD-10-CM | POA: Diagnosis not present

## 2020-03-13 DIAGNOSIS — E785 Hyperlipidemia, unspecified: Secondary | ICD-10-CM

## 2020-03-13 DIAGNOSIS — Z79899 Other long term (current) drug therapy: Secondary | ICD-10-CM | POA: Insufficient documentation

## 2020-03-13 DIAGNOSIS — I2510 Atherosclerotic heart disease of native coronary artery without angina pectoris: Secondary | ICD-10-CM | POA: Insufficient documentation

## 2020-03-13 DIAGNOSIS — Z833 Family history of diabetes mellitus: Secondary | ICD-10-CM | POA: Diagnosis not present

## 2020-03-13 DIAGNOSIS — K219 Gastro-esophageal reflux disease without esophagitis: Secondary | ICD-10-CM

## 2020-03-13 DIAGNOSIS — Z8249 Family history of ischemic heart disease and other diseases of the circulatory system: Secondary | ICD-10-CM

## 2020-03-13 DIAGNOSIS — Z800 Family history of malignant neoplasm of digestive organs: Secondary | ICD-10-CM | POA: Diagnosis not present

## 2020-03-13 DIAGNOSIS — E119 Type 2 diabetes mellitus without complications: Secondary | ICD-10-CM

## 2020-03-13 DIAGNOSIS — R937 Abnormal findings on diagnostic imaging of other parts of musculoskeletal system: Secondary | ICD-10-CM | POA: Insufficient documentation

## 2020-03-13 DIAGNOSIS — Z811 Family history of alcohol abuse and dependence: Secondary | ICD-10-CM

## 2020-03-16 NOTE — Progress Notes (Signed)
**Note Christy-Identified via Obfuscation** Hematology/Oncology Consult note [facility] Telephone:([phone] [phone]  Patient Care Team: Christy Stout, Christy Glow, MD as PCP - General (Internal Medicine) Christy Stout, [facility] as Pharmacist (Pharmacist)   Name of the patient: Christy Stout  [id number]  1950/10/25    Reason for referral- abnormal bone scan   Referring physician-Dr. Mclean-Scocuzza  Date of visit: 03/16/20   History of presenting illness- Patient is a 69 year old female with a past medical history significant for hypertension hyperlipidemia and diabetes who has been referred to [place] for abnormal spine imaging.  She underwent MRI of the thoracic spine with and without contrast as a follow-up of the CT that she had another 2020.  In November 2020 she was noted to have delineated sclerotic lesion in the T12 vertebral body MRI thoracic spine in March 2021 showed a 3 cm T12 vertebral body lesion which was favored to represent hemangioma as she was noted to have additional hemangiomas at T5 and T6 level.  Patient however has a personal history of breast cancer in 2000 that was treated outside of [place].  Displays ER/PR positive and likely HER-2 negative.  Patient underwent surgery radiation treatment as well as adjuvant chemotherapy and 5 years of hormone therapy and has been in remission since then.  Currently patient is doing well and denies any symptoms of back pain.  Appetite and weight have remained stable.  Denies any unintentional weight loss  ECOG PS- 0  Pain scale- 0   Review of systems- Review of Systems  Constitutional: Negative for chills, fever, malaise/fatigue and weight loss.  HENT: Negative for congestion, ear discharge and nosebleeds.   Eyes: Negative for blurred vision.  Respiratory: Negative for cough, hemoptysis, sputum production, shortness of breath and wheezing.   Cardiovascular: Negative for chest pain, palpitations, orthopnea and  claudication.  Gastrointestinal: Negative for abdominal pain, blood in stool, constipation, diarrhea, heartburn, melena, nausea and vomiting.  Genitourinary: Negative for dysuria, flank pain, frequency, hematuria and urgency.  Musculoskeletal: Negative for back pain, joint pain and myalgias.  Skin: Negative for rash.  Neurological: Negative for dizziness, tingling, focal weakness, seizures, weakness and headaches.  Endo/Heme/Allergies: Does not bruise/bleed easily.  Psychiatric/Behavioral: Negative for depression and suicidal ideas. The patient does not have insomnia.     No Known Allergies  Patient Active Problem List   Diagnosis Date Noted  . Diverticulosis 09/17/2019  . CAD (coronary artery disease) 09/17/2019  . GERD (gastroesophageal reflux disease) 09/17/2019  . Esophageal dysmotility 09/17/2019  . Fatty liver 03/28/2019  . Gallstones 03/28/2019  . Type 2 diabetes mellitus without complication, without long-term current use of insulin (Christy Stout) 10/11/2018  . Insomnia 06/13/2018  . HTN (hypertension) 03/16/2018  . HLD (hyperlipidemia) 03/16/2018  . Type 2 diabetes mellitus with hyperglycemia (Christy Stout) 03/16/2018  . History of right breast cancer 03/16/2018  . Positive Lyme disease serology 03/16/2018  . Breast cancer, right (Christy Stout) 2000     Past Medical History:  Diagnosis Date  . Arthritis   . Breast cancer, right (Christy Stout) 2000    lumpectomy 8 rounds chemo, 6 weeks radiation mammo 02/24/17 negative on Arimedex x 7 years   . Diabetes mellitus without complication (Christy Stout)   . History of chicken pox   . Hyperlipidemia   . Hypertension   . Personal history of chemotherapy 2000   Rt. Breast  . Personal history of radiation therapy 2000   Rt. breast  . UTI (urinary tract infection)      Past Surgical History:  Procedure  Laterality Date  . BREAST EXCISIONAL BIOPSY Right 2000   +  . BREAST SURGERY     2000  . WRIST SURGERY      Social History   Socioeconomic History  .  Marital status: Married    Spouse name: Not on file  . Number of children: Not on file  . Years of education: Not on file  . Highest education level: Not on file  Occupational History  . Not on file  Tobacco Use  . Smoking status: Never Smoker  . Smokeless tobacco: Never Used  Substance and Sexual Activity  . Alcohol use: Yes  . Drug use: Not Currently  . Sexual activity: Yes    Partners: Male  Other Topics Concern  . 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 Difficulty of Paying Living Expenses:   Food Insecurity:   . Worried About Charity fundraiser in the Last Year:   . Arboriculturist in the Last Year:   Transportation Needs:   . Film/video editor (Medical):   Marland Kitchen Lack of Transportation (Non-Medical):   Physical Activity:   . Days of Exercise per Week:   . Minutes of Exercise per Session:   Stress:   . Feeling of Stress :   Social Connections:   . Frequency of Communication with Friends and Family:   . Frequency of Social Gatherings with Friends and Family:   . Attends Religious Services:   . Active Member of Clubs or Organizations:   . Attends Archivist Meetings:   Marland Kitchen Marital Status:   Intimate Partner Violence:   . Fear of Current or Ex-Partner:   . Emotionally Abused:   Marland Kitchen Physically Abused:   . Sexually Abused:      Family History  Problem Relation Age of Onset  . Cancer Mother        colon age 48  . Heart disease Father   . Stroke Father   . Arthritis Brother   . Diabetes Brother   . Cancer Maternal Grandmother        breast   . Breast cancer Maternal Grandmother   . Heart disease Maternal Grandfather   .  Arthritis Brother   . Alcohol abuse Son   . Hypertension Son      Current Outpatient Medications:  .  Accu-Chek Softclix Lancets lancets, Used to check blood sugars once per day. Softclix lancets or ask pt which she prefers, Disp: 100 each, Rfl: 4 .  aspirin EC 81 MG tablet, Take 81 mg by mouth daily., Disp: , Rfl:  .  blood glucose meter kit and supplies KIT, Dispense based on patient and insurance preference. Use up to four times daily as directed. (FOR ICD-9 250.00, 250.01)., Disp: 1 each, Rfl: 0 .  blood glucose meter kit and supplies, Dispense based on patient and insurance preference. Bid  directed. (FOR ICD-10 E10.9, E11.9). 1 year supply strips and lancets, Disp: 1 each, Rfl: 0 .  Blood Glucose Monitoring Suppl (ACCU-CHEK GUIDE) w/Device KIT, 1 Device by  Does not apply route daily., Disp: 1 kit, Rfl: 0 .  glipiZIDE (GLUCOTROL XL) 10 MG 24 hr tablet, Take 1 tablet (10 mg total) by mouth daily with breakfast., Disp: 90 tablet, Rfl: 3 .  glucose blood (ACCU-CHEK GUIDE) test strip, Used to check blood sugar once per day E11.9., Disp: 100 each, Rfl: 5 .  losartan-hydrochlorothiazide (HYZAAR) 100-25 MG tablet, Take 1 tablet by mouth daily. In am, Disp: 90 tablet, Rfl: 3 .  meloxicam (MOBIC) 15 MG tablet, Take 1 tablet (15 mg total) by mouth daily as needed for pain., Disp: 90 tablet, Rfl: 3 .  metFORMIN (GLUCOPHAGE-XR) 500 MG 24 hr tablet, Take 1,000 mg by mouth 2 (two) times daily with a meal., Disp: , Rfl:  .  Multiple Vitamin (MULTI-VITAMIN DAILY PO), Take by mouth., Disp: , Rfl:  .  Omega-3 Fatty Acids (FISH OIL PO), Take by mouth., Disp: , Rfl:  .  rosuvastatin (CRESTOR) 10 MG tablet, Take 1 tablet (10 mg total) by mouth at bedtime., Disp: 90 tablet, Rfl: 3 .  Vitamin D, Cholecalciferol, 1000 units CAPS, Take 2,000 Int'l Units/day by mouth. , Disp: , Rfl:  .  zolpidem (AMBIEN) 5 MG tablet, Take 1 tablet (5 mg total) by mouth at bedtime as needed for sleep., Disp: 90 tablet, Rfl:  1   Physical exam:  Vitals:   03/13/20 1342  BP: (!) 150/96  Pulse: 92  Temp: 97.6 F (36.4 C)  SpO2: 95%  Weight: 164 lb 14.4 oz (74.8 kg)   Physical Exam HENT:     Head: Normocephalic and atraumatic.  Eyes:     Pupils: Pupils are equal, round, and reactive to light.  Cardiovascular:     Rate and Rhythm: Normal rate and regular rhythm.     Heart sounds: Normal heart sounds.  Pulmonary:     Effort: Pulmonary effort is normal.     Breath sounds: Normal breath sounds.  Abdominal:     General: Bowel sounds are normal.     Palpations: Abdomen is soft.     Comments: No palpable hepatosplenomegaly  Musculoskeletal:     Cervical back: Normal range of motion.  Lymphadenopathy:     Comments: No palpable cervical, supraclavicular, axillary or inguinal adenopathy   Skin:    General: Skin is warm and dry.  Neurological:     Mental Status: She is alert and oriented to person, place, and time.        CMP Latest Ref Rng & Units 02/08/2020  Glucose 70 - 99 mg/dL 219(H)  BUN 6 - 23 mg/dL 16  Creatinine 0.40 - 1.20 mg/dL 0.64  Sodium 135 - 145 mEq/L 135  Potassium 3.5 - 5.1 mEq/L 4.2  Chloride 96 - 112 mEq/L 99  CO2 19 - 32 mEq/L 27  Calcium 8.4 - 10.5 mg/dL 9.3  Total Protein 6.0 - 8.3 g/dL 7.1  Total Bilirubin 0.2 - 1.2 mg/dL 0.5  Alkaline Phos 39 - 117 U/L 47  AST 0 - 37 U/L 40(H)  ALT 0 - 35 U/L 59(H)   CBC Latest Ref Rng & Units 02/08/2020  WBC 4.0 - 10.5 K/uL 11.2(H)  Hemoglobin 12.0 - 15.0 g/dL 12.9  Hematocrit 36 - 46 % 37.8  Platelets 150 - 400 K/uL 247.0    Assessment and plan- Patient is a 69 y.o. female with history of breast cancer 21 years ago referred for abnormal thoracic spine imaging.  I have reviewed MRI thoracic spine images independently and discussed findings with the patient.  Patient noted to have a 3 cm sclerotic lesion in the T12 vertebral body area which was first noted incidentally on a CT abdomen in November 2020 and has not changed  significantly after 4 months on repeat MRI.  This is favored to represent hemangioma as she had other hemangiomas at T5 and T6 levels.  Her most recent mammogram in August 2020 was unremarkable.  Breast exam today did not reveal any palpable breast masses or palpable axillary adenopathy.  I would favor repeating her MRI sometime end of next month.  If there is no significant change in this lesion I will discuss with radiology if this is something that needs to be monitored or biopsied.  Suspicion of malignancy is low at this time since her breast cancer was over 20 years ago and the lesion itself does not look very typical for neoplastic process.  Patient verbalized understanding   Thank you for this kind referral and the opportunity to participate in the care of this  Patient   Visit Diagnosis 1. Abnormal MRI, spine   2. Hx of breast cancer     Dr. Randa Evens, MD, MPH [facility] [phone] 03/16/2020

## 2020-04-03 ENCOUNTER — Ambulatory Visit (INDEPENDENT_AMBULATORY_CARE_PROVIDER_SITE_OTHER): Payer: Medicare Other | Admitting: Internal Medicine

## 2020-04-03 ENCOUNTER — Encounter: Payer: Self-pay | Admitting: Internal Medicine

## 2020-04-03 ENCOUNTER — Other Ambulatory Visit: Payer: Self-pay

## 2020-04-03 VITALS — BP 152/88 | HR 97 | Temp 98.4°F | Ht 67.0 in | Wt 165.2 lb

## 2020-04-03 DIAGNOSIS — I10 Essential (primary) hypertension: Secondary | ICD-10-CM

## 2020-04-03 DIAGNOSIS — I152 Hypertension secondary to endocrine disorders: Secondary | ICD-10-CM | POA: Insufficient documentation

## 2020-04-03 DIAGNOSIS — E1159 Type 2 diabetes mellitus with other circulatory complications: Secondary | ICD-10-CM

## 2020-04-03 DIAGNOSIS — E663 Overweight: Secondary | ICD-10-CM

## 2020-04-03 DIAGNOSIS — H8113 Benign paroxysmal vertigo, bilateral: Secondary | ICD-10-CM

## 2020-04-03 DIAGNOSIS — Z1231 Encounter for screening mammogram for malignant neoplasm of breast: Secondary | ICD-10-CM

## 2020-04-03 DIAGNOSIS — E119 Type 2 diabetes mellitus without complications: Secondary | ICD-10-CM | POA: Diagnosis not present

## 2020-04-03 HISTORY — DX: Overweight: E66.3

## 2020-04-03 MED ORDER — AMLODIPINE BESYLATE 2.5 MG PO TABS
2.5000 mg | ORAL_TABLET | Freq: Every day | ORAL | 0 refills | Status: DC
Start: 2020-04-03 — End: 2020-05-05

## 2020-04-03 MED ORDER — RYBELSUS 3 MG PO TABS: 1.0000 | ORAL_TABLET | Freq: Every day | ORAL | 3 refills | Status: DC

## 2020-04-03 MED ORDER — RYBELSUS 7 MG PO TABS: 1.0000 | ORAL_TABLET | Freq: Every day | ORAL | 3 refills | Status: DC

## 2020-04-03 MED ORDER — MECLIZINE HCL 12.5 MG PO TABS: 12.5000 mg | ORAL_TABLET | Freq: Two times a day (BID) | ORAL | 0 refills | Status: DC | PRN

## 2020-04-03 MED ORDER — RYBELSUS 3 MG PO TABS: 1.0000 | ORAL_TABLET | Freq: Every day | ORAL | 0 refills | Status: DC

## 2020-04-03 NOTE — Patient Instructions (Addendum)
Fall let me know about colonoscopy  How to Perform the Epley Maneuver The Epley maneuver is an exercise that relieves symptoms of vertigo. Vertigo is the feeling that you or your surroundings are moving when they are not. When you feel vertigo, you may feel like the room is spinning and have trouble walking. Dizziness is a little different than vertigo. When you are dizzy, you may feel unsteady or light-headed. You can do this maneuver at home whenever you have symptoms of vertigo. You can do it up to 3 times a day until your symptoms go away. Even though the Epley maneuver may relieve your vertigo for a few weeks, it is possible that your symptoms will return. This maneuver relieves vertigo, but it does not relieve dizziness. What are the risks? If it is done correctly, the Epley maneuver is considered safe. Sometimes it can lead to dizziness or nausea that goes away after a short time. If you develop other symptoms, such as changes in vision, weakness, or numbness, stop doing the maneuver and call your health care provider. How to perform the Epley maneuver 1. Sit on the edge of a bed or table with your back straight and your legs extended or hanging over the edge of the bed or table. 2. Turn your head halfway toward the affected ear or side. 3. Lie backward quickly with your head turned until you are lying flat on your back. You may want to position a pillow under your shoulders. 4. Hold this position for 30 seconds. You may experience an attack of vertigo. This is normal. 5. Turn your head to the opposite direction until your unaffected ear is facing the floor. 6. Hold this position for 30 seconds. You may experience an attack of vertigo. This is normal. Hold this position until the vertigo stops. 7. Turn your whole body to the same side as your head. Hold for another 30 seconds. 8. Sit back up. You can repeat this exercise up to 3 times a day. Follow these instructions at home:  After doing  the Epley maneuver, you can return to your normal activities.  Ask your health care provider if there is anything you should do at home to prevent vertigo. He or she may recommend that you: ? Keep your head raised (elevated) with two or more pillows while you sleep. ? Do not sleep on the side of your affected ear. ? Get up slowly from bed. ? Avoid sudden movements during the day. ? Avoid extreme head movement, like looking up or bending over. Contact a health care provider if:  Your vertigo gets worse.  You have other symptoms, including: ? Nausea. ? Vomiting. ? Headache. Get help right away if:  You have vision changes.  You have a severe or worsening headache or neck pain.  You cannot stop vomiting.  You have new numbness or weakness in any part of your body. Summary  Vertigo is the feeling that you or your surroundings are moving when they are not.  The Epley maneuver is an exercise that relieves symptoms of vertigo.  If the Epley maneuver is done correctly, it is considered safe. You can do it up to 3 times a day. This information is not intended to replace advice given to you by your health care provider. Make sure you discuss any questions you have with your health care provider. Document Revised: 09/02/2017 Document Reviewed: 08/10/2016 Elsevier Patient Education  Orrum tablets or capsules What is this  medicine? MECLIZINE (MEK li zeen) is an antihistamine. It is used to prevent nausea, vomiting, or dizziness caused by motion sickness. It is also used to prevent and treat vertigo (extreme dizziness or a feeling that you or your surroundings are tilting or spinning around). This medicine may be used for other purposes; ask your health care provider or pharmacist if you have questions. COMMON BRAND NAME(S): Antivert, Dramamine Less Drowsy, Dramamine-N, Medivert, Meni-D What should I tell my health care provider before I take this medicine? They  need to know if you have any of these conditions:  glaucoma  lung or breathing disease, like asthma  problems urinating  prostate disease  stomach or intestine problems  an unusual or allergic reaction to meclizine, other medicines, foods, dyes, or preservatives  pregnant or trying to get pregnant  breast-feeding How should I use this medicine? Take this medicine by mouth with a glass of water. Follow the directions on the prescription label. If you are using this medicine to prevent motion sickness, take the dose at least 1 hour before travel. If it upsets your stomach, take it with food or milk. Take your doses at regular intervals. Do not take your medicine more often than directed. Talk to your pediatrician regarding the use of this medicine in children. Special care may be needed. Overdosage: If you think you have taken too much of this medicine contact a poison control center or emergency room at once. NOTE: This medicine is only for you. Do not share this medicine with others. What if I miss a dose? If you miss a dose, take it as soon as you can. If it is almost time for your next dose, take only that dose. Do not take double or extra doses. What may interact with this medicine? Do not take this medicine with any of the following medications:  MAOIs like Carbex, Eldepryl, Marplan, Nardil, and Parnate This medicine may also interact with the following medications:  alcohol  antihistamines for allergy, cough and cold  certain medicines for anxiety or sleep  certain medicines for depression, like amitriptyline, fluoxetine, sertraline  certain medicines for seizures like phenobarbital, primidone  general anesthetics like halothane, isoflurane, methoxyflurane, propofol  local anesthetics like lidocaine, pramoxine, tetracaine  medicines that relax muscles for surgery  narcotic medicines for pain  phenothiazines like chlorpromazine, mesoridazine, prochlorperazine,  thioridazine This list may not describe all possible interactions. Give your health care provider a list of all the medicines, herbs, non-prescription drugs, or dietary supplements you use. Also tell them if you smoke, drink alcohol, or use illegal drugs. Some items may interact with your medicine. What should I watch for while using this medicine? Tell your doctor or healthcare professional if your symptoms do not start to get better or if they get worse. You may get drowsy or dizzy. Do not drive, use machinery, or do anything that needs mental alertness until you know how this medicine affects you. Do not stand or sit up quickly, especially if you are an older patient. This reduces the risk of dizzy or fainting spells. Alcohol may interfere with the effect of this medicine. Avoid alcoholic drinks. Your mouth may get dry. Chewing sugarless gum or sucking hard candy, and drinking plenty of water may help. Contact your doctor if the problem does not go away or is severe. This medicine may cause dry eyes and blurred vision. If you wear contact lenses you may feel some discomfort. Lubricating drops may help. See your eye doctor if  the problem does not go away or is severe. What side effects may I notice from receiving this medicine? Side effects that you should report to your doctor or health care professional as soon as possible:  feeling faint or lightheaded, falls  fast, irregular heartbeat Side effects that usually do not require medical attention (report to your doctor or health care professional if they continue or are bothersome):  constipation  headache  trouble passing urine or change in the amount of urine  trouble sleeping  upset stomach This list may not describe all possible side effects. Call your doctor for medical advice about side effects. You may report side effects to FDA at 1-800-FDA-1088. Where should I keep my medicine? Keep out of the reach of children. Store at room  temperature between 15 and 30 degrees C (59 and 86 degrees F). Keep container tightly closed. Throw away any unused medicine after the expiration date. NOTE: This sheet is a summary. It may not cover all possible information. If you have questions about this medicine, talk to your doctor, pharmacist, or health care provider.  2020 Elsevier/Gold Standard (2015-10-22 19:41:02)  Vertigo Vertigo is the feeling that you or your surroundings are moving when they are not. This feeling can come and go at any time. Vertigo often goes away on its own. Vertigo can be dangerous if it occurs while you are doing something that could endanger you or others, such as driving or operating machinery. Your health care provider will do tests to determine the cause of your vertigo. Tests will also help your health care provider decide how best to treat your condition. Follow these instructions at home: Eating and drinking      Drink enough fluid to keep your urine pale yellow.  Do not drink alcohol. Activity  Return to your normal activities as told by your health care provider. Ask your health care provider what activities are safe for you.  In the morning, first sit up on the side of the bed. When you feel okay, stand slowly while you hold onto something until you know that your balance is fine.  Move slowly. Avoid sudden body or head movements or certain positions, as told by your health care provider.  If you have trouble walking or keeping your balance, try using a cane for stability. If you feel dizzy or unstable, sit down right away.  Avoid doing any tasks that would cause danger to you or others if vertigo occurs.  Avoid bending down if you feel dizzy. Place items in your home so that they are easy for you to reach without leaning over.  Do not drive or use heavy machinery if you feel dizzy. General instructions  Take over-the-counter and prescription medicines only as told by your health care  provider.  Keep all follow-up visits as told by your health care provider. This is important. Contact a health care provider if:  Your medicines do not relieve your vertigo or they make it worse.  You have a fever.  Your condition gets worse or you develop new symptoms.  Your family or friends notice any behavioral changes.  Your nausea or vomiting gets worse.  You have numbness or a prickling and tingling sensation in part of your body. Get help right away if you:  Have difficulty moving or speaking.  Are always dizzy.  Faint.  Develop severe headaches.  Have weakness in your hands, arms, or legs.  Have changes in your hearing or vision.  Develop  a stiff neck.  Develop sensitivity to light. Summary  Vertigo is the feeling that you or your surroundings are moving when they are not.  Your health care provider will do tests to determine the cause of your vertigo.  Follow instructions for home care. You may be told to avoid certain tasks, positions, or movements.  Contact a health care provider if your medicines do not relieve your symptoms, or if you have a fever, nausea, vomiting, or changes in behavior.  Get help right away if you have severe headaches or difficulty speaking, or you develop hearing or vision problems. This information is not intended to replace advice given to you by your health care provider. Make sure you discuss any questions you have with your health care provider. Document Revised: 08/14/2018 Document Reviewed: 08/14/2018 Elsevier Patient Education  2020 [place] oral tablets What is this medicine? SEMAGLUTIDE (Sem a GLOO tide) is used to improve blood sugar control in adults with type 2 diabetes. This medicine may be used with other diabetes medicines. This medicine may be used for other purposes; ask your health care provider or pharmacist if you have questions. COMMON BRAND NAME(S): Rybelsus What should I tell my health  care provider before I take this medicine? They need to know if you have any of these conditions:  endocrine tumors (MEN 2) or if someone in your family had these tumors  eye disease, vision problems  history of pancreatitis  kidney disease  stomach problems  thyroid cancer or if someone in your family had thyroid cancer  an unusual or allergic reaction to semaglutide, other medicines, foods, dyes, or preservatives  pregnant or trying to get pregnant  breast-feeding How should I use this medicine? Take this medicine by mouth with a glass of plain water that is less than 4 ounces (less than 120 mL). Follow the directions on the prescription label. Do not cut, crush or chew this medicine. Swallow the tablets whole. Take at least 30 minutes before the first food, other beverage, or other oral medications of the day. Take your medicine at regular intervals. Do not take your medicine more often than directed. Do not stop taking except on your doctor's advice. A special MedGuide will be given to you by the pharmacist with each prescription and refill. Be sure to read this information carefully each time. Talk to your pediatrician regarding the use of this medicine in children. Special care may be needed. Overdosage: If you think you have taken too much of this medicine contact a poison control center or emergency room at once. NOTE: This medicine is only for you. Do not share this medicine with others. What if I miss a dose? If you miss a dose, skip it. Take your next dose at the normal time. Do not take extra or 2 doses at the same time to make up for the missed dose. What may interact with this medicine? What may interact with this medicine?  aminophylline  carbamazepine  cyclosporine  digoxin  levothyroxine  other medicines for diabetes  phenytoin  tacrolimus  theophylline  warfarin Many medications may cause changes in blood sugar, these include:  alcohol containing  beverages  antiviral medicines for HIV or AIDS  aspirin and aspirin-like drugs  certain medicines for blood pressure, heart disease, irregular heart beat  chromium  diuretics  female hormones, such as estrogens or progestins, birth control pills  fenofibrate  gemfibrozil  isoniazid  lanreotide  female hormones or anabolic steroids  MAOIs like Carbex, Eldepryl, Marplan, Nardil, and Parnate  medicines for weight loss  medicines for allergies, asthma, cold, or cough  medicines for depression, anxiety, or psychotic disturbances  niacin  nicotine  NSAIDs, medicines for pain and inflammation, like ibuprofen or naproxen  octreotide  pasireotide  pentamidine  phenytoin  probenecid  quinolone antibiotics such as ciprofloxacin, levofloxacin, ofloxacin  some herbal dietary supplements  steroid medicines such as prednisone or cortisone  sulfamethoxazole; trimethoprim  thyroid hormones Some medications can hide the warning symptoms of low blood sugar (hypoglycemia). You may need to monitor your blood sugar more closely if you are taking one of these medications. These include:  beta-blockers, often used for high blood pressure or heart problems (examples include atenolol, metoprolol, propranolol)  clonidine  guanethidine  reserpine This list may not describe all possible interactions. Give your health care provider a list of all the medicines, herbs, non-prescription drugs, or dietary supplements you use. Also tell them if you smoke, drink alcohol, or use illegal drugs. Some items may interact with your medicine. What should I watch for while using this medicine? Visit your doctor or health care professional for regular checks on your progress. Drink plenty of fluids while taking this medicine. Check with your doctor or health care professional if you get an attack of severe diarrhea, nausea, and vomiting. The loss of too much body fluid can make it dangerous for  you to take this medicine. A test called the HbA1C (A1C) will be monitored. This is a simple blood test. It measures your blood sugar control over the last 2 to 3 months. You will receive this test every 3 to 6 months. Learn how to check your blood sugar. Learn the symptoms of low and high blood sugar and how to manage them. Always carry a quick-source of sugar with you in case you have symptoms of low blood sugar. Examples include hard sugar candy or glucose tablets. Make sure others know that you can choke if you eat or drink when you develop serious symptoms of low blood sugar, such as seizures or unconsciousness. They must get medical help at once. Tell your doctor or health care professional if you have high blood sugar. You might need to change the dose of your medicine. If you are sick or exercising more than usual, you might need to change the dose of your medicine. Do not skip meals. Ask your doctor or health care professional if you should avoid alcohol. Many nonprescription cough and cold products contain sugar or alcohol. These can affect blood sugar. Wear a medical ID bracelet or chain, and carry a card that describes your disease and details of your medicine and dosage times. Do not become pregnant while taking this medicine. Women should inform their doctor if they wish to become pregnant or think they might be pregnant. There is a potential for serious side effects to an unborn child. Talk to your health care professional or pharmacist for more information. Do not breast-feed an infant while taking this medicine. What side effects may I notice from receiving this medicine? Side effects that you should report to your doctor or health care professional as soon as possible:  allergic reactions like skin rash, itching or hives, swelling of the face, lips, or tongue  breathing problems  changes in vision  diarrhea that continues or is severe  lump or swelling on the neck  severe  nausea  signs and symptoms of infection like fever or chills; cough; sore throat;  pain or trouble passing urine  signs and symptoms of low blood sugar such as feeling anxious, confusion, dizziness, increased hunger, unusually weak or tired, sweating, shakiness, cold, irritable, headache, blurred vision, fast heartbeat, loss of consciousness  signs and symptoms of kidney injury like trouble passing urine or change in the amount of urine  trouble swallowing  unusual stomach upset or pain  vomiting Side effects that usually do not require medical attention (report these to your doctor or health care professional if they continue or are bothersome):  constipation  diarrhea  nausea  stomach upset This list may not describe all possible side effects. Call your doctor for medical advice about side effects. You may report side effects to FDA at 1-800-FDA-1088. Where should I keep my medicine? Keep out of the reach of children. Store at room temperature between 15 and 30 degrees C (59 and 86 degrees F). Keep this medicine in the original blister card until use. Keep in a dry place. Throw away any unused medicine after the expiration date. NOTE: This sheet is a summary. It may not cover all possible information. If you have questions about this medicine, talk to your doctor, pharmacist, or health care provider.  2020 Elsevier/Gold Standard (2018-06-27 10:07:43)

## 2020-04-03 NOTE — Progress Notes (Signed)
Patient presenting with high blood pressure today. States when checking at home it has been border line high (top number [phone]/ [phone]). States she has been having some dizziness.   Patient flagged: Current status:  OVERDUE FOR COLORECTAL CANCER SCREENING Current status:  PATIENT IS OVERDUE FOR BMI FOLLOW UP PLAN BMI is estimated to be 25.9 based on the last recorded weight and height

## 2020-04-03 NOTE — Progress Notes (Signed)
Chief Complaint  Patient presents with  . Follow-up   F/u  1. HTN on hyzaar 100-25 and BP elevated 120s/130s at home and MD appts 140s-150sw/o sxs  2. DM 2 A1C 7.7 uncontrolled on glipizide 10 xl, metformin xr 1000 mg bid she wans to try rybelsus or get back on januvi a 3. C/o dizziness this week the room was spinning checked cbg and was 160 and did not check BP    Review of Systems  Constitutional: Negative for weight loss.  HENT: Negative for hearing loss.   Eyes: Negative for blurred vision.  Respiratory: Negative for shortness of breath.   Cardiovascular: Negative for chest pain.  Gastrointestinal: Negative for abdominal pain.  Musculoskeletal: Negative for falls.  Skin: Negative for rash.  Neurological: Positive for dizziness.  Psychiatric/Behavioral: Negative for depression and memory loss.   Past Medical History:  Diagnosis Date  . Arthritis   . Breast cancer, right ([place]) 2000    lumpectomy 8 rounds chemo, 6 weeks radiation mammo 02/24/17 negative on Arimedex x 7 years   . Diabetes mellitus without complication ([place])   . History of chicken pox   . Hyperlipidemia   . Hypertension   . Personal history of chemotherapy 2000   Rt. Breast  . Personal history of radiation therapy 2000   Rt. breast  . UTI (urinary tract infection)    Past Surgical History:  Procedure Laterality Date  . BREAST EXCISIONAL BIOPSY Right 2000   +  . BREAST SURGERY     2000  . WRIST SURGERY     Family History  Problem Relation Age of Onset  . Cancer Mother        colon age 38  . Heart disease Father   . Stroke Father   . Arthritis Brother   . Diabetes Brother   . Cancer Maternal Grandmother        breast   . Breast cancer Maternal Grandmother   . Heart disease Maternal Grandfather   . Arthritis Brother   . Alcohol abuse Son   . Hypertension Son    Social History   Socioeconomic History  . Marital status: Married    Spouse name: Not on file  . Number of children: Not on file   . Years of education: Not on file  . Highest education level: Not on file  Occupational History  . Not on file  Tobacco Use  . Smoking status: Never Smoker  . Smokeless tobacco: Never Used  Substance and Sexual Activity  . Alcohol use: Yes  . Drug use: Not Currently  . Sexual activity: Yes    Partners: Male  Other Topics Concern  . 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 Difficulty of Paying Living Expenses:   Food Insecurity:   . Worried About Charity fundraiser in the Last Year:   . Arboriculturist in the Last Year:   Transportation Needs:   . Film/video editor (Medical):   Marland Kitchen Lack of Transportation (Non-Medical):   Physical Activity:   .  Days of Exercise per Week:   . Minutes of Exercise per Session:   Stress:   . Feeling of Stress :   Social Connections:   . Frequency of Communication with Friends and Family:   . Frequency of Social Gatherings with Friends and Family:   . Attends Religious Services:   . Active Member of Clubs or Organizations:   . Attends Archivist Meetings:   Marland Kitchen Marital Status:   Intimate Partner Violence:   . Fear of Current or Ex-Partner:   . Emotionally Abused:   Marland Kitchen Physically Abused:   . Sexually Abused:    Current Meds  Medication Sig  . Accu-Chek Softclix Lancets lancets Used to check blood sugars once per day. Softclix lancets or ask pt which she prefers  . aspirin EC 81 MG tablet Take 81 mg by mouth daily.  . blood glucose meter kit and supplies KIT Dispense based on patient and insurance preference. Use up to four times daily as directed. (FOR ICD-9 250.00, 250.01).  . blood glucose meter  kit and supplies Dispense based on patient and insurance preference. Bid  directed. (FOR ICD-10 E10.9, E11.9). 1 year supply strips and lancets  . Blood Glucose Monitoring Suppl (ACCU-CHEK GUIDE) w/Device KIT 1 Device by Does not apply route daily.  Marland Kitchen glipiZIDE (GLUCOTROL XL) 10 MG 24 hr tablet Take 1 tablet (10 mg total) by mouth daily with breakfast.  . glucose blood (ACCU-CHEK GUIDE) test strip Used to check blood sugar once per day E11.9.  . losartan-hydrochlorothiazide (HYZAAR) 100-25 MG tablet Take 1 tablet by mouth daily. In am  . Magnesium 250 MG TABS Take by mouth daily.  . meloxicam (MOBIC) 15 MG tablet Take 1 tablet (15 mg total) by mouth daily as needed for pain.  . metFORMIN (GLUCOPHAGE-XR) 500 MG 24 hr tablet Take 1,000 mg by mouth 2 (two) times daily with a meal.  . Multiple Vitamin (MULTI-VITAMIN DAILY PO) Take by mouth.  . Omega-3 Fatty Acids (FISH OIL PO) Take by mouth.  . rosuvastatin (CRESTOR) 10 MG tablet Take 1 tablet (10 mg total) by mouth at bedtime.  . Vitamin D, Cholecalciferol, 1000 units CAPS Take 2,000 Int'l Units/day by mouth.   . zolpidem (AMBIEN) 5 MG tablet Take 1 tablet (5 mg total) by mouth at bedtime as needed for sleep.   Allergies  Allergen Reactions  . Lisinopril     Cough    Recent Results (from the past 2160 hour(s))  Hepatitis A Ab, Total     Status: None   Collection Time: 02/08/20  8:01 AM  Result Value Ref Range   Hepatitis A AB,Total NON-REACTIVE NON-REACTI    Comment: . For additional information, please refer to  http://education.quest[facility].com/faq/FAQ202  (This link is being provided for informational/ educational purposes only.) .   TSH     Status: None   Collection Time: 02/08/20  8:01 AM  Result Value Ref Range   TSH 2.30 0.35 - 4.50 uIU/mL  CBC with Differential/Platelet     Status: Abnormal   Collection Time: 02/08/20  8:01 AM  Result Value Ref Range   WBC 11.2 (H) 4.0 - 10.5 K/uL   RBC 4.51 3.87 - 5.11 Mil/uL    Hemoglobin 12.9 12.0 - 15.0 g/dL   HCT 37.8 36 - 46 %   MCV 84.0 78.0 - 100.0 fl   MCHC 34.1 30.0 - 36.0 g/dL   RDW 13.6 11.5 - 15.5 %   Platelets 247.0 150 - 400 K/uL  Neutrophils Relative % 59.2 43 - 77 %   Lymphocytes Relative 29.8 12 - 46 %   Monocytes Relative 7.6 3 - 12 %   Eosinophils Relative 2.2 0 - 5 %   Basophils Relative 1.2 0 - 3 %   Neutro Abs 6.6 1.4 - 7.7 K/uL   Lymphs Abs 3.3 0.7 - 4.0 K/uL   Monocytes Absolute 0.9 0 - 1 K/uL   Eosinophils Absolute 0.2 0 - 0 K/uL   Basophils Absolute 0.1 0 - 0 K/uL  Hemoglobin A1c     Status: Abnormal   Collection Time: 02/08/20  8:01 AM  Result Value Ref Range   Hgb A1c MFr Bld 7.7 (H) 4.6 - 6.5 %    Comment: Glycemic Control Guidelines for People with Diabetes:Non Diabetic:  <6%Goal of Therapy: <7%Additional Action Suggested:  >8%   Lipid panel     Status: Abnormal   Collection Time: 02/08/20  8:01 AM  Result Value Ref Range   Cholesterol 107 0 - 200 mg/dL    Comment: ATP III Classification       Desirable:  < 200 mg/dL               Borderline High:  200 - 239 mg/dL          High:  > = 240 mg/dL   Triglycerides 209.0 (H) 0 - 149 mg/dL    Comment: Normal:  <150 mg/dLBorderline High:  150 - 199 mg/dL   HDL 31.90 (L) >39.00 mg/dL   VLDL 41.8 (H) 0.0 - 40.0 mg/dL   Total CHOL/HDL Ratio 3     Comment:                Men          Women1/2 Average Risk     3.4          3.3Average Risk          5.0          4.42X Average Risk          9.6          7.13X Average Risk          15.0          11.0                       NonHDL 75.53     Comment: NOTE:  Non-HDL goal should be 30 mg/dL higher than patient's LDL goal (i.e. LDL goal of < 70 mg/dL, would have non-HDL goal of < 100 mg/dL)  Comprehensive metabolic panel     Status: Abnormal   Collection Time: 02/08/20  8:01 AM  Result Value Ref Range   Sodium 135 135 - 145 mEq/L   Potassium 4.2 3.5 - 5.1 mEq/L   Chloride 99 96 - 112 mEq/L   CO2 27 19 - 32 mEq/L   Glucose, Bld 219 (H) 70 - 99  mg/dL   BUN 16 6 - 23 mg/dL   Creatinine, Ser 0.64 0.40 - 1.20 mg/dL   Total Bilirubin 0.5 0.2 - 1.2 mg/dL   Alkaline Phosphatase 47 39 - 117 U/L   AST 40 (H) 0 - 37 U/L   ALT 59 (H) 0 - 35 U/L   Total Protein 7.1 6.0 - 8.3 g/dL   Albumin 4.7 3.5 - 5.2 g/dL   GFR 91.99 >60.00 mL/min   Calcium 9.3 8.4 - 10.5 mg/dL  LDL cholesterol, direct  Status: None   Collection Time: 02/08/20  8:01 AM  Result Value Ref Range   Direct LDL 52.0 mg/dL    Comment: Optimal:  <100 mg/dLNear or Above Optimal:  100-129 mg/dLBorderline High:  130-159 mg/dLHigh:  160-189 mg/dLVery High:  >190 mg/dL   Objective  Body mass index is 25.87 kg/m. Wt Readings from Last 3 Encounters:  04/03/20 165 lb 3.2 oz (74.9 kg)  03/13/20 164 lb 14.4 oz (74.8 kg)  11/01/19 164 lb (74.4 kg)   Temp Readings from Last 3 Encounters:  04/03/20 98.4 F (36.9 C) (Oral)  03/13/20 97.6 F (36.4 C)  10/11/18 97.7 F (36.5 C) (Oral)   BP Readings from Last 3 Encounters:  04/03/20 (!) 152/88  03/13/20 (!) 150/96  11/01/19 132/79   Pulse Readings from Last 3 Encounters:  04/03/20 97  03/13/20 92  10/11/18 82    Physical Exam Vitals and nursing note reviewed.  Constitutional:      Appearance: Normal appearance. She is well-developed and well-groomed.  HENT:     Head: Normocephalic and atraumatic.  Eyes:     Conjunctiva/sclera: Conjunctivae normal.     Pupils: Pupils are equal, round, and reactive to light.  Cardiovascular:     Rate and Rhythm: Normal rate and regular rhythm.     Heart sounds: Normal heart sounds. No murmur heard.   Pulmonary:     Effort: Pulmonary effort is normal.     Breath sounds: Normal breath sounds.  Skin:    General: Skin is warm and dry.  Neurological:     General: No focal deficit present.     Mental Status: She is alert and oriented to person, place, and time. Mental status is at baseline.     Gait: Gait normal.  Psychiatric:        Attention and Perception: Attention and  perception normal.        Mood and Affect: Mood and affect normal.        Speech: Speech normal.        Behavior: Behavior normal. Behavior is cooperative.        Thought Content: Thought content normal.        Cognition and Memory: Cognition normal.        Judgment: Judgment normal.     Assessment  Plan  Hypertension associated with diabetes ([place]) - Plan: Semaglutide (RYBELSUS) 7 MG TABS, Semaglutide (RYBELSUS) 3 MG TABS qd 30 d supply samples given then will start 7 sent humana Add norvasc 2.5 to hyzaar 100-25   Type 2 diabetes mellitus without complication, without long-term current use of insulin ([facility]) - Plan: Semaglutide (RYBELSUS) 7 MG TABS, Semaglutide (RYBELSUS) 3 MG TABS  Benign paroxysmal positional vertigo due to bilateral vestibular disorder - Plan: meclizine (ANTIVERT) 12.5 MG tablet prn   Overweight (BMI 25.0-29.9)  rec healthy diet and exercise   HM Flu shot utd prevnar had 02/02/17 pna 23utd MMR immune  covid vaccine had 10/30/19 2nd dose 12/05/19   Rx shingrixin pastmailed Rx as well as twinrix 3/3/ doses mailed 08/01/19 unclear if had as of 11/01/19 Tdap2/4/17  hepAwith upcominglabs  Hep C negative 12/17/16  RequestedRecords labs, vaccines,pap 2015 and colonoscopy lastPCP cant find report but reports due in 02/2020  PCPper pt had 05/2014 normal mom +colon cancer due 05/2019 DEXA 03/16/17 normal scanned in chart   Mammogram8/5/20 negative, referred   Colonoscopy pt wants to do early in 2021disc 11/01/19 and wants to still wait no alarm sx's but she does have  FH mom + colon cancer  -pt wants to wait fall 2021 as of 04/03/20   Out of age window pap no records from prior PCP  Est. Dermatology Dr. Kellie Moor h/o [facility] resch 12/2019  6/24/20Pharmacy [facility] [address] and humanamail order  Eye surgery tbd ? scheduled 11/08/19 pt moved back later date   Former [employer] NP [place] [phone] disccolonoscopyprev  report  H/o Dr. Janese Banks Provider: Dr. Olivia Mackie McLean-Scocuzza-Internal Medicine

## 2020-04-21 ENCOUNTER — Ambulatory Visit (INDEPENDENT_AMBULATORY_CARE_PROVIDER_SITE_OTHER): Payer: Medicare Other

## 2020-04-21 VITALS — Ht 67.0 in | Wt 165.0 lb

## 2020-04-21 DIAGNOSIS — Z0000 Encounter for general adult medical examination without abnormal findings: Secondary | ICD-10-CM

## 2020-04-21 NOTE — Progress Notes (Signed)
Subjective:   Christy Stout is a 69 y.o. female who presents for an Initial Medicare Annual Wellness Visit.  Review of Systems    No ROS.  Medicare Wellness Virtual Visit.  Cardiac Risk Factors include: advanced age (>46mn, >>54women);diabetes mellitus;hypertension     Objective:    Today's Vitals   04/21/20 1233  Weight: 165 lb (74.8 kg)  Height: '5\' 7"'$  (1.702 m)   Body mass index is 25.84 kg/m.  Advanced Directives 04/21/2020 03/13/2020  Does Patient Have a Medical Advance Directive? Yes Yes  Type of AParamedicof ASte. MarieLiving will Living will  Does patient want to make changes to medical advance directive? No - Patient declined -  Copy of HOrdwayin Chart? No - copy requested -    Current Medications (verified) Outpatient Encounter Medications as of 04/21/2020  Medication Sig  . Accu-Chek Softclix Lancets lancets Used to check blood sugars once per day. Softclix lancets or ask pt which she prefers  . amLODipine (NORVASC) 2.5 MG tablet Take 1 tablet (2.5 mg total) by mouth daily.  .Marland Kitchenaspirin EC 81 MG tablet Take 81 mg by mouth daily.  . blood glucose meter kit and supplies KIT Dispense based on patient and insurance preference. Use up to four times daily as directed. (FOR ICD-9 250.00, 250.01).  . blood glucose meter kit and supplies Dispense based on patient and insurance preference. Bid  directed. (FOR ICD-10 E10.9, E11.9). 1 year supply strips and lancets  . Blood Glucose Monitoring Suppl (ACCU-CHEK GUIDE) w/Device KIT 1 Device by Does not apply route daily.  .Marland KitchenglipiZIDE (GLUCOTROL XL) 10 MG 24 hr tablet Take 1 tablet (10 mg total) by mouth daily with breakfast.  . glucose blood (ACCU-CHEK GUIDE) test strip Used to check blood sugar once per day E11.9.  . losartan-hydrochlorothiazide (HYZAAR) 100-25 MG tablet Take 1 tablet by mouth daily. In am  . Magnesium 250 MG TABS Take by mouth daily.  . meclizine (ANTIVERT) 12.5 MG  tablet Take 1 tablet (12.5 mg total) by mouth 2 (two) times daily as needed for dizziness.  . meloxicam (MOBIC) 15 MG tablet Take 1 tablet (15 mg total) by mouth daily as needed for pain.  . metFORMIN (GLUCOPHAGE-XR) 500 MG 24 hr tablet Take 1,000 mg by mouth 2 (two) times daily with a meal.  . Multiple Vitamin (MULTI-VITAMIN DAILY PO) Take by mouth.  . Omega-3 Fatty Acids (FISH OIL PO) Take by mouth.  . rosuvastatin (CRESTOR) 10 MG tablet Take 1 tablet (10 mg total) by mouth at bedtime.  . Semaglutide (RYBELSUS) 3 MG TABS Take 1 tablet by mouth daily. Samples of this drug were given to the patient, quantity 30 day, Lot Number [id number]  . Semaglutide (RYBELSUS) 7 MG TABS Take 1 tablet by mouth daily. In 1 month  . Vitamin D, Cholecalciferol, 1000 units CAPS Take 2,000 Int'l Units/day by mouth.   . zolpidem (AMBIEN) 5 MG tablet Take 1 tablet (5 mg total) by mouth at bedtime as needed for sleep.   No facility-administered encounter medications on file as of 04/21/2020.    Allergies (verified) Lisinopril   History: Past Medical History:  Diagnosis Date  . Arthritis   . Breast cancer, right ([place] 2000    lumpectomy 8 rounds chemo, 6 weeks radiation mammo 02/24/17 negative on Arimedex x 7 years   . Diabetes mellitus without complication ([place]   . History of chicken pox   . Hyperlipidemia   .  Hypertension   . Personal history of chemotherapy 2000   Rt. Breast  . Personal history of radiation therapy 2000   Rt. breast  . UTI (urinary tract infection)    Past Surgical History:  Procedure Laterality Date  . BREAST EXCISIONAL BIOPSY Right 2000   +  . BREAST SURGERY     2000  . WRIST SURGERY     Family History  Problem Relation Age of Onset  . Cancer Mother        colon age 25  . Heart disease Father   . Stroke Father   . Arthritis Brother   . Diabetes Brother   . Cancer Maternal Grandmother        breast   . Breast cancer Maternal Grandmother   . Heart disease Maternal  Grandfather   . Arthritis Brother   . Alcohol abuse Son   . Hypertension Son    Social History   Socioeconomic History  . Marital status: Married    Spouse name: Not on file  . Number of children: Not on file  . Years of education: Not on file  . Highest education level: Not on file  Occupational History  . Not on file  Tobacco Use  . Smoking status: Never Smoker  . Smokeless tobacco: Never Used  Substance and Sexual Activity  . Alcohol use: Yes  . Drug use: Not Currently  . Sexual activity: Yes    Partners: Male  Other Topics Concern  . 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 Difficulty of Paying Living Expenses:   Food Insecurity:   . Worried About Programme researcher, broadcasting/film/video in the Last Year:   . Barista in the Last Year:   Transportation Needs:   . Freight forwarder (Medical):   Marland Kitchen Lack of Transportation (Non-Medical):   Physical Activity:   . Days of Exercise per Week:   . Minutes of Exercise per Session:   Stress:   . Feeling of Stress :   Social Connections:   . Frequency of Communication with Friends and Family:   . Frequency of Social Gatherings with Friends and Family:   . Attends Religious Services:   . Active Member of Clubs or Organizations:   . Attends Banker Meetings:   Marland Kitchen Marital Status:     Tobacco Counseling Counseling given: Not Answered   Clinical Intake:           Diabetes: Yes (Followed by pcp)  How often do you need to have someone help you when you read instructions, pamphlets, or other written materials from your doctor or pharmacy?: 1 - Never  Interpreter Needed?: No       Activities of Daily Living In your present state of health, do you have any difficulty performing the following activities: 04/21/2020  Hearing? N  Vision? N  Difficulty concentrating or making decisions? N  Walking or climbing stairs? N  Dressing or bathing? N  Doing errands, shopping? N  Preparing Food and eating ? N  Using  the Toilet? N  In the past six months, have you accidently leaked urine? N  Do you have problems with loss of bowel control? N  Managing your Medications? N  Managing your Finances? N  Housekeeping or managing your Housekeeping? N  Some recent data might be hidden    Patient Care Team: McLean-Scocuzza, Nino Glow, MD as PCP - General (Internal Medicine) De Hollingshead, [facility] as Pharmacist (Pharmacist)  Indicate any recent Medical Services you may have received from other than [facility] providers in the past year (date may be approximate).     Assessment:   This is a routine wellness examination for Merkel.  I connected with Arnetha today by telephone and verified that I am speaking with the correct person using two identifiers. Location patient: home Location provider: work Persons participating in the virtual visit: patient, Marine scientist.    I discussed the limitations, risks, security and privacy concerns of performing an evaluation and management service by telephone and the availability of in person appointments. The patient expressed understanding and verbally consented to this telephonic visit.    Interactive audio and video telecommunications were attempted between this provider and patient, however failed, due to patient having technical difficulties OR patient did not have access to video capability.  We continued and completed visit with audio only.  Some vital signs may be absent or patient reported.   Hearing/Vision screen  Hearing Screening   '125Hz'$  '250Hz'$  '500Hz'$  '1000Hz'$  '2000Hz'$  '3000Hz'$  '4000Hz'$  '6000Hz'$  '8000Hz'$   Right ear:           Left ear:            Comments: Patient is able to hear conversational tones without difficulty.  No issues reported.  Vision Screening Comments: Followed by [facility] Wears reader lenses Visual acuity not assessed, virtual visit.  They have seen their ophthalmologist in the last 12 months.    Dietary issues and exercise activities discussed: Current Exercise Habits: Home exercise routine, Type of exercise: walking, Time (Minutes): 30, Intensity: Mild  Goals      Patient Stated   .  PharmD "I can't afford my Januvia" (pt-stated)      Current Barriers:  . Diabetes: uncontrolled but improved; most recent A1c 7.3%  . Current antihyperglycemic regimen: metformin 1000 mg BID, glipizide XL 5 mg QAM; prescribed Januvia 100 mg daily but has been unable to afford since she switched to her new [employer] prescription drug plan (notes ~$300 copay) . Denies hypoglycemic symptoms, including dizziness, lightheadedness, shaking, sweating . Denies hyperglycemic symptoms, including polyuria, polydipsia, polyphagia, nocturia, blurred vision, neuropathy . Current blood glucose readings: Reports fastings 150-170 . Cardiovascular risk reduction: o Current hypertensive regimen: losartan/HCTZ 100/25 mg daily o Current hyperlipidemia regimen: rosuvastatin 10 mg daily  Pharmacist Clinical Goal(s):  Marland Kitchen Over the next 90 days, patient will work with PharmD and primary care provider to address optimized medication management  Interventions: . Comprehensive medication review performed, medication list updated in electronic medical record . Discussed patient assistance program for DPP4 agents, such as Januvia or Tradjenta. Unfortunately, she is over income in 2020 d/t her husband having received retirement bonuses. She is unsure if she is going to qualify in 2021; will review planned social security income and other sources with her husband to determine if she will qualify.  . Will outreach [employer] drug representative to determine  if we could obtain Januvia samples in the meantime  Patient Self Care Activities:  . Patient will check blood glucose BID , document,  and provide at future appointments . Patient will take medications as prescribed . Patient will report any questions or concerns to provider   Initial goal documentation       Depression Screen PHQ 2/9 Scores 04/21/2020 04/03/2020 07/31/2019 03/28/2019 06/13/2018 03/16/2018  PHQ - 2 Score 0 0 0 0 0 0  PHQ- 9 Score - - - 0 - -    Fall Risk Fall Risk  04/21/2020 04/03/2020 07/31/2019 06/13/2018 03/16/2018  Falls in the past year? 0 0 0 No No  Number falls in past yr: 0 0 - - -  Injury with Fall? 0 0 - - -  Risk for fall due to : - No Fall Risks - - -  Follow up Falls evaluation completed Falls evaluation completed - - -   Handrails in use when climbing stairs? Yes  Home free of loose throw rugs in walkways, pet beds, electrical cords, etc? Yes  Adequate lighting in your home to reduce risk of falls? Yes   ASSISTIVE DEVICES UTILIZED TO PREVENT FALLS: Use of a cane, walker or w/c? No  Grab bars in the bathroom? No  Shower chair or bench in shower? Yes  Elevated toilet seat or a handicapped toilet? Yes   TIMED UP AND GO:  Was the test performed? No . Virtual visit.   Cognitive Function:  Patient is alert and oriented x3   6CIT Screen 04/21/2020  What Year? 0 points  What month? 0 points  What time? 0 points  Count back from 20 0 points  Months in reverse 0 points  Repeat phrase 0 points  Total Score 0   Immunizations Immunization History  Administered Date(s) Administered  . Fluad Quad(high Dose 65+) 06/15/2019  . Influenza, High Dose Seasonal PF 06/13/2018  . Moderna SARS-COVID-2 Vaccination 11/03/2019, 12/03/2019  . Pneumococcal Polysaccharide-23 03/16/2018   Health Maintenance Health Maintenance  Topic Date Due  . FOOT EXAM  Never done  . INFLUENZA VACCINE  05/04/2020  . HEMOGLOBIN A1C  08/10/2020  . OPHTHALMOLOGY EXAM  08/13/2020  .  MAMMOGRAM  05/08/2021  . COLONOSCOPY  05/04/2024  . TETANUS/TDAP  11/07/2025  . DEXA SCAN  Completed  . COVID-19 Vaccine  Completed  . Hepatitis C Screening  Completed  . PNA vac Low Risk Adult  Completed   Foot exam- no wounds  Dental Screening: Recommended annual dental exams for proper oral hygiene  Community Resource Referral / Chronic Care Management: CRR required this visit?  No   CCM required this visit?  No     Plan:   Keep all routine maintenance appointments.   Follow up 08/06/20 @ 10:30  I have personally reviewed and noted the following in the patient's chart:   . Medical and social history . Use of alcohol, tobacco or illicit drugs  . Current medications and supplements . Functional ability and status . Nutritional status . Physical activity . Advanced directives . List of other physicians . Hospitalizations, surgeries, and ER visits in previous 12 months . Vitals . Screenings to include cognitive, depression, and falls . Referrals and appointments  In addition, I have reviewed and discussed with patient certain preventive protocols, quality metrics, and best practice recommendations. A written personalized care plan for preventive services as well as general preventive health recommendations were provided to patient via mychart.     Varney Biles, LPN   [phone]

## 2020-04-21 NOTE — Patient Instructions (Addendum)
Christy Stout , Thank you for taking time to come for your Medicare Wellness Visit. I appreciate your ongoing commitment to your health goals. Please review the following plan we discussed and let me know if I can assist you in the future.   These are the goals we discussed: Goals      Patient Stated   .  PharmD "I can't afford my Januvia" (pt-stated)      Current Barriers:  . Diabetes: uncontrolled but improved; most recent A1c 7.3%  . Current antihyperglycemic regimen: metformin 1000 mg BID, glipizide XL 5 mg QAM; prescribed Januvia 100 mg daily but has been unable to afford since she switched to her new [employer] prescription drug plan (notes ~$300 copay) . Denies hypoglycemic symptoms, including dizziness, lightheadedness, shaking, sweating . Denies hyperglycemic symptoms, including polyuria, polydipsia, polyphagia, nocturia, blurred vision, neuropathy . Current blood glucose readings: Reports fastings 150-170 . Cardiovascular risk reduction: o Current hypertensive regimen: losartan/HCTZ 100/25 mg daily o Current hyperlipidemia regimen: rosuvastatin 10 mg daily  Pharmacist Clinical Goal(s):  Marland Kitchen Over the next 90 days, patient will work with PharmD and primary care provider to address optimized medication management  Interventions: . Comprehensive medication review performed, medication list updated in electronic medical record . Discussed patient assistance program for DPP4 agents, such as Januvia or Tradjenta. Unfortunately, she is over income in 2020 d/t her husband having received retirement bonuses. She is unsure if she is going to qualify in 2021; will review planned social security income and other sources with her husband to determine if she will qualify.  . Will outreach [employer] drug representative to determine if we could obtain Januvia samples in the meantime  Patient Self Care Activities:  . Patient will check blood glucose BID , document, and provide at future  appointments . Patient will take medications as prescribed . Patient will report any questions or concerns to provider   Initial goal documentation        This is a list of the screening recommended for you and due dates:  Health Maintenance  Topic Date Due  . Complete foot exam   Never done  . Flu Shot  05/04/2020  . Hemoglobin A1C  08/10/2020  . Eye exam for diabetics  08/13/2020  . Mammogram  05/08/2021  . Colon Cancer Screening  05/04/2024  . Tetanus Vaccine  11/07/2025  . DEXA scan (bone density measurement)  Completed  . COVID-19 Vaccine  Completed  .  Hepatitis C: One time screening is recommended by Center for Disease Control  (CDC) for  adults born from [birth year] through 1965.   Completed  . Pneumonia vaccines  Completed   Advanced directives: End of life planning; Advance aging; Advanced directives discussed.  Copy of current HCPOA/Living Will requested.    Conditions/risks identified: none new  Follow up in one year for your annual wellness visit    Preventive Care 65 Years and Older, Female Preventive care refers to lifestyle choices and visits with your health care provider that can promote health and wellness. What does preventive care include?  A yearly physical exam. This is also called an annual well check.  Dental exams once or twice a year.  Routine eye exams. Ask your health care provider how often you should have your eyes checked.  Personal lifestyle choices, including:  Daily care of your teeth and gums.  Regular physical activity.  Eating a healthy diet.  Avoiding tobacco and drug use.  Limiting alcohol use.  Practicing safe sex.  Taking low-dose aspirin every day.  Taking vitamin and mineral supplements as recommended by your health care provider. What happens during an annual well check? The services and screenings done by your health care provider during your annual well check will depend on your age, overall health, lifestyle risk  factors, and family history of disease. Counseling  Your health care provider may ask you questions about your:  Alcohol use.  Tobacco use.  Drug use.  Emotional well-being.  Home and relationship well-being.  Sexual activity.  Eating habits.  History of falls.  Memory and ability to understand (cognition).  Work and work Statistician.  Reproductive health. Screening  You may have the following tests or measurements:  Height, weight, and BMI.  Blood pressure.  Lipid and cholesterol levels. These may be checked every 5 years, or more frequently if you are over 75 years old.  Skin check.  Lung cancer screening. You may have this screening every year starting at age 70 if you have a 30-pack-year history of smoking and currently smoke or have quit within the past 15 years.  Fecal occult blood test (FOBT) of the stool. You may have this test every year starting at age 74.  Flexible sigmoidoscopy or colonoscopy. You may have a sigmoidoscopy every 5 years or a colonoscopy every 10 years starting at age 42.  Hepatitis C blood test.  Hepatitis B blood test.  Sexually transmitted disease (STD) testing.  Diabetes screening. This is done by checking your blood sugar (glucose) after you have not eaten for a while (fasting). You may have this done every 1-3 years.  Bone density scan. This is done to screen for osteoporosis. You may have this done starting at age 98.  Mammogram. This may be done every 1-2 years. Talk to your health care provider about how often you should have regular mammograms. Talk with your health care provider about your test results, treatment options, and if necessary, the need for more tests. Vaccines  Your health care provider may recommend certain vaccines, such as:  Influenza vaccine. This is recommended every year.  Tetanus, diphtheria, and acellular pertussis (Tdap, Td) vaccine. You may need a Td booster every 10 years.  Zoster vaccine. You  may need this after age 103.  Pneumococcal 13-valent conjugate (PCV13) vaccine. One dose is recommended after age 48.  Pneumococcal polysaccharide (PPSV23) vaccine. One dose is recommended after age 32. Talk to your health care provider about which screenings and vaccines you need and how often you need them. This information is not intended to replace advice given to you by your health care provider. Make sure you discuss any questions you have with your health care provider. Document Released: 10/17/2015 Document Revised: 06/09/2016 Document Reviewed: 07/22/2015 Elsevier Interactive Patient Education  2017 [place] Prevention in the Home Falls can cause injuries. They can happen to people of all ages. There are many things you can do to make your home safe and to help prevent falls. What can I do on the outside of my home?  Regularly fix the edges of walkways and driveways and fix any cracks.  Remove anything that might make you trip as you walk through a door, such as a raised step or threshold.  Trim any bushes or trees on the path to your home.  Use bright outdoor lighting.  Clear any walking paths of anything that might make someone trip, such as rocks or tools.  Regularly check to see if handrails are loose or  broken. Make sure that both sides of any steps have handrails.  Any raised decks and porches should have guardrails on the edges.  Have any leaves, snow, or ice cleared regularly.  Use sand or salt on walking paths during winter.  Clean up any spills in your garage right away. This includes oil or grease spills. What can I do in the bathroom?  Use night lights.  Install grab bars by the toilet and in the tub and shower. Do not use towel bars as grab bars.  Use non-skid mats or decals in the tub or shower.  If you need to sit down in the shower, use a plastic, non-slip stool.  Keep the floor dry. Clean up any water that spills on the floor as soon as  it happens.  Remove soap buildup in the tub or shower regularly.  Attach bath mats securely with double-sided non-slip rug tape.  Do not have throw rugs and other things on the floor that can make you trip. What can I do in the bedroom?  Use night lights.  Make sure that you have a light by your bed that is easy to reach.  Do not use any sheets or blankets that are too big for your bed. They should not hang down onto the floor.  Have a firm chair that has side arms. You can use this for support while you get dressed.  Do not have throw rugs and other things on the floor that can make you trip. What can I do in the kitchen?  Clean up any spills right away.  Avoid walking on wet floors.  Keep items that you use a lot in easy-to-reach places.  If you need to reach something above you, use a strong step stool that has a grab bar.  Keep electrical cords out of the way.  Do not use floor polish or wax that makes floors slippery. If you must use wax, use non-skid floor wax.  Do not have throw rugs and other things on the floor that can make you trip. What can I do with my stairs?  Do not leave any items on the stairs.  Make sure that there are handrails on both sides of the stairs and use them. Fix handrails that are broken or loose. Make sure that handrails are as long as the stairways.  Check any carpeting to make sure that it is firmly attached to the stairs. Fix any carpet that is loose or worn.  Avoid having throw rugs at the top or bottom of the stairs. If you do have throw rugs, attach them to the floor with carpet tape.  Make sure that you have a light switch at the top of the stairs and the bottom of the stairs. If you do not have them, ask someone to add them for you. What else can I do to help prevent falls?  Wear shoes that:  Do not have high heels.  Have rubber bottoms.  Are comfortable and fit you well.  Are closed at the toe. Do not wear sandals.  If  you use a stepladder:  Make sure that it is fully opened. Do not climb a closed stepladder.  Make sure that both sides of the stepladder are locked into place.  Ask someone to hold it for you, if possible.  Clearly mark and make sure that you can see:  Any grab bars or handrails.  First and last steps.  Where the edge of each step  is.  Use tools that help you move around (mobility aids) if they are needed. These include:  Canes.  Walkers.  Scooters.  Crutches.  Turn on the lights when you go into a dark area. Replace any light bulbs as soon as they burn out.  Set up your furniture so you have a clear path. Avoid moving your furniture around.  If any of your floors are uneven, fix them.  If there are any pets around you, be aware of where they are.  Review your medicines with your doctor. Some medicines can make you feel dizzy. This can increase your chance of falling. Ask your doctor what other things that you can do to help prevent falls. This information is not intended to replace advice given to you by your health care provider. Make sure you discuss any questions you have with your health care provider. Document Released: 07/17/2009 Document Revised: 02/26/2016 Document Reviewed: 10/25/2014 Elsevier Interactive Patient Education  2017 [employer].

## 2020-04-24 ENCOUNTER — Ambulatory Visit
Admission: RE | Admit: 2020-04-24 | Discharge: 2020-04-24 | Disposition: A | Discharge status: Home / Self Care | Payer: Medicare Other | Source: Ambulatory Visit | Attending: Oncology | Admitting: Oncology

## 2020-04-24 ENCOUNTER — Other Ambulatory Visit: Payer: Self-pay

## 2020-04-24 DIAGNOSIS — Z853 Personal history of malignant neoplasm of breast: Secondary | ICD-10-CM

## 2020-04-24 DIAGNOSIS — R937 Abnormal findings on diagnostic imaging of other parts of musculoskeletal system: Secondary | ICD-10-CM

## 2020-04-24 DIAGNOSIS — M47814 Spondylosis without myelopathy or radiculopathy, thoracic region: Secondary | ICD-10-CM | POA: Diagnosis not present

## 2020-04-24 DIAGNOSIS — M5124 Other intervertebral disc displacement, thoracic region: Secondary | ICD-10-CM | POA: Diagnosis not present

## 2020-04-24 DIAGNOSIS — M5125 Other intervertebral disc displacement, thoracolumbar region: Secondary | ICD-10-CM | POA: Diagnosis not present

## 2020-04-24 DIAGNOSIS — C50919 Malignant neoplasm of unspecified site of unspecified female breast: Secondary | ICD-10-CM | POA: Diagnosis not present

## 2020-04-24 MED ORDER — GADOBUTROL 1 MMOL/ML IV SOLN
7.0000 mL | Freq: Once | INTRAVENOUS | Status: AC | PRN
  Administered 2020-04-24: 7 mL via INTRAVENOUS

## 2020-04-28 ENCOUNTER — Other Ambulatory Visit: Payer: Self-pay

## 2020-04-28 ENCOUNTER — Inpatient Hospital Stay: Payer: Medicare Other | Attending: Oncology | Admitting: Oncology

## 2020-04-28 DIAGNOSIS — R937 Abnormal findings on diagnostic imaging of other parts of musculoskeletal system: Secondary | ICD-10-CM | POA: Diagnosis not present

## 2020-04-28 NOTE — Progress Notes (Signed)
I connected with Christy Stout Parents on 04/28/20 at  3:00 PM EDT by video enabled telemedicine visit and verified that I am speaking with the correct person using two identifiers.   I discussed the limitations, risks, security and privacy concerns of performing an evaluation and management service by telemedicine and the availability of in-person appointments. I also discussed with the patient that there may be a patient responsible charge related to this service. The patient expressed understanding and agreed to proceed.  Other persons participating in the visit and their role in the encounter:  none  Patient's location:  home Provider's location:  work  Risk analyst Complaint: Discuss MRI spine results and further management  History of present illness: Patient is a 69 year old female with a past medical history significant for hypertension hyperlipidemia and diabetes who has been referred to [place] for abnormal spine imaging.  She underwent MRI of the thoracic spine with and without contrast as a follow-up of the CT that she had another 2020.  In November 2020 she was noted to have delineated sclerotic lesion in the T12 vertebral body MRI thoracic spine in March 2021 showed a 3 cm T12 vertebral body lesion which was favored to represent hemangioma as she was noted to have additional hemangiomas at T5 and T6 level.  Patient however has a personal history of breast cancer in 2000 that was treated outside of [place].  Displays ER/PR positive and likely HER-2 negative.  Patient underwent surgery radiation treatment as well as adjuvant chemotherapy and 5 years of hormone therapy and has been in remission since then.   Interval history: Patient reports doing well and denies any back pain.  Appetite and weight have remained stable.   Review of Systems  Constitutional: Negative for chills, fever, malaise/fatigue and weight loss.  HENT: Negative for congestion, ear discharge and nosebleeds.   Eyes:  Negative for blurred vision.  Respiratory: Negative for cough, hemoptysis, sputum production, shortness of breath and wheezing.   Cardiovascular: Negative for chest pain, palpitations, orthopnea and claudication.  Gastrointestinal: Negative for abdominal pain, blood in stool, constipation, diarrhea, heartburn, melena, nausea and vomiting.  Genitourinary: Negative for dysuria, flank pain, frequency, hematuria and urgency.  Musculoskeletal: Negative for back pain, joint pain and myalgias.  Skin: Negative for rash.  Neurological: Negative for dizziness, tingling, focal weakness, seizures, weakness and headaches.  Endo/Heme/Allergies: Does not bruise/bleed easily.  Psychiatric/Behavioral: Negative for depression and suicidal ideas. The patient does not have insomnia.     Allergies  Allergen Reactions  . Lisinopril     Cough     Past Medical History:  Diagnosis Date  . Arthritis   . Breast cancer, right ([place]) 2000    lumpectomy 8 rounds chemo, 6 weeks radiation mammo 02/24/17 negative on Arimedex x 7 years   . Diabetes mellitus without complication ([place])   . History of chicken pox   . Hyperlipidemia   . Hypertension   . Personal history of chemotherapy 2000   Rt. Breast  . Personal history of radiation therapy 2000   Rt. breast  . UTI (urinary tract infection)     Past Surgical History:  Procedure Laterality Date  . BREAST EXCISIONAL BIOPSY Right 2000   +  . BREAST SURGERY     2000  . WRIST SURGERY      Social History   Socioeconomic History  . Marital status: Married    Spouse name: Not on file  . Number of children: Not on file  . Years of education:  Not on file  . Highest education level: Not on file  Occupational History  . Not on file  Tobacco Use  . Smoking status: Never Smoker  . Smokeless tobacco: Never Used  Substance and Sexual Activity  . Alcohol use: Yes  . Drug use: Not Currently  . Sexual activity: Yes    Partners: Male  Other Topics Concern  .  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 Difficulty of Paying Living Expenses:   Food Insecurity:   . Worried About Charity fundraiser in the Last Year:   . Arboriculturist in the Last Year:   Transportation Needs:   . Film/video editor (Medical):   Marland Kitchen Lack of Transportation (Non-Medical):   Physical Activity:   . Days of Exercise per Week:   . Minutes of Exercise per Session:   Stress:   . Feeling of Stress :   Social Connections:   . Frequency of Communication with Friends and Family:   . Frequency of Social Gatherings with Friends and Family:   . Attends Religious Services:   . Active Member of Clubs or Organizations:   . Attends Archivist Meetings:   Marland Kitchen Marital Status:   Intimate Partner Violence:   . Fear of Current or Ex-Partner:   . Emotionally Abused:   Marland Kitchen Physically Abused:   . Sexually Abused:     Family History  Problem Relation Age of Onset  . Cancer Mother        colon age 81  . Heart disease Father   . Stroke Father   . Arthritis Brother   . Diabetes Brother   . Cancer Maternal Grandmother        breast   . Breast cancer Maternal Grandmother   . Heart disease Maternal Grandfather   . Arthritis Brother   . Alcohol abuse Son   . Hypertension Son      Current Outpatient Medications:  .  Accu-Chek Softclix Lancets lancets, Used to check blood sugars once per day. Softclix lancets or ask pt which she prefers, Disp: 100 each, Rfl: 4 .  amLODipine (NORVASC) 2.5 MG tablet, Take 1 tablet (2.5 mg total) by mouth daily., Disp: 90 tablet, Rfl: 0 .  aspirin EC 81 MG tablet, Take 81 mg by mouth daily., Disp: , Rfl:  .   blood glucose meter kit and supplies KIT, Dispense based on patient and insurance preference. Use up to four times daily as directed. (FOR ICD-9 250.00, 250.01)., Disp: 1 each, Rfl: 0 .  blood glucose meter kit and supplies, Dispense based on patient and insurance preference. Bid  directed. (FOR ICD-10 E10.9, E11.9). 1 year supply strips and lancets, Disp: 1 each, Rfl: 0 .  Blood Glucose Monitoring Suppl (ACCU-CHEK GUIDE) w/Device KIT, 1 Device by Does not apply route daily., Disp: 1 kit, Rfl: 0 .  glipiZIDE (GLUCOTROL XL) 10 MG 24 hr tablet, Take 1 tablet (10 mg total) by mouth daily with breakfast., Disp: 90 tablet, Rfl: 3 .  glucose blood (ACCU-CHEK GUIDE)  test strip, Used to check blood sugar once per day E11.9., Disp: 100 each, Rfl: 5 .  losartan-hydrochlorothiazide (HYZAAR) 100-25 MG tablet, Take 1 tablet by mouth daily. In am, Disp: 90 tablet, Rfl: 3 .  Magnesium 250 MG TABS, Take by mouth daily., Disp: , Rfl:  .  meloxicam (MOBIC) 15 MG tablet, Take 1 tablet (15 mg total) by mouth daily as needed for pain., Disp: 90 tablet, Rfl: 3 .  metFORMIN (GLUCOPHAGE-XR) 500 MG 24 hr tablet, Take 1,000 mg by mouth 2 (two) times daily with a meal., Disp: , Rfl:  .  Multiple Vitamin (MULTI-VITAMIN DAILY PO), Take by mouth., Disp: , Rfl:  .  Omega-3 Fatty Acids (FISH OIL PO), Take by mouth., Disp: , Rfl:  .  rosuvastatin (CRESTOR) 10 MG tablet, Take 1 tablet (10 mg total) by mouth at bedtime., Disp: 90 tablet, Rfl: 3 .  Semaglutide (RYBELSUS) 3 MG TABS, Take 1 tablet by mouth daily. Samples of this drug were given to the patient, quantity 30 day, Lot Number [id number] 12/2021, Disp: 30 tablet, Rfl: 0 .  Vitamin D, Cholecalciferol, 1000 units CAPS, Take 2,000 Int'l Units/day by mouth. , Disp: , Rfl:  .  zolpidem (AMBIEN) 5 MG tablet, Take 1 tablet (5 mg total) by mouth at bedtime as needed for sleep., Disp: 90 tablet, Rfl: 1 .  meclizine (ANTIVERT) 12.5 MG tablet, Take 1 tablet (12.5 mg total) by mouth 2 (two)  times daily as needed for dizziness. (Patient not taking: Reported on 04/28/2020), Disp: 60 tablet, Rfl: 0 .  Semaglutide (RYBELSUS) 7 MG TABS, Take 1 tablet by mouth daily. In 1 month (Patient not taking: Reported on 04/28/2020), Disp: 90 tablet, Rfl: 3  MR Thoracic Spine W Wo Contrast  Result Date: 04/25/2020 CLINICAL DATA:  Abnormal MRI of the spine. Breast cancer. Abnormal bone scan. Evaluate for metastatic disease. EXAM: MRI THORACIC WITHOUT AND WITH CONTRAST TECHNIQUE: Multiplanar and multiecho pulse sequences of the thoracic spine were obtained without and with intravenous contrast. CONTRAST:  63m GADAVIST GADOBUTROL 1 MMOL/ML IV SOLN COMPARISON:  MRI of the thoracic spine December 28, 2019; CT of the abdomen September 03, 2019 FINDINGS: Alignment:  Physiologic. Vertebrae: No fracture or evidence of discitis. No interval change of the T5 and T6 hemangioma. Again seen is lesion involving the posterior aspect of the T12 vertebral body extending into the corresponding pedicle with signal predominantly hyperintense on T1 and T2 with enhancement on the postcontrast sequence. No significant change is seen when compared to prior MRI. This lesion is also appreciated on prior CT of the abdomen performed on September 03, 2019 where mild expansion and coarse trabeculated without cortical disruption is seen, suggesting an atypical hemangioma. Cord:  Normal signal and morphology. Paraspinal and other soft tissues: Negative. Disc levels: T1-2: Mild facet degenerative changes. No significant spinal canal or neural foraminal stenosis. Perineural cysts are seen bilaterally. T2-3: No spinal canal or neural foraminal stenosis. T3-4: Tiny posterior disc protrusion and facet degenerative changes without significant spinal canal or neural foraminal stenosis. T4-5: Tiny posterior disc protrusion and facet degenerative change. No spinal canal or neural foraminal stenosis. T5-6: Facet degenerative changes. No spinal canal or neural  foraminal stenosis. T6-7: No spinal canal or neural foraminal stenosis. T7-8: Tiny posterior disc protrusion and mild facet degenerative changes. No spinal canal or neural foraminal stenosis. T8-9: Mild facet degenerative changes. No spinal canal or neural foraminal stenosis. T9-10: No spinal canal or neural foraminal stenosis. T10-11: Posterior disc protrusion causing indentation on  the thecal sac without significant spinal canal or neural foraminal stenosis. T11-12: Facet degenerative changes. No spinal canal or neural foraminal stenosis. T12-L1: Tiny posterior disc protrusion. No spinal canal or neural foraminal stenosis. IMPRESSION: 1. No significant change in lesion involving the posterior aspect of the T12 vertebral body extending into the corresponding pedicle. This lesion is also seen on prior CT of the abdomen performed on September 03, 2019 where mild expansion and coarse trabeculated without cortical disruption is seen, suggesting an atypical hemangioma. 2. Mild degenerative changes of the thoracic spine without significant spinal canal or neural foraminal stenosis. Electronically Signed   By: Pedro Earls M.D.   On: 04/25/2020 17:06    No images are attached to the encounter.   CMP Latest Ref Rng & Units 02/08/2020  Glucose 70 - 99 mg/dL 219(H)  BUN 6 - 23 mg/dL 16  Creatinine 0.40 - 1.20 mg/dL 0.64  Sodium 135 - 145 mEq/L 135  Potassium 3.5 - 5.1 mEq/L 4.2  Chloride 96 - 112 mEq/L 99  CO2 19 - 32 mEq/L 27  Calcium 8.4 - 10.5 mg/dL 9.3  Total Protein 6.0 - 8.3 g/dL 7.1  Total Bilirubin 0.2 - 1.2 mg/dL 0.5  Alkaline Phos 39 - 117 U/L 47  AST 0 - 37 U/L 40(H)  ALT 0 - 35 U/L 59(H)   CBC Latest Ref Rng & Units 02/08/2020  WBC 4.0 - 10.5 K/uL 11.2(H)  Hemoglobin 12.0 - 15.0 g/dL 12.9  Hematocrit 36 - 46 % 37.8  Platelets 150 - 400 K/uL 247.0     Observation/objective: Appears in no acute distress over video visit today.  Breathing is nonlabored  Assessment and plan:  Patient is a 69 year old female with remote history of breast cancer referred for abnormal thoracic spine imaging.  I have reviewed MRI thoracic spine images independently and discussed findings with the patient  Patient noted to have a sclerotic lesion in the T12 vertebral body that was incidentally found on CT abdomen in November 2020.  Subsequent MRI showed possible hemangioma in that region.  We did repeat her MRI at this time on 04/24/2020 which again shows No significant change in the lesion of the T12 vertebral body suggestive of an atypical hemangioma.  I also got in touch with interventional radiology Dr. Willeen Cass to see if any further imaging or intervention is required.  Given that the lesion has remained stable I agree that patient does not require any further MRI imaging in the future.   She does not require any follow-up with me at this time   Follow-up instructions: No follow-up needed  I discussed the assessment and treatment plan with the patient. The patient was provided an opportunity to ask questions and all were answered. The patient agreed with the plan and demonstrated an understanding of the instructions.   The patient was advised to call back or seek an in-person evaluation if the symptoms worsen or if the condition fails to improve as anticipated.   Visit Diagnosis: 1. Abnormal MRI, spine     Dr. Randa Evens, MD, MPH [facility] at [facility] Tel- [phone] 04/28/2020 3:21 PM

## 2020-05-05 ENCOUNTER — Encounter: Payer: Self-pay | Admitting: Internal Medicine

## 2020-05-05 MED ORDER — AMLODIPINE BESYLATE 2.5 MG PO TABS: 2.5000 mg | ORAL_TABLET | Freq: Every day | ORAL | 0 refills | Status: DC

## 2020-05-09 ENCOUNTER — Ambulatory Visit
Admission: RE | Admit: 2020-05-09 | Discharge: 2020-05-09 | Disposition: A | Discharge status: Home / Self Care | Payer: Medicare Other | Source: Ambulatory Visit | Attending: Internal Medicine | Admitting: Internal Medicine

## 2020-05-09 ENCOUNTER — Other Ambulatory Visit: Payer: Self-pay

## 2020-05-09 DIAGNOSIS — Z1231 Encounter for screening mammogram for malignant neoplasm of breast: Secondary | ICD-10-CM | POA: Diagnosis not present

## 2020-06-23 ENCOUNTER — Other Ambulatory Visit: Payer: Self-pay

## 2020-06-23 ENCOUNTER — Ambulatory Visit (INDEPENDENT_AMBULATORY_CARE_PROVIDER_SITE_OTHER): Payer: Medicare Other

## 2020-06-23 DIAGNOSIS — Z23 Encounter for immunization: Secondary | ICD-10-CM | POA: Diagnosis not present

## 2020-06-26 ENCOUNTER — Other Ambulatory Visit: Payer: Self-pay | Admitting: Internal Medicine

## 2020-06-26 DIAGNOSIS — I10 Essential (primary) hypertension: Secondary | ICD-10-CM

## 2020-06-26 DIAGNOSIS — E1159 Type 2 diabetes mellitus with other circulatory complications: Secondary | ICD-10-CM

## 2020-06-26 DIAGNOSIS — I152 Hypertension secondary to endocrine disorders: Secondary | ICD-10-CM

## 2020-06-26 MED ORDER — AMLODIPINE BESYLATE 2.5 MG PO TABS: 2.5000 mg | ORAL_TABLET | Freq: Every day | ORAL | 3 refills | Status: DC

## 2020-06-26 MED ORDER — LOSARTAN POTASSIUM-HCTZ 100-25 MG PO TABS: 1.0000 | ORAL_TABLET | Freq: Every day | ORAL | 3 refills | Status: DC

## 2020-06-26 MED ORDER — METFORMIN HCL ER 500 MG PO TB24: 1000.0000 mg | ORAL_TABLET | Freq: Two times a day (BID) | ORAL | 3 refills | Status: DC

## 2020-07-16 ENCOUNTER — Other Ambulatory Visit: Payer: Self-pay

## 2020-07-16 DIAGNOSIS — E1165 Type 2 diabetes mellitus with hyperglycemia: Secondary | ICD-10-CM

## 2020-07-16 MED ORDER — ROSUVASTATIN CALCIUM 10 MG PO TABS: 10.0000 mg | ORAL_TABLET | Freq: Every day | ORAL | 3 refills | Status: DC

## 2020-07-17 ENCOUNTER — Other Ambulatory Visit: Payer: Self-pay

## 2020-07-17 DIAGNOSIS — E1165 Type 2 diabetes mellitus with hyperglycemia: Secondary | ICD-10-CM

## 2020-07-17 MED ORDER — ROSUVASTATIN CALCIUM 10 MG PO TABS: 10.0000 mg | ORAL_TABLET | Freq: Every day | ORAL | 3 refills | Status: DC

## 2020-07-25 DIAGNOSIS — Z23 Encounter for immunization: Secondary | ICD-10-CM | POA: Diagnosis not present

## 2020-08-06 ENCOUNTER — Ambulatory Visit (INDEPENDENT_AMBULATORY_CARE_PROVIDER_SITE_OTHER): Payer: Medicare Other | Admitting: Internal Medicine

## 2020-08-06 ENCOUNTER — Other Ambulatory Visit: Payer: Self-pay

## 2020-08-06 ENCOUNTER — Telehealth: Payer: Self-pay | Admitting: *Deleted

## 2020-08-06 ENCOUNTER — Encounter: Payer: Self-pay | Admitting: Internal Medicine

## 2020-08-06 VITALS — BP 140/82 | HR 93 | Temp 98.2°F | Ht 67.0 in | Wt 163.2 lb

## 2020-08-06 DIAGNOSIS — Z800 Family history of malignant neoplasm of digestive organs: Secondary | ICD-10-CM | POA: Diagnosis not present

## 2020-08-06 DIAGNOSIS — E1165 Type 2 diabetes mellitus with hyperglycemia: Secondary | ICD-10-CM

## 2020-08-06 DIAGNOSIS — Z1211 Encounter for screening for malignant neoplasm of colon: Secondary | ICD-10-CM

## 2020-08-06 DIAGNOSIS — E119 Type 2 diabetes mellitus without complications: Secondary | ICD-10-CM | POA: Diagnosis not present

## 2020-08-06 DIAGNOSIS — I152 Hypertension secondary to endocrine disorders: Secondary | ICD-10-CM

## 2020-08-06 DIAGNOSIS — G4700 Insomnia, unspecified: Secondary | ICD-10-CM

## 2020-08-06 DIAGNOSIS — E1159 Type 2 diabetes mellitus with other circulatory complications: Secondary | ICD-10-CM

## 2020-08-06 MED ORDER — ROSUVASTATIN CALCIUM 10 MG PO TABS: 10.0000 mg | ORAL_TABLET | Freq: Every day | ORAL | 3 refills | Status: DC

## 2020-08-06 MED ORDER — ZOLPIDEM TARTRATE 5 MG PO TABS: 5.0000 mg | ORAL_TABLET | Freq: Every evening | ORAL | 1 refills | Status: DC | PRN

## 2020-08-06 MED ORDER — GLIPIZIDE ER 10 MG PO TB24: 10.0000 mg | ORAL_TABLET | Freq: Every day | ORAL | 3 refills | Status: DC

## 2020-08-06 NOTE — Progress Notes (Signed)
Chief Complaint  Patient presents with  . Follow-up   F/u  1. DM 2 with HTN on Rybelsus 7 mg qd, metformin xr 1000 mg bid, glipizide 10 mg xl, norvasc 2.5 mg qd, hyzaar 100-25 mg qd, crestor 10 mg qhs  BP elevated today but elevated and MD appts she shes and normal at home and had coffee this am normally at home 125/75-80 and in the am at times 155/160 but later in the day sbp 135 per pt  Foot exam today   2.insomnia needs refill or ambien 5 mg qhs prn   Review of Systems  Constitutional: Negative for weight loss.  HENT: Negative for hearing loss.   Respiratory: Negative for shortness of breath.   Cardiovascular: Negative for chest pain.  Gastrointestinal: Negative for abdominal pain.  Musculoskeletal: Negative for falls and joint pain.  Skin: Negative for rash.  Neurological: Negative for dizziness.  Psychiatric/Behavioral: Negative for depression and memory loss. The patient has insomnia.    Past Medical History:  Diagnosis Date  . Arthritis   . Breast cancer, right ([place]) 2000    lumpectomy 8 rounds chemo, 6 weeks radiation mammo 02/24/17 negative on Arimedex x 7 years   . Diabetes mellitus without complication ([place])   . History of chicken pox   . Hyperlipidemia   . Hypertension   . Personal history of chemotherapy 2000   Rt. Breast  . Personal history of radiation therapy 2000   Rt. breast  . UTI (urinary tract infection)    Past Surgical History:  Procedure Laterality Date  . BREAST EXCISIONAL BIOPSY Right 2000   +  . BREAST SURGERY     2000  . WRIST SURGERY     Family History  Problem Relation Age of Onset  . Cancer Mother        colon age 75  . Heart disease Father   . Stroke Father   . Arthritis Brother   . Diabetes Brother   . Cancer Maternal Grandmother        breast   . Breast cancer Maternal Grandmother   . Heart disease Maternal Grandfather   . Arthritis Brother   . Alcohol abuse Son   . Hypertension Son    Social History   Socioeconomic  History  . Marital status: Married    Spouse name: Not on file  . Number of children: Not on file  . Years of education: Not on file  . Highest education level: Not on file  Occupational History  . Not on file  Tobacco Use  . Smoking status: Never Smoker  . Smokeless tobacco: Never Used  Substance and Sexual Activity  . Alcohol use: Yes  . Drug use: Not Currently  . Sexual activity: Yes    Partners: Male  Other Topics Concern  . 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 Difficulty of Paying Living Expenses: Not on file  Food Insecurity:   . Worried About Charity fundraiser in the Last Year: Not on file  . Ran Out of  Food in the Last Year: Not on file  Transportation Needs:   . Lack of Transportation (Medical): Not on file  . Lack of Transportation (Non-Medical): Not on file  Physical Activity:   . Days of Exercise per Week: Not on file  . Minutes of Exercise per Session: Not on file  Stress:   . Feeling of Stress : Not on file  Social Connections:   . Frequency of Communication with Friends and Family: Not on file  . Frequency of Social Gatherings with Friends and Family: Not on file  . Attends Religious Services: Not on file  . Active Member of Clubs or Organizations: Not on file  . Attends Archivist Meetings: Not on file  . Marital Status: Not on file  Intimate Partner Violence:   . Fear of Current or Ex-Partner: Not on file  . Emotionally Abused: Not on file  . Physically Abused: Not on file  . Sexually Abused: Not on file   Current Meds  Medication Sig  . Accu-Chek Softclix Lancets lancets Used to check blood sugars once  per day. Softclix lancets or ask pt which she prefers  . amLODipine (NORVASC) 2.5 MG tablet Take 1 tablet (2.5 mg total) by mouth daily.  Marland Kitchen aspirin EC 81 MG tablet Take 81 mg by mouth daily.  . blood glucose meter kit and supplies KIT Dispense based on patient and insurance preference. Use up to four times daily as directed. (FOR ICD-9 250.00, 250.01).  . blood glucose meter kit and supplies Dispense based on patient and insurance preference. Bid  directed. (FOR ICD-10 E10.9, E11.9). 1 year supply strips and lancets  . Blood Glucose Monitoring Suppl (ACCU-CHEK GUIDE) w/Device KIT 1 Device by Does not apply route daily.  Marland Kitchen glipiZIDE (GLUCOTROL XL) 10 MG 24 hr tablet Take 1 tablet (10 mg total) by mouth daily with breakfast.  . glucose blood (ACCU-CHEK GUIDE) test strip Used to check blood sugar once per day E11.9.  . losartan-hydrochlorothiazide (HYZAAR) 100-25 MG tablet Take 1 tablet by mouth daily. In am  . Magnesium 250 MG TABS Take by mouth daily.  . meclizine (ANTIVERT) 12.5 MG tablet Take 1 tablet (12.5 mg total) by mouth 2 (two) times daily as needed for dizziness.  . meloxicam (MOBIC) 15 MG tablet Take 1 tablet (15 mg total) by mouth daily as needed for pain.  . metFORMIN (GLUCOPHAGE-XR) 500 MG 24 hr tablet Take 2 tablets (1,000 mg total) by mouth 2 (two) times daily with a meal.  . Multiple Vitamin (MULTI-VITAMIN DAILY PO) Take by mouth.  . Omega-3 Fatty Acids (FISH OIL PO) Take by mouth.  . rosuvastatin (CRESTOR) 10 MG tablet Take 1 tablet (10 mg total) by mouth at bedtime.  . Semaglutide (RYBELSUS) 7 MG TABS Take 1 tablet by mouth daily. In 1 month  . Vitamin D, Cholecalciferol, 1000 units CAPS Take 2,000 Int'l Units/day by mouth.   . zolpidem (AMBIEN) 5 MG tablet Take 1 tablet (5 mg total) by mouth at bedtime as needed for sleep.  . [DISCONTINUED] glipiZIDE (GLUCOTROL XL) 10 MG 24 hr tablet Take 1 tablet (10 mg total) by mouth daily with breakfast.  . [DISCONTINUED] rosuvastatin  (CRESTOR) 10 MG tablet Take 1 tablet (10 mg total) by mouth at bedtime.  . [DISCONTINUED] zolpidem (AMBIEN) 5 MG tablet Take 1 tablet (5 mg total) by mouth at bedtime as needed for sleep.   Allergies  Allergen Reactions  . Lisinopril     Cough  Recent Results (from the past 2160 hour(s))  Microalbumin / creatinine urine ratio     Status: None   Collection Time: 08/07/20  9:48 AM  Result Value Ref Range   Creatinine, Urine 92 20 - 275 mg/dL   Microalb, Ur 0.7 mg/dL    Comment: Reference Range Not established    Microalb Creat Ratio 8 <30 mcg/mg creat    Comment: . The ADA defines abnormalities in albumin excretion as follows: Marland Kitchen Albuminuria Category        Result (mcg/mg creatinine) . Normal to Mildly increased   <30 Moderately increased         30-299  Severely increased           > OR = 300 . The ADA recommends that at least two of three specimens collected within a 3-6 month period be abnormal before considering a patient to be within a diagnostic category.   Lipid panel     Status: None   Collection Time: 08/07/20  9:48 AM  Result Value Ref Range   Cholesterol 109 0 - 200 mg/dL    Comment: ATP III Classification       Desirable:  < 200 mg/dL               Borderline High:  200 - 239 mg/dL          High:  > = 875 mg/dL   Triglycerides 160.6 0 - 149 mg/dL    Comment: Normal:  <107 mg/dLBorderline High:  150 - 199 mg/dL   HDL 74.21 >78.27 mg/dL   VLDL 49.7 0.0 - 68.0 mg/dL   LDL Cholesterol 38 0 - 99 mg/dL   Total CHOL/HDL Ratio 3     Comment:                Men          Women1/2 Average Risk     3.4          3.3Average Risk          5.0          4.42X Average Risk          9.6          7.13X Average Risk          15.0          11.0                       NonHDL 68.14     Comment: NOTE:  Non-HDL goal should be 30 mg/dL higher than patient's LDL goal (i.e. LDL goal of < 70 mg/dL, would have non-HDL goal of < 100 mg/dL)  Hemoglobin S6Z     Status: Abnormal   Collection  Time: 08/07/20  9:48 AM  Result Value Ref Range   Hgb A1c MFr Bld 6.8 (H) 4.6 - 6.5 %    Comment: Glycemic Control Guidelines for People with Diabetes:Non Diabetic:  <6%Goal of Therapy: <7%Additional Action Suggested:  >8%   Comprehensive metabolic panel     Status: Abnormal   Collection Time: 08/07/20  9:48 AM  Result Value Ref Range   Sodium 135 135 - 145 mEq/L   Potassium 4.7 3.5 - 5.1 mEq/L   Chloride 98 96 - 112 mEq/L   CO2 29 19 - 32 mEq/L   Glucose, Bld 139 (H) 70 - 99 mg/dL   BUN 17 6 - 23 mg/dL   Creatinine, Ser 3.81 0.40 -  1.20 mg/dL   Total Bilirubin 0.5 0.2 - 1.2 mg/dL   Alkaline Phosphatase 42 39 - 117 U/L   AST 23 0 - 37 U/L   ALT 41 (H) 0 - 35 U/L   Total Protein 7.1 6.0 - 8.3 g/dL   Albumin 4.7 3.5 - 5.2 g/dL   GFR 91.14 >60.00 mL/min    Comment: Calculated using the CKD-EPI Creatinine Equation (2021)   Calcium 9.8 8.4 - 10.5 mg/dL  CBC with Differential/Platelet     Status: None   Collection Time: 08/07/20  9:48 AM  Result Value Ref Range   WBC 7.8 4.0 - 10.5 K/uL   RBC 4.86 3.87 - 5.11 Mil/uL   Hemoglobin 13.5 12.0 - 15.0 g/dL   HCT 40.3 36 - 46 %   MCV 82.9 78.0 - 100.0 fl   MCHC 33.5 30.0 - 36.0 g/dL   RDW 13.8 11.5 - 15.5 %   Platelets 280.0 150 - 400 K/uL   Neutrophils Relative % 45.0 43 - 77 %   Lymphocytes Relative 43.7 12 - 46 %   Monocytes Relative 8.0 3 - 12 %   Eosinophils Relative 2.6 0 - 5 %   Basophils Relative 0.7 0 - 3 %   Neutro Abs 3.5 1.4 - 7.7 K/uL   Lymphs Abs 3.4 0.7 - 4.0 K/uL   Monocytes Absolute 0.6 0.1 - 1.0 K/uL   Eosinophils Absolute 0.2 0.0 - 0.7 K/uL   Basophils Absolute 0.1 0.0 - 0.1 K/uL   Objective  Body mass index is 25.56 kg/m. Wt Readings from Last 3 Encounters:  08/06/20 163 lb 3.2 oz (74 kg)  04/21/20 165 lb (74.8 kg)  04/03/20 165 lb 3.2 oz (74.9 kg)   Temp Readings from Last 3 Encounters:  08/06/20 98.2 F (36.8 C) (Oral)  04/03/20 98.4 F (36.9 C) (Oral)  03/13/20 97.6 F (36.4 C)   BP Readings from  Last 3 Encounters:  08/06/20 140/82  04/03/20 (!) 152/88  03/13/20 (!) 150/96   Pulse Readings from Last 3 Encounters:  08/06/20 93  04/03/20 97  03/13/20 92    Physical Exam Vitals and nursing note reviewed.  Constitutional:      Appearance: Normal appearance. She is well-developed, well-groomed and overweight.  HENT:     Head: Normocephalic and atraumatic.  Eyes:     Conjunctiva/sclera: Conjunctivae normal.     Pupils: Pupils are equal, round, and reactive to light.  Cardiovascular:     Rate and Rhythm: Normal rate and regular rhythm.     Heart sounds: Normal heart sounds. No murmur heard.   Pulmonary:     Effort: Pulmonary effort is normal.     Breath sounds: Normal breath sounds.  Abdominal:     Tenderness: There is no abdominal tenderness.  Skin:    General: Skin is warm and dry.  Neurological:     General: No focal deficit present.     Mental Status: She is alert and oriented to person, place, and time. Mental status is at baseline.     Gait: Gait normal.  Psychiatric:        Attention and Perception: Attention and perception normal.        Mood and Affect: Mood and affect normal.        Speech: Speech normal.        Behavior: Behavior normal. Behavior is cooperative.        Thought Content: Thought content normal.        Cognition  and Memory: Cognition and memory normal.        Judgment: Judgment normal.     Assessment  Plan  Hypertension associated with diabetes ([place]) - Plan: CBC with Differential/Platelet, Comprehensive metabolic panel, Hemoglobin A1c, Lipid panel, Microalbumin / creatinine urine ratio Foot exam today  Records get AE  Monitor BP  Cont meds  Rybelsus 7 mg qd, metformin xr 1000 mg bid, glipizide 10 mg xl, norvasc 2.5 mg qd, hyzaar 100-25 mg qd, crestor 10 mg qhs    Insomnia, unspecified type - Plan: zolpidem (AMBIEN) 5 MG tablet  HM Flu shot utd prevnar had 02/02/17 pna 23utd MMR immune covid vaccine 3/3 moderna  1/2 shingrix    Rx shingrixin pastmailed Rx as well as twinrix 3/3/ doses mailed 10/28/20unclear if had as of 11/01/19 Tdap2/4/17  hepA/Bconsider  -consider in future fatty liver  Hep C negative 12/17/16 Consider MMR vaccine  RequestedRecords labs, vaccines,pap 2015 and colonoscopy lastPCP cant find report but reports due in 02/2020  PCPper pt had 05/2014 normal mom +colon cancer due 05/2019  DEXA 03/16/17 normal scanned in chart   Mammogram8/621 negative  Colonoscopy pt wants to do early in 2021disc 11/01/19 and wants to still wait no alarm sx's but she does have FH mom + colon cancer -referred [facility]   Out of age window pap no records from prior PCP  Est. Dermatology Dr. Kellie Moor h/o [facility] resch3/2021  6/24/[facility] [address] [facility]  Eye surgery tbd ? scheduled 11/08/19 pt moved back later date Est AE   Dermatology saw spring 2021 f/u in 1 year   [facility] [place] [facility] [phone] disccolonoscopyprev report  H/o Dr. Janese Banks  HT local pharmacy  [facility]    Provider: Dr. Olivia Mackie McLean-Scocuzza-Internal Medicine

## 2020-08-06 NOTE — Patient Instructions (Signed)
Twinrix vaccine recommended (3 doses) hep A/B vaccine  Referred [facility] in [place]  Hepatitis A; Hepatitis B Vaccine injection What is this medicine? HEPATITIS A VACCINE; HEPATITIS B VACCINE (hep uh TAHY tis A vak SEEN; hep uh TAHY tis B vak SEEN) is a vaccine to protect from an infection with the hepatitis A and B virus. This vaccine does not contain the live viruses. It will not cause a hepatitis infection. This medicine may be used for other purposes; ask your health care provider or pharmacist if you have questions. COMMON BRAND NAME(S): Twinrix What should I tell my health care provider before I take this medicine? They need to know if you have any of these conditions:  bleeding disorder  fever or infection  heart disease  immune system problems  an unusual or allergic reaction to hepatitis A or B vaccine, neomycin, yeast, thimerosal, other medicines, foods, dyes, or preservatives  pregnant or trying to get pregnant  breast-feeding How should I use this medicine? This vaccine is for injection into a muscle. It is given by a health care professional. A copy of Vaccine Information Statements will be given before each vaccination. Read this sheet carefully each time. The sheet may change frequently. Talk to your pediatrician regarding the use of this medicine in children. Special care may be needed. Overdosage: If you think you have taken too much of this medicine contact a poison control center or emergency room at once. NOTE: This medicine is only for you. Do not share this medicine with others. What if I miss a dose? It is important not to miss your dose. Call your doctor or health care professional if you are unable to keep an appointment. What may interact with this medicine?  medicines that suppress your immune function like adalimumab, anakinra, infliximab  medicines to treat cancer  steroid medicines like prednisone or cortisone This list may not describe all  possible interactions. Give your health care provider a list of all the medicines, herbs, non-prescription drugs, or dietary supplements you use. Also tell them if you smoke, drink alcohol, or use illegal drugs. Some items may interact with your medicine. What should I watch for while using this medicine? See your health care provider for all shots of this vaccine as directed. You must have 3 to 4 shots of this vaccine for protection from hepatitis A and B infection. Tell your doctor right away if you have any serious or unusual side effects after getting this vaccine. What side effects may I notice from receiving this medicine? Side effects that you should report to your doctor or health care professional as soon as possible:  allergic reactions like skin rash, itching or hives, swelling of the face, lips, or tongue  breathing problems  confused, irritated  fast, irregular heartbeat  flu-like syndrome  numb, tingling pain  seizures Side effects that usually do not require medical attention (report to your doctor or health care professional if they continue or are bothersome):  diarrhea  fever  headache  loss of appetite  muscle pain  nausea  pain, redness, swelling, or irritation at site where injected  tiredness This list may not describe all possible side effects. Call your doctor for medical advice about side effects. You may report side effects to FDA at 1-800-FDA-1088. Where should I keep my medicine? This drug is given in a hospital or clinic and will not be stored at home. NOTE: This sheet is a summary. It may not cover all possible  information. If you have questions about this medicine, talk to your doctor, pharmacist, or health care provider.  2020 Elsevier/Gold Standard (2008-02-02 15:21:37)

## 2020-08-06 NOTE — Telephone Encounter (Signed)
Please place future orders for lab appt.  

## 2020-08-07 ENCOUNTER — Other Ambulatory Visit (INDEPENDENT_AMBULATORY_CARE_PROVIDER_SITE_OTHER): Payer: Medicare Other

## 2020-08-07 DIAGNOSIS — E1159 Type 2 diabetes mellitus with other circulatory complications: Secondary | ICD-10-CM | POA: Diagnosis not present

## 2020-08-07 DIAGNOSIS — I152 Hypertension secondary to endocrine disorders: Secondary | ICD-10-CM | POA: Diagnosis not present

## 2020-08-07 DIAGNOSIS — E1165 Type 2 diabetes mellitus with hyperglycemia: Secondary | ICD-10-CM

## 2020-08-07 LAB — CBC WITH DIFFERENTIAL/PLATELET
Basophils Absolute: 0.1 10*3/uL (ref 0.0–0.1)
Basophils Relative: 0.7 % (ref 0.0–3.0)
Eosinophils Absolute: 0.2 10*3/uL (ref 0.0–0.7)
Eosinophils Relative: 2.6 % (ref 0.0–5.0)
HCT: 40.3 % (ref 36.0–46.0)
Hemoglobin: 13.5 g/dL (ref 12.0–15.0)
Lymphocytes Relative: 43.7 % (ref 12.0–46.0)
Lymphs Abs: 3.4 10*3/uL (ref 0.7–4.0)
MCHC: 33.5 g/dL (ref 30.0–36.0)
MCV: 82.9 fl (ref 78.0–100.0)
Monocytes Absolute: 0.6 10*3/uL (ref 0.1–1.0)
Monocytes Relative: 8 % (ref 3.0–12.0)
Neutro Abs: 3.5 10*3/uL (ref 1.4–7.7)
Neutrophils Relative %: 45 % (ref 43.0–77.0)
Platelets: 280 10*3/uL (ref 150.0–400.0)
RBC: 4.86 Mil/uL (ref 3.87–5.11)
RDW: 13.8 % (ref 11.5–15.5)
WBC: 7.8 10*3/uL (ref 4.0–10.5)

## 2020-08-07 LAB — HEMOGLOBIN A1C: Hgb A1c MFr Bld: 6.8 % — ABNORMAL HIGH (ref 4.6–6.5)

## 2020-08-07 LAB — COMPREHENSIVE METABOLIC PANEL
ALT: 41 U/L — ABNORMAL HIGH (ref 0–35)
AST: 23 U/L (ref 0–37)
Albumin: 4.7 g/dL (ref 3.5–5.2)
Alkaline Phosphatase: 42 U/L (ref 39–117)
BUN: 17 mg/dL (ref 6–23)
CO2: 29 mEq/L (ref 19–32)
Calcium: 9.8 mg/dL (ref 8.4–10.5)
Chloride: 98 mEq/L (ref 96–112)
Creatinine, Ser: 0.61 mg/dL (ref 0.40–1.20)
GFR: 91.14 mL/min (ref >60.00)
Glucose, Bld: 139 mg/dL — ABNORMAL HIGH (ref 70–99)
Potassium: 4.7 mEq/L (ref 3.5–5.1)
Sodium: 135 mEq/L (ref 135–145)
Total Bilirubin: 0.5 mg/dL (ref 0.2–1.2)
Total Protein: 7.1 g/dL (ref 6.0–8.3)

## 2020-08-07 LAB — LIPID PANEL
Cholesterol: 109 mg/dL (ref 0–200)
HDL: 40.9 mg/dL (ref >39.00)
LDL Cholesterol: 38 mg/dL (ref 0–99)
NonHDL: 68.14
Total CHOL/HDL Ratio: 3
Triglycerides: 149 mg/dL (ref 0.0–149.0)
VLDL: 29.8 mg/dL (ref 0.0–40.0)

## 2020-08-08 LAB — MICROALBUMIN / CREATININE URINE RATIO
Creatinine, Urine: 92 mg/dL (ref 20–275)
Microalb Creat Ratio: 8 mcg/mg creat (ref <30)
Microalb, Ur: 0.7 mg/dL

## 2020-08-11 DIAGNOSIS — Z800 Family history of malignant neoplasm of digestive organs: Secondary | ICD-10-CM | POA: Insufficient documentation

## 2020-08-22 ENCOUNTER — Encounter: Payer: Self-pay | Admitting: Gastroenterology

## 2020-10-09 ENCOUNTER — Ambulatory Visit (INDEPENDENT_AMBULATORY_CARE_PROVIDER_SITE_OTHER): Payer: Medicare Other | Admitting: Gastroenterology

## 2020-10-09 ENCOUNTER — Encounter: Payer: Self-pay | Admitting: Gastroenterology

## 2020-10-09 VITALS — BP 144/68 | HR 86 | Ht 67.0 in | Wt 160.6 lb

## 2020-10-09 DIAGNOSIS — Z800 Family history of malignant neoplasm of digestive organs: Secondary | ICD-10-CM | POA: Diagnosis not present

## 2020-10-09 MED ORDER — PLENVU 140 G PO SOLR: 140.0000 g | ORAL | 0 refills | Status: DC

## 2020-10-09 NOTE — Progress Notes (Addendum)
[facility] Consult Note:  History: Christy Stout 10/09/2020  Referring provider: McLean-Scocuzza, Nino Glow, MD  Reason for consult/chief complaint: Colon Cancer Screening (Patient here to discuss colonoscopy for crc screening- patient denies any current GI complaints with the exception of occasional constipation which she attributes to her medications. She is not on any anticoagulation. Patient has had 2 previous colonoscopies, both normal.)   Subjective  HPI:  This is a very pleasant 70 year old woman referred by primary care for family history of colon cancer.  Christy Stout has had multiple colonoscopies, most recent in 2015 (noted below).  Her mother was diagnosed with colon cancer at age 83, and it was reportedly at advanced stage at the time of diagnosis.  Her mother subsequently died from colon cancer at age 61, and Christy Stout feels that her mother probably did not have any previous screening since this was in the mid to late 1980s.  Lakeesha denies abdominal pain or rectal bleeding.  She has occasional constipation she attributes to some medication.  Denies any chronic upper digestive symptoms such as heartburn, dysphagia, odynophagia, nausea, vomiting, loss of appetite, and she says her weight is stable.  ROS:  Review of Systems  She denies chest pain dyspnea or dysuria.  Past Medical History: Past Medical History:  Diagnosis Date  . Arthritis   . Breast cancer, right ([place]) 2000    lumpectomy 8 rounds chemo, 6 weeks radiation mammo 02/24/17 negative on Arimedex x 7 years   . Diabetes mellitus without complication ([place])   . Diverticulosis   . History of chicken pox   . Hyperlipidemia   . Hypertension   . Personal history of chemotherapy 2000   Rt. Breast  . Personal history of radiation therapy 2000   Rt. breast  . UTI (urinary tract infection)      Past Surgical History: Past Surgical History:  Procedure Laterality Date  . BREAST EXCISIONAL BIOPSY  Right 2000   +  . BREAST SURGERY     2000  . WRIST SURGERY Left      Family History: Family History  Problem Relation Age of Onset  . Colon cancer Mother 72  . Heart disease Father   . Stroke Father   . Arthritis Brother   . Diabetes Brother   . Breast cancer Maternal Grandmother   . Heart disease Maternal Grandfather   . Arthritis Brother   . Alcohol abuse Son   . Hypertension Son   . Esophageal cancer Neg Hx   . Stomach cancer Neg Hx   . Liver disease Neg Hx     Social History: Social History   Socioeconomic History  . Marital status: Married    Spouse name: Not on file  . Number of children: Not on file  . Years of education: Not on file  . Highest education level: Not on file  Occupational History  . Not on file  Tobacco Use  . Smoking status: Never Smoker  . Smokeless tobacco: Never Used  Substance and Sexual Activity  . Alcohol use: Yes    Comment: 2 drinks per month  . Drug use: Not Currently  . Sexual activity: Yes    Partners: Male  Other Topics Concern  . 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 Lisinopril     Cough     Outpatient Meds: Current Outpatient Medications  Medication Sig Dispense Refill  . Accu-Chek Softclix Lancets lancets Used to check blood sugars once per day. Softclix lancets or ask pt which she prefers 100 each 4  . amLODipine (NORVASC) 2.5 MG tablet Take 1 tablet (2.5 mg total) by  mouth daily. 90 tablet 3  . aspirin EC 81 MG tablet Take 81 mg by mouth daily.    . blood glucose meter kit and supplies KIT Dispense based on patient and insurance preference. Use up to four times daily as directed. (FOR ICD-9 250.00, 250.01). 1 each 0  . blood glucose meter kit and supplies Dispense based on patient and insurance preference. Bid  directed. (FOR ICD-10 E10.9, E11.9). 1 year supply strips and lancets 1 each 0  . Blood Glucose Monitoring Suppl (ACCU-CHEK GUIDE) w/Device KIT 1 Device by Does not apply route daily. 1 kit 0  . glipiZIDE (GLUCOTROL XL) 10 MG 24 hr tablet Take 1 tablet (10 mg total) by mouth daily with breakfast. 90 tablet 3  . glucose blood (ACCU-CHEK GUIDE) test strip Used to check blood sugar once per day E11.9. 100 each 5  . losartan-hydrochlorothiazide (HYZAAR) 100-25 MG tablet Take 1 tablet by mouth daily. In am 90 tablet 3  . Magnesium 250 MG TABS Take by mouth daily.    . meloxicam (MOBIC) 15 MG tablet Take 1 tablet (15 mg total) by mouth daily as needed for pain. 90 tablet 3  . metFORMIN (GLUCOPHAGE-XR) 500 MG 24 hr tablet Take 2 tablets (1,000 mg total) by mouth 2 (two) times daily with a meal. 360 tablet 3  . Multiple Vitamin (MULTI-VITAMIN DAILY PO) Take by mouth.    . Omega-3 Fatty Acids (FISH OIL PO) Take by mouth.    Marland Kitchen PEG-KCl-NaCl-NaSulf-Na Asc-C (PLENVU) 140 g SOLR Take 140 g by mouth as directed. Manufacturer's coupon Universal coupon code:BIN: P2366821; GROUP: [id number]; PCN: [id number]; ID: [id number]; PAY NO MORE $50 1 each 0  . rosuvastatin (CRESTOR) 10 MG tablet Take 1 tablet (10 mg total) by mouth at bedtime. 90 tablet 3  . Semaglutide (RYBELSUS) 7 MG TABS Take 1 tablet by mouth daily. In 1 month 90 tablet 3  . Vitamin D, Cholecalciferol, 1000 units CAPS Take 2,000 Int'l Units/day by mouth.     . zolpidem (AMBIEN) 5 MG tablet Take 1 tablet (5 mg total) by mouth at bedtime as needed for sleep. 90 tablet 1  . meclizine (ANTIVERT) 12.5 MG tablet Take 1  tablet (12.5 mg total) by mouth 2 (two) times daily as needed for dizziness. (Patient not taking: Reported on 10/09/2020) 60 tablet 0   No current facility-administered medications for this visit.      ___________________________________________________________________ Objective   Exam:  BP (!) 144/68   Pulse 86   Ht $R'5\' 7"'Vj$  (1.702 m)   Wt 160 lb 9.6 oz (72.8 kg)   SpO2 98%   BMI 25.15 kg/m  Wt Readings from Last 3 Encounters:  10/09/20 160 lb 9.6  oz (72.8 kg)  08/06/20 163 lb 3.2 oz (74 kg)  04/21/20 165 lb (74.8 kg)     General: Well-appearing  Eyes: sclera anicteric, no redness  ENT: oral mucosa moist without lesions, no cervical or supraclavicular lymphadenopathy  CV: RRR without murmur, S1/S2, no JVD, no peripheral edema  Resp: clear to auscultation bilaterally, normal RR and effort noted  GI: soft, no tenderness, with active bowel sounds. No guarding or palpable organomegaly noted.  Skin; warm and dry, no rash or jaundice noted  Neuro: awake, alert and oriented x 3. Normal gross motor function and fluent speech  Labs:  She tells me her last hemoglobin A1c was under 7  Radiologic Studies: Photos from colonoscopies 05/2014 and 02/2011 (Dr. Marvis Repress, [place], [place])  Show complete exams, good preps, left sided tics and no polyps (full procedure reports not available)    Assessment: Encounter Diagnosis  Name Primary?  . Family history of colon cancer Yes    Family history colorectal cancer, due for screening colonoscopy. We discussed current guidelines related to screening of first-degree family members when the cancer was diagnosed over age 29.  That plus Victoria's lack of polyps on last 2 colonoscopies puts her in a lower risk category.  If she has a complete exam with good preparation and no polyps found this time, guidelines would indicate no further screening colonoscopy  Plan:  She was agreeable to colonoscopy after discussion of procedure and  risks.  The benefits and risks of the planned procedure were described in detail with the patient or (when appropriate) their health care proxy.  Risks were outlined as including, but not limited to, bleeding, infection, perforation, adverse medication reaction leading to cardiac or pulmonary decompensation, pancreatitis (if ERCP).  The limitation of incomplete mucosal visualization was also discussed.  No guarantees or warranties were given.   Thank you for the courtesy of this consult.  Please call me with any questions or concerns.  Nelida Meuse III  CC: Referring provider noted above

## 2020-10-09 NOTE — Patient Instructions (Signed)
If you are age 70 or older, your body mass index should be between 23-30. Your Body mass index is 25.15 kg/m. If this is out of the aforementioned range listed, please consider follow up with your Primary Care Provider.  If you are age 65 or younger, your body mass index should be between 19-25. Your Body mass index is 25.15 kg/m. If this is out of the aformentioned range listed, please consider follow up with your Primary Care Provider.   You have been scheduled for a colonoscopy. Please follow written instructions given to you at your visit today.  Please pick up your prep supplies at the pharmacy within the next 1-3 days. If you use inhalers (even only as needed), please bring them with you on the day of your procedure.  Due to recent changes in healthcare laws, you may see the results of your imaging and laboratory studies on MyChart before your provider has had a chance to review them.  We understand that in some cases there may be results that are confusing or concerning to you. Not all laboratory results come back in the same time frame and the provider may be waiting for multiple results in order to interpret others.  Please give [place] 48 hours in order for your provider to thoroughly review all the results before contacting the office for clarification of your results.   It was a pleasure to see you today!  Dr. Myrtie Neither

## 2020-11-10 ENCOUNTER — Encounter: Payer: Self-pay | Admitting: Gastroenterology

## 2020-11-10 ENCOUNTER — Other Ambulatory Visit: Payer: Self-pay

## 2020-11-10 ENCOUNTER — Ambulatory Visit (AMBULATORY_SURGERY_CENTER): Payer: Medicare Other | Admitting: Gastroenterology

## 2020-11-10 VITALS — BP 124/67 | HR 69 | Temp 97.0°F | Resp 13

## 2020-11-10 DIAGNOSIS — D122 Benign neoplasm of ascending colon: Secondary | ICD-10-CM | POA: Diagnosis not present

## 2020-11-10 DIAGNOSIS — D125 Benign neoplasm of sigmoid colon: Secondary | ICD-10-CM | POA: Diagnosis not present

## 2020-11-10 DIAGNOSIS — D120 Benign neoplasm of cecum: Secondary | ICD-10-CM | POA: Diagnosis not present

## 2020-11-10 DIAGNOSIS — E119 Type 2 diabetes mellitus without complications: Secondary | ICD-10-CM | POA: Diagnosis not present

## 2020-11-10 DIAGNOSIS — Z1211 Encounter for screening for malignant neoplasm of colon: Secondary | ICD-10-CM | POA: Diagnosis not present

## 2020-11-10 DIAGNOSIS — Z800 Family history of malignant neoplasm of digestive organs: Secondary | ICD-10-CM

## 2020-11-10 DIAGNOSIS — I10 Essential (primary) hypertension: Secondary | ICD-10-CM | POA: Diagnosis not present

## 2020-11-10 MED ORDER — SODIUM CHLORIDE 0.9 % IV SOLN: 500.0000 mL | Freq: Once | INTRAVENOUS | Status: AC

## 2020-11-10 NOTE — Patient Instructions (Signed)
Resume previous medications.  3 polyps removed and sent to pathology.  Diverticulosis.  Await pathology for final recommendations.  Handouts on findings given to patient.    YOU HAD AN ENDOSCOPIC PROCEDURE TODAY AT [facility]:   Refer to the procedure report that was given to you for any specific questions about what was found during the examination.  If the procedure report does not answer your questions, please call your gastroenterologist to clarify.  If you requested that your care partner not be given the details of your procedure findings, then the procedure report has been included in a sealed envelope for you to review at your convenience later.  YOU SHOULD EXPECT: Some feelings of bloating in the abdomen. Passage of more gas than usual.  Walking can help get rid of the air that was put into your GI tract during the procedure and reduce the bloating. If you had a lower endoscopy (such as a colonoscopy or flexible sigmoidoscopy) you may notice spotting of blood in your stool or on the toilet paper. If you underwent a bowel prep for your procedure, you may not have a normal bowel movement for a few days.  Please Note:  You might notice some irritation and congestion in your nose or some drainage.  This is from the oxygen used during your procedure.  There is no need for concern and it should clear up in a day or so.  SYMPTOMS TO REPORT IMMEDIATELY:   Following lower endoscopy (colonoscopy or flexible sigmoidoscopy):  Excessive amounts of blood in the stool  Significant tenderness or worsening of abdominal pains  Swelling of the abdomen that is new, acute  Fever of 100F or higher   For urgent or emergent issues, a gastroenterologist can be reached at any hour by calling [phone]. Do not use MyChart messaging for urgent concerns.    DIET:  We do recommend a small meal at first, but then you may proceed to your regular diet.  Drink plenty of fluids but you should  avoid alcoholic beverages for 24 hours.  ACTIVITY:  You should plan to take it easy for the rest of today and you should NOT DRIVE or use heavy machinery until tomorrow (because of the sedation medicines used during the test).    FOLLOW UP: Our staff will call the number listed on your records 48-72 hours following your procedure to check on you and address any questions or concerns that you may have regarding the information given to you following your procedure. If we do not reach you, we will leave a message.  We will attempt to reach you two times.  During this call, we will ask if you have developed any symptoms of COVID 19. If you develop any symptoms (ie: fever, flu-like symptoms, shortness of breath, cough etc.) before then, please call [phone].  If you test positive for Covid 19 in the 2 weeks post procedure, please call and report this information to [place].    If any biopsies were taken you will be contacted by phone or by letter within the next 1-3 weeks.  Please call us at [phone] if you have not heard about the biopsies in 3 weeks.    SIGNATURES/CONFIDENTIALITY: You and/or your care partner have signed paperwork which will be entered into your electronic medical record.  These signatures attest to the fact that that the information above on your After Visit Summary has been reviewed and is understood.  Full responsibility of the  confidentiality of this discharge information lies with you and/or your care-partner. 

## 2020-11-10 NOTE — Op Note (Signed)
Christy Stout Procedure Date: 11/10/2020 2:34 PM MRN: [id number] Endoscopist: Bricelyn. Loletha Carrow , MD Age: 70 Referring MD:  Date of Birth: 1951-01-06 Gender: Female Account #: [id number] Procedure:                Colonoscopy Indications:              Screening in patient at increased risk: Colorectal                            cancer in mother 53 or older (no polyps on 2015 ans                            2012 colonoscopies done in [place]) Medicines:                Monitored Anesthesia Care Procedure:                Pre-Anesthesia Assessment:                           - Prior to the procedure, a History and Physical                            was performed, and patient medications and                            allergies were reviewed. The patient's tolerance of                            previous anesthesia was also reviewed. The risks                            and benefits of the procedure and the sedation                            options and risks were discussed with the patient.                            All questions were answered, and informed consent                            was obtained. Prior Anticoagulants: The patient has                            taken no previous anticoagulant or antiplatelet                            agents. ASA Grade Assessment: II - A patient with                            mild systemic disease. After reviewing the risks                            and benefits, the patient was deemed in  satisfactory condition to undergo the procedure.                           After obtaining informed consent, the colonoscope                            was passed under direct vision. Throughout the                            procedure, the patient's blood pressure, pulse, and                            oxygen saturations were monitored continuously. The                            Olympus CF-HQ190L  [id number]) Colonoscope was                            introduced through the anus and advanced to the the                            cecum, identified by appendiceal orifice and                            ileocecal valve. The colonoscopy was performed with                            difficulty due to multiple diverticula in the                            colon, a redundant colon, significant looping and a                            tortuous colon. Successful completion of the                            procedure was aided by using manual pressure. The                            patient tolerated the procedure well. The quality                            of the bowel preparation was good. The ileocecal                            valve, appendiceal orifice, and rectum were                            photographed. Scope In: 2:40:02 PM Scope Out: 3:07:22 PM Scope Withdrawal Time: 0 hours 20 minutes 10 seconds  Total Procedure Duration: 0 hours 27 minutes 20 seconds  Findings:                 The perianal and digital rectal examinations were  normal.                           Multiple diverticula were found in the left colon                            and right colon.                           A 10-12 mm polyp was found in the cecum (base of                            cecum, near AO). The polyp was multi-lobulated and                            sessile. The polyp was removed with a piecemeal                            technique using a cold snare. Resection and                            retrieval were complete.                           A diminutive polyp was found in the proximal                            ascending colon. The polyp was semi-sessile. The                            polyp was removed with a cold snare. Resection and                            retrieval were complete.                           A 5 mm polyp was found in the sigmoid colon. The                             polyp was sessile. The polyp was removed with a                            cold snare. Resection and retrieval were complete.                           The exam was otherwise without abnormality on                            direct and retroflexion views. Complications:            No immediate complications. Estimated Blood Loss:     Estimated blood loss was minimal. Impression:               - Diverticulosis in the left colon and in the right  colon.                           - One 10-12 mm polyp in the cecum, removed                            piecemeal using a cold snare. Resected and                            retrieved.                           - One diminutive polyp in the proximal ascending                            colon, removed with a cold snare. Resected and                            retrieved.                           - One 5 mm polyp in the sigmoid colon, removed with                            a cold snare. Resected and retrieved.                           - The examination was otherwise normal on direct                            and retroflexion views. Recommendation:           - Patient has a contact number available for                            emergencies. The signs and symptoms of potential                            delayed complications were discussed with the                            patient. Return to normal activities tomorrow.                            Written discharge instructions were provided to the                            patient.                           - Resume previous diet.                           - Continue present medications.                           - Await pathology results.                           -  Repeat colonoscopy is recommended for                            surveillance. The colonoscopy date will be                            determined after pathology results from today's                             exam become available for review. Koal Eslinger L. Loletha Carrow, MD 11/10/2020 3:14:28 PM This report has been signed electronically.

## 2020-11-10 NOTE — Progress Notes (Signed)
Called to room to assist during endoscopic procedure.  Patient ID and intended procedure confirmed with present staff. Received instructions for my participation in the procedure from the performing physician.  

## 2020-11-10 NOTE — Progress Notes (Signed)
Report given to PACU, vss 

## 2020-11-10 NOTE — Progress Notes (Signed)
VS by CW. ?

## 2020-11-12 ENCOUNTER — Telehealth: Payer: Self-pay | Admitting: *Deleted

## 2020-11-12 NOTE — Telephone Encounter (Signed)
  Follow up Call-  Call back number 11/10/2020  Post procedure Call Back phone  # [phone]  Permission to leave phone message Yes  Some recent data might be hidden     Patient questions:  Do you have a fever, pain , or abdominal swelling? No. Pain Score  0 *  Have you tolerated food without any problems? Yes.    Have you been able to return to your normal activities? Yes.    Do you have any questions about your discharge instructions: Diet   No. Medications  No. Follow up visit  No.  Do you have questions or concerns about your Care? No.  Actions: * If pain score is 4 or above: 1. No action needed, pain <4.Have you developed a fever since your procedure? no  2.   Have you had an respiratory symptoms (SOB or cough) since your procedure? no  3.   Have you tested positive for COVID 19 since your procedure no  4.   Have you had any family members/close contacts diagnosed with the COVID 19 since your procedure?  no   If yes to any of these questions please route to Joylene John, RN and Joella Prince, RN

## 2020-11-17 ENCOUNTER — Encounter: Payer: Self-pay | Admitting: Gastroenterology

## 2020-12-08 ENCOUNTER — Telehealth (INDEPENDENT_AMBULATORY_CARE_PROVIDER_SITE_OTHER): Payer: Medicare Other | Admitting: Internal Medicine

## 2020-12-08 ENCOUNTER — Encounter: Payer: Self-pay | Admitting: Internal Medicine

## 2020-12-08 ENCOUNTER — Telehealth: Payer: Self-pay | Admitting: Internal Medicine

## 2020-12-08 DIAGNOSIS — J069 Acute upper respiratory infection, unspecified: Secondary | ICD-10-CM

## 2020-12-08 DIAGNOSIS — E119 Type 2 diabetes mellitus without complications: Secondary | ICD-10-CM | POA: Diagnosis not present

## 2020-12-08 HISTORY — DX: Acute upper respiratory infection, unspecified: J06.9

## 2020-12-08 MED ORDER — ERYTHROMYCIN 5 MG/GM OP OINT: 1.0000 "application " | TOPICAL_OINTMENT | Freq: Every day | OPHTHALMIC | 0 refills | Status: DC

## 2020-12-08 MED ORDER — PREDNISONE 10 MG PO TABS: ORAL_TABLET | ORAL | 0 refills | Status: DC

## 2020-12-08 MED ORDER — CHERATUSSIN AC 100-10 MG/5ML PO SOLN: 5.0000 mL | Freq: Three times a day (TID) | ORAL | 0 refills | Status: DC | PRN

## 2020-12-08 NOTE — Assessment & Plan Note (Signed)
Symptoms suggestive of OMICRON but her home test has been negative x 2.  Will prescribed prednisone and cheratussin for the persistent cough.  Erythromycin ointment for the conjunctivitis which is likely viral given its bilateral presentation, but distressing to patient

## 2020-12-08 NOTE — Telephone Encounter (Signed)
Noted  

## 2020-12-08 NOTE — Telephone Encounter (Signed)
Patient was added to schedule today for virtual at 4:30 pm

## 2020-12-08 NOTE — Assessment & Plan Note (Signed)
Warned that the prednisone taper would likely elevated her BS for a few days and to call office if CBS > 300

## 2020-12-08 NOTE — Progress Notes (Signed)
Virtual Visit via [place]  This visit type was conducted due to national recommendations for restrictions regarding the COVID-19 pandemic (e.g. social distancing).  This format is felt to be most appropriate for this patient at this time.  All issues noted in this document were discussed and addressed.  No physical exam was performed (except for noted visual exam findings with Video Visits).   I connected with@ on 12/08/20 at  4:30 PM EST by a video enabled telemedicine application and verified that I am speaking with the correct person using two identifiers. Location patient: home Location provider: work or home office Persons participating in the virtual visit: patient, provider  I discussed the limitations, risks, security and privacy concerns of performing an evaluation and management service by telephone and the availability of in person appointments. I also discussed with the patient that there may be a patient responsible charge related to this service. The patient expressed understanding and agreed to proceed.  Reason for visit: URI  HPI:  70 yr old female with  type 2 DM, CAD, fatty liver,  History  Of  breast CA presents with 6 day history of cough, sinus congestion without pain,  And sore throat.  Symptoms of sore throat was most severe on day 1 but has been steadily improving.  Chills and body aches initially, now resolved.  Lots of PND triggering cough,  Worse lying down.  No dyspnea or pleurisy. Developed yellow conjunctival discharge bilaterally last night/this morning .     ROS: See pertinent positives and negatives per HPI.  Past Medical History:  Diagnosis Date  . Arthritis   . Breast cancer, right ([place]) 2000    lumpectomy 8 rounds chemo, 6 weeks radiation mammo 02/24/17 negative on Arimedex x 7 years   . Cataract   . Diabetes mellitus without complication ([place])   . Diverticulosis   . GERD (gastroesophageal reflux disease)   . History of chicken pox   .  Hyperlipidemia   . Hypertension   . Personal history of chemotherapy 2000   Rt. Breast  . Personal history of radiation therapy 2000   Rt. breast  . UTI (urinary tract infection)     Past Surgical History:  Procedure Laterality Date  . BREAST EXCISIONAL BIOPSY Right 2000   +  . BREAST SURGERY     2000  . WRIST SURGERY Left     Family History  Problem Relation Age of Onset  . Colon cancer Mother 81  . Heart disease Father   . Stroke Father   . Arthritis Brother   . Diabetes Brother   . Breast cancer Maternal Grandmother   . Heart disease Maternal Grandfather   . Arthritis Brother   . Alcohol abuse Son   . Hypertension Son   . Esophageal cancer Neg Hx   . Stomach cancer Neg Hx   . Liver disease Neg Hx     SOCIAL HX:  reports that she has never smoked. She has never used smokeless tobacco. She reports current alcohol use. She reports previous drug use.   Current Outpatient Medications:  .  Accu-Chek Softclix Lancets lancets, Used to check blood sugars once per day. Softclix lancets or ask pt which she prefers, Disp: 100 each, Rfl: 4 .  amLODipine (NORVASC) 2.5 MG tablet, Take 1 tablet (2.5 mg total) by mouth daily., Disp: 90 tablet, Rfl: 3 .  aspirin EC 81 MG tablet, Take 81 mg by mouth daily., Disp: , Rfl:  .  blood glucose meter  kit and supplies KIT, Dispense based on patient and insurance preference. Use up to four times daily as directed. (FOR ICD-9 250.00, 250.01)., Disp: 1 each, Rfl: 0 .  blood glucose meter kit and supplies, Dispense based on patient and insurance preference. Bid  directed. (FOR ICD-10 E10.9, E11.9). 1 year supply strips and lancets, Disp: 1 each, Rfl: 0 .  Blood Glucose Monitoring Suppl (ACCU-CHEK GUIDE) w/Device KIT, 1 Device by Does not apply route daily., Disp: 1 kit, Rfl: 0 .  erythromycin ophthalmic ointment, Place 1 application into both eyes at bedtime., Disp: 3 each, Rfl: 0 .  glipiZIDE (GLUCOTROL XL) 10 MG 24 hr tablet, Take 1 tablet (10 mg  total) by mouth daily with breakfast., Disp: 90 tablet, Rfl: 3 .  glucose blood (ACCU-CHEK GUIDE) test strip, Used to check blood sugar once per day E11.9., Disp: 100 each, Rfl: 5 .  guaiFENesin-codeine (CHERATUSSIN AC) 100-10 MG/5ML syrup, Take 5 mLs by mouth 3 (three) times daily as needed for cough., Disp: 180 mL, Rfl: 0 .  losartan-hydrochlorothiazide (HYZAAR) 100-25 MG tablet, Take 1 tablet by mouth daily. In am, Disp: 90 tablet, Rfl: 3 .  Magnesium 250 MG TABS, Take by mouth daily., Disp: , Rfl:  .  meloxicam (MOBIC) 15 MG tablet, Take 1 tablet (15 mg total) by mouth daily as needed for pain., Disp: 90 tablet, Rfl: 3 .  metFORMIN (GLUCOPHAGE-XR) 500 MG 24 hr tablet, Take 2 tablets (1,000 mg total) by mouth 2 (two) times daily with a meal., Disp: 360 tablet, Rfl: 3 .  Multiple Vitamin (MULTI-VITAMIN DAILY PO), Take by mouth., Disp: , Rfl:  .  Omega-3 Fatty Acids (FISH OIL PO), Take by mouth., Disp: , Rfl:  .  predniSONE (DELTASONE) 10 MG tablet, 6 tablets on Day 1 , then reduce by 1 tablet daily until gone, Disp: 21 tablet, Rfl: 0 .  rosuvastatin (CRESTOR) 10 MG tablet, Take 1 tablet (10 mg total) by mouth at bedtime., Disp: 90 tablet, Rfl: 3 .  Semaglutide (RYBELSUS) 7 MG TABS, Take 1 tablet by mouth daily. In 1 month, Disp: 90 tablet, Rfl: 3 .  Vitamin D, Cholecalciferol, 1000 units CAPS, Take 2,000 Int'l Units/day by mouth. , Disp: , Rfl:  .  zolpidem (AMBIEN) 5 MG tablet, Take 1 tablet (5 mg total) by mouth at bedtime as needed for sleep., Disp: 90 tablet, Rfl: 1 .  meclizine (ANTIVERT) 12.5 MG tablet, Take 1 tablet (12.5 mg total) by mouth 2 (two) times daily as needed for dizziness. (Patient not taking: No sig reported), Disp: 60 tablet, Rfl: 0  Current Facility-Administered Medications:  .  0.9 %  sodium chloride infusion, 500 mL, Intravenous, Once, Danis, Kirke Corin, MD  EXAM:  VITALS per patient if applicable:  GENERAL: alert, oriented, appears well and in no acute  distress  HEENT: atraumatic, conjunttiva clear, no obvious abnormalities on inspection of external nose and ears  NECK: normal movements of the head and neck  LUNGS: on inspection no signs of respiratory distress, breathing rate appears normal, no obvious gross SOB, gasping or wheezing  CV: no obvious cyanosis  MS: moves all visible extremities without noticeable abnormality  PSYCH/NEURO: pleasant and cooperative, no obvious depression or anxiety, speech and thought processing grossly intact  ASSESSMENT AND PLAN:  Discussed the following assessment and plan:  URI, acute  Type 2 diabetes mellitus without complication, without long-term current use of insulin (HCC)  URI, acute Symptoms suggestive of OMICRON but her home test has been negative  x 2.  Will prescribed prednisone and cheratussin for the persistent cough.  Erythromycin ointment for the conjunctivitis which is likely viral given its bilateral presentation, but distressing to patient   Type 2 diabetes mellitus without complication, without long-term current use of insulin ([place]) Warned that the prednisone taper would likely elevated her BS for a few days and to call office if CBS > 300    I discussed the assessment and treatment plan with the patient. The patient was provided an opportunity to ask questions and all were answered. The patient agreed with the plan and demonstrated an understanding of the instructions.   The patient was advised to call back or seek an in-person evaluation if the symptoms worsen or if the condition fails to improve as anticipated.   I spent 20 minutes dedicated to the care of this patient on the date of this encounter to include pre-visit review of his medical history,  Face-to-face time with the patient , and post visit ordering of testing and therapeutics.    Crecencio Mc, MD

## 2020-12-08 NOTE — Progress Notes (Signed)
BS 2-3 hours post prandial was 124.

## 2020-12-19 ENCOUNTER — Ambulatory Visit
Admission: EM | Admit: 2020-12-19 | Discharge: 2020-12-19 | Disposition: A | Discharge status: Home / Self Care | Payer: Medicare Other | Attending: Family Medicine | Admitting: Family Medicine

## 2020-12-19 ENCOUNTER — Other Ambulatory Visit: Payer: Self-pay

## 2020-12-19 DIAGNOSIS — N3001 Acute cystitis with hematuria: Secondary | ICD-10-CM | POA: Diagnosis not present

## 2020-12-19 LAB — POCT URINALYSIS DIP (MANUAL ENTRY)
Bilirubin, UA: NEGATIVE
Glucose, UA: NEGATIVE mg/dL
Ketones, POC UA: NEGATIVE mg/dL
Nitrite, UA: POSITIVE — AB
Protein Ur, POC: 100 mg/dL — AB
Spec Grav, UA: 1.02 (ref 1.010–1.025)
Urobilinogen, UA: 0.2 E.U./dL
pH, UA: 6.5 (ref 5.0–8.0)

## 2020-12-19 MED ORDER — SULFAMETHOXAZOLE-TRIMETHOPRIM 800-160 MG PO TABS: 1.0000 | ORAL_TABLET | Freq: Two times a day (BID) | ORAL | 0 refills | Status: AC

## 2020-12-19 NOTE — Discharge Instructions (Addendum)
Treating you for a urinary tract infection Take the antibiotics as prescribed Increase fluids  Follow up as needed for continued or worsening symptoms

## 2020-12-19 NOTE — ED Triage Notes (Signed)
Patient presents to Urgent Care with complaints of lower abdominal pain, increased urine frequency, cloudy urine, chills, and vaginal itching since weds. Patient treating her discomfort with ibuprofen. She has a hx of UTIs.   Denies hematuria or fever.

## 2020-12-20 NOTE — ED Provider Notes (Signed)
Christy Stout    CSN: [id number] Arrival date & time: 12/19/20  0940      History   Chief Complaint Chief Complaint  Patient presents with  . Urinary Tract Infection    HPI Christy Stout is a 70 y.o. female.   70 year old female presents today for possible urinary tract infection.  She is having lower abdominal discomfort, urinary frequency, cloudy urine, chills, mild vaginal itching.  Has been treating her symptoms with ibuprofen.  History of recurrent UTIs.  Denies any flank pain, nausea, vomiting or fevers.   Urinary Tract Infection   Past Medical History:  Diagnosis Date  . Arthritis   . Breast cancer, right ([place]) 2000    lumpectomy 8 rounds chemo, 6 weeks radiation mammo 02/24/17 negative on Arimedex x 7 years   . Cataract   . Diabetes mellitus without complication ([place])   . Diverticulosis   . GERD (gastroesophageal reflux disease)   . History of chicken pox   . Hyperlipidemia   . Hypertension   . Personal history of chemotherapy 2000   Rt. Breast  . Personal history of radiation therapy 2000   Rt. breast  . UTI (urinary tract infection)     Patient Active Problem List   Diagnosis Date Noted  . URI, acute 12/08/2020  . FH: colon cancer 08/11/2020  . Overweight (BMI 25.0-29.9) 04/03/2020  . Diverticulosis 09/17/2019  . CAD (coronary artery disease) 09/17/2019  . GERD (gastroesophageal reflux disease) 09/17/2019  . Esophageal dysmotility 09/17/2019  . Fatty liver 03/28/2019  . Gallstones 03/28/2019  . Type 2 diabetes mellitus without complication, without long-term current use of insulin ([place]) 10/11/2018  . Insomnia 06/13/2018  . HTN (hypertension) 03/16/2018  . HLD (hyperlipidemia) 03/16/2018  . History of right breast cancer 03/16/2018  . Positive Lyme disease serology 03/16/2018  . Breast cancer, right ([place]) 2000    Past Surgical History:  Procedure Laterality Date  . BREAST EXCISIONAL BIOPSY Right 2000   +  . BREAST SURGERY      2000  . WRIST SURGERY Left     OB History   No obstetric history on file.      Home Medications    Prior to Admission medications   Medication Sig Start Date End Date Taking? Authorizing Provider  sulfamethoxazole-trimethoprim (BACTRIM DS) 800-160 MG tablet Take 1 tablet by mouth 2 (two) times daily for 7 days. 12/19/20 12/26/20 Yes Xaivier Malay A, NP  Accu-Chek Softclix Lancets lancets Used to check blood sugars once per day. Softclix lancets or ask pt which she prefers 11/30/19   McLean-Scocuzza, Nino Glow, MD  amLODipine (NORVASC) 2.5 MG tablet Take 1 tablet (2.5 mg total) by mouth daily. 06/26/20   McLean-Scocuzza, Nino Glow, MD  aspirin EC 81 MG tablet Take 81 mg by mouth daily.    [provider]  blood glucose meter kit and supplies KIT Dispense based on patient and insurance preference. Use up to four times daily as directed. (FOR ICD-9 250.00, 250.01). 12/05/19   McLean-Scocuzza, Nino Glow, MD  blood glucose meter kit and supplies Dispense based on patient and insurance preference. Bid  directed. (FOR ICD-10 E10.9, E11.9). 1 year supply strips and lancets 11/19/19   McLean-Scocuzza, Nino Glow, MD  Blood Glucose Monitoring Suppl (ACCU-CHEK GUIDE) w/Device KIT 1 Device by Does not apply route daily. 12/14/19   McLean-Scocuzza, Nino Glow, MD  erythromycin ophthalmic ointment Place 1 application into both eyes at bedtime. 12/08/20   Crecencio Mc, MD  glipiZIDE (GLUCOTROL  XL) 10 MG 24 hr tablet Take 1 tablet (10 mg total) by mouth daily with breakfast. 08/06/20   McLean-Scocuzza, Nino Glow, MD  glucose blood (ACCU-CHEK GUIDE) test strip Used to check blood sugar once per day E11.9. 11/30/19   McLean-Scocuzza, Nino Glow, MD  guaiFENesin-codeine (CHERATUSSIN AC) 100-10 MG/5ML syrup Take 5 mLs by mouth 3 (three) times daily as needed for cough. 12/08/20   Crecencio Mc, MD  losartan-hydrochlorothiazide (HYZAAR) 100-25 MG tablet Take 1 tablet by mouth daily. In am 06/26/20   McLean-Scocuzza, Nino Glow, MD   Magnesium 250 MG TABS Take by mouth daily.    [provider]  meclizine (ANTIVERT) 12.5 MG tablet Take 1 tablet (12.5 mg total) by mouth 2 (two) times daily as needed for dizziness. Patient not taking: No sig reported 04/03/20   McLean-Scocuzza, Nino Glow, MD  meloxicam (MOBIC) 15 MG tablet Take 1 tablet (15 mg total) by mouth daily as needed for pain. 07/31/19   McLean-Scocuzza, Nino Glow, MD  metFORMIN (GLUCOPHAGE-XR) 500 MG 24 hr tablet Take 2 tablets (1,000 mg total) by mouth 2 (two) times daily with a meal. 06/26/20   McLean-Scocuzza, Nino Glow, MD  Multiple Vitamin (MULTI-VITAMIN DAILY PO) Take by mouth.    [provider]  Omega-3 Fatty Acids (FISH OIL PO) Take by mouth.    [provider]  predniSONE (DELTASONE) 10 MG tablet 6 tablets on Day 1 , then reduce by 1 tablet daily until gone 12/08/20   Crecencio Mc, MD  rosuvastatin (CRESTOR) 10 MG tablet Take 1 tablet (10 mg total) by mouth at bedtime. 08/06/20   McLean-Scocuzza, Nino Glow, MD  Semaglutide (RYBELSUS) 7 MG TABS Take 1 tablet by mouth daily. In 1 month 04/03/20   McLean-Scocuzza, Nino Glow, MD  Vitamin D, Cholecalciferol, 1000 units CAPS Take 2,000 Int'l Units/day by mouth.     [provider]  zolpidem (AMBIEN) 5 MG tablet Take 1 tablet (5 mg total) by mouth at bedtime as needed for sleep. 08/06/20   McLean-Scocuzza, Nino Glow, MD    Family History Family History  Problem Relation Age of Onset  . Colon cancer Mother 28  . Heart disease Father   . Stroke Father   . Arthritis Brother   . Diabetes Brother   . Breast cancer Maternal Grandmother   . Heart disease Maternal Grandfather   . Arthritis Brother   . Alcohol abuse Son   . Hypertension Son   . Esophageal cancer Neg Hx   . Stomach cancer Neg Hx   . Liver disease Neg Hx     Social History Social History   Tobacco Use  . Smoking status: Never Smoker  . Smokeless tobacco: Never Used  Substance Use Topics  . Alcohol use: Yes    Comment: 2  drinks per month  . Drug use: Not Currently     Allergies   Lisinopril   Review of Systems Review of Systems   Physical Exam Triage Vital Signs ED Triage Vitals  Enc Vitals Group     BP 12/19/20 0953 (!) 158/87     Pulse Rate 12/19/20 0953 (!) 117     Resp 12/19/20 0953 18     Temp 12/19/20 0953 98.4 F (36.9 C)     Temp Source 12/19/20 0953 Oral     SpO2 12/19/20 0953 95 %     Weight 12/19/20 0956 165 lb (74.8 kg)     Height --      Head Circumference --  Peak Flow --      Pain Score 12/19/20 0952 7     Pain Loc --      Pain Edu? --      Excl. in [place]? --    No data found.  Updated Vital Signs BP (!) 158/87 (BP Location: Left Arm)   Pulse 100 Comment: assessed by provider  Temp 98.4 F (36.9 C) (Oral)   Resp 18   Wt 165 lb (74.8 kg)   SpO2 95%   BMI 25.84 kg/m   Visual Acuity Right Eye Distance:   Left Eye Distance:   Bilateral Distance:    Right Eye Near:   Left Eye Near:    Bilateral Near:     Physical Exam Vitals and nursing note reviewed.  Constitutional:      General: She is not in acute distress.    Appearance: Normal appearance. She is not ill-appearing, toxic-appearing or diaphoretic.  HENT:     Head: Normocephalic.  Eyes:     Conjunctiva/sclera: Conjunctivae normal.  Pulmonary:     Effort: Pulmonary effort is normal.  Musculoskeletal:        General: Normal range of motion.     Cervical back: Normal range of motion.  Skin:    General: Skin is warm and dry.     Findings: No rash.  Neurological:     Mental Status: She is alert.  Psychiatric:        Mood and Affect: Mood normal.      UC Treatments / Results  Labs (all labs ordered are listed, but only abnormal results are displayed) Labs Reviewed  POCT URINALYSIS DIP (MANUAL ENTRY) - Abnormal; Notable for the following components:      Result Value   Clarity, UA cloudy (*)    Blood, UA moderate (*)    Protein Ur, POC =100 (*)    Nitrite, UA Positive (*)    Leukocytes,  UA Large (3+) (*)    All other components within normal limits  URINE CULTURE  CERVICOVAGINAL ANCILLARY ONLY    EKG   Radiology No results found.  Procedures Procedures (including critical care time)  Medications Ordered in UC Medications - No data to display  Initial Impression / Assessment and Plan / UC Course  I have reviewed the triage vital signs and the nursing notes.  Pertinent labs & imaging results that were available during my care of the patient were reviewed by me and considered in my medical decision making (see chart for details).     Acute cystitis with hematuria.  Treating with Bactrim.  Recommended push fluids. Culture pending. Follow up as needed for continued or worsening symptoms  Final Clinical Impressions(s) / UC Diagnoses   Final diagnoses:  Acute cystitis with hematuria     Discharge Instructions     Treating you for a urinary tract infection Take the antibiotics as prescribed Increase fluids  Follow up as needed for continued or worsening symptoms     ED Prescriptions    Medication Sig Dispense Auth. Provider   sulfamethoxazole-trimethoprim (BACTRIM DS) 800-160 MG tablet Take 1 tablet by mouth 2 (two) times daily for 7 days. 14 tablet Tamura Lasky A, NP     PDMP not reviewed this encounter.   Orvan July, NP 12/20/20 [phone]

## 2020-12-22 LAB — CERVICOVAGINAL ANCILLARY ONLY
Bacterial Vaginitis (gardnerella): POSITIVE — AB
Candida Glabrata: NEGATIVE
Candida Vaginitis: NEGATIVE
Chlamydia: NEGATIVE
Comment: NEGATIVE
Comment: NEGATIVE
Comment: NEGATIVE
Comment: NEGATIVE
Comment: NEGATIVE
Comment: NORMAL
Neisseria Gonorrhea: NEGATIVE
Trichomonas: NEGATIVE

## 2020-12-22 LAB — URINE CULTURE: Culture: >=100000 — AB

## 2020-12-23 ENCOUNTER — Telehealth (HOSPITAL_COMMUNITY): Payer: Self-pay | Admitting: Emergency Medicine

## 2020-12-23 MED ORDER — METRONIDAZOLE 500 MG PO TABS: 500.0000 mg | ORAL_TABLET | Freq: Two times a day (BID) | ORAL | 0 refills | Status: DC

## 2021-01-02 DIAGNOSIS — Z23 Encounter for immunization: Secondary | ICD-10-CM | POA: Diagnosis not present

## 2021-01-06 ENCOUNTER — Encounter: Payer: Self-pay | Admitting: Internal Medicine

## 2021-01-06 ENCOUNTER — Other Ambulatory Visit: Payer: Self-pay

## 2021-01-06 ENCOUNTER — Ambulatory Visit (INDEPENDENT_AMBULATORY_CARE_PROVIDER_SITE_OTHER): Payer: Medicare Other | Admitting: Internal Medicine

## 2021-01-06 VITALS — BP 142/88 | HR 104 | Temp 98.0°F | Ht 67.0 in | Wt 161.0 lb

## 2021-01-06 DIAGNOSIS — Z1231 Encounter for screening mammogram for malignant neoplasm of breast: Secondary | ICD-10-CM | POA: Diagnosis not present

## 2021-01-06 DIAGNOSIS — I152 Hypertension secondary to endocrine disorders: Secondary | ICD-10-CM | POA: Diagnosis not present

## 2021-01-06 DIAGNOSIS — E1159 Type 2 diabetes mellitus with other circulatory complications: Secondary | ICD-10-CM | POA: Diagnosis not present

## 2021-01-06 DIAGNOSIS — Z1329 Encounter for screening for other suspected endocrine disorder: Secondary | ICD-10-CM

## 2021-01-06 DIAGNOSIS — C50911 Malignant neoplasm of unspecified site of right female breast: Secondary | ICD-10-CM | POA: Diagnosis not present

## 2021-01-06 DIAGNOSIS — N3000 Acute cystitis without hematuria: Secondary | ICD-10-CM | POA: Diagnosis not present

## 2021-01-06 NOTE — Progress Notes (Signed)
Chief Complaint  Patient presents with  . Follow-up    5 mo f/u   F/u  1. HTN with DM 2 sugars better on rybelsus 7 mg qd from 200 in the am to 150s. Also on norvasc 2.5 mg qd she states BP normal at time just elevated in MD office on glipizide xl 10 mg, losartan hctz 100-25 and metformin xr 1000 mg bid and crestor 10 mg qhs 2. S/p tx for UTI 12/19/20 with bactrim x 1 week then tx'ed for BV with flagyl feeling better, virtual visit 12/08/20 in our clinic for URI doing better off prednisone, cheratussin, E mycin oint for conjunctivitis   Review of Systems  Constitutional: Negative for weight loss.  Eyes: Negative for blurred vision.  Respiratory: Negative for cough.   Cardiovascular: Negative for chest pain.  Gastrointestinal: Negative for abdominal pain.  Musculoskeletal: Negative for falls and joint pain.  Skin: Negative for rash.  Neurological: Negative for headaches.  Psychiatric/Behavioral: Negative for memory loss.   Past Medical History:  Diagnosis Date  . Arthritis   . Bacterial vaginosis   . Breast cancer, right ([place]) 2000    lumpectomy 8 rounds chemo, 6 weeks radiation mammo 02/24/17 negative on Arimedex x 7 years   . Cataract   . Diabetes mellitus without complication ([place])   . Diverticulosis   . GERD (gastroesophageal reflux disease)   . History of chicken pox   . Hyperlipidemia   . Hypertension   . Personal history of chemotherapy 2000   Rt. Breast  . Personal history of radiation therapy 2000   Rt. breast  . UTI (urinary tract infection)   . UTI (urinary tract infection)    e coli 12/2020    Past Surgical History:  Procedure Laterality Date  . BREAST EXCISIONAL BIOPSY Right 2000   +  . BREAST SURGERY     2000  . WRIST SURGERY Left    Family History  Problem Relation Age of Onset  . Colon cancer Mother 74  . Heart disease Father   . Stroke Father   . Arthritis Brother   . Diabetes Brother   . Breast cancer Maternal Grandmother   . Heart disease Maternal  Grandfather   . Arthritis Brother   . Alcohol abuse Son   . Hypertension Son   . Esophageal cancer Neg Hx   . Stomach cancer Neg Hx   . Liver disease Neg Hx    Social History   Socioeconomic History  . Marital status: Married    Spouse name: Not on file  . Number of children: Not on file  . Years of education: Not on file  . Highest education level: Not on file  Occupational History  . Not on file  Tobacco Use  . Smoking status: Never Smoker  . Smokeless tobacco: Never Used  Substance and Sexual Activity  . Alcohol use: Yes    Comment: 2 drinks per month  . Drug use: Not Currently  . Sexual activity: Yes    Partners: Male  Other Topics Concern  . 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 Accu-Chek Softclix Lancets lancets Used to check blood sugars once per day. Softclix lancets or ask pt which she prefers  . amLODipine (NORVASC) 2.5 MG tablet Take 1 tablet (2.5 mg total) by mouth daily.  Marland Kitchen aspirin EC 81 MG tablet Take 81 mg by mouth daily.  . blood glucose meter kit and supplies KIT Dispense based on patient and insurance preference. Use up to four times daily as directed. (FOR ICD-9 250.00, 250.01).  . blood glucose meter kit and supplies Dispense based on patient and insurance preference. Bid  directed. (FOR ICD-10 E10.9, E11.9). 1 year supply strips and lancets  . Blood Glucose Monitoring Suppl (ACCU-CHEK  GUIDE) w/Device KIT 1 Device by Does not apply route daily.  Marland Kitchen glipiZIDE (GLUCOTROL XL) 10 MG 24 hr tablet Take 1 tablet (10 mg total) by mouth daily with breakfast.  . glucose blood (ACCU-CHEK GUIDE) test strip Used to check blood sugar once per day E11.9.  . losartan-hydrochlorothiazide (HYZAAR) 100-25 MG tablet Take 1 tablet by mouth daily. In am  . Magnesium 250 MG TABS Take by mouth daily.  . meclizine (ANTIVERT) 12.5 MG tablet Take 1 tablet (12.5 mg total) by mouth 2 (two) times daily as needed for dizziness.  . meloxicam (MOBIC) 15 MG tablet Take 1 tablet (15 mg total) by mouth daily as needed for pain.  . metFORMIN (GLUCOPHAGE-XR) 500 MG 24 hr tablet Take 2 tablets (1,000 mg total) by mouth 2 (two) times daily with a meal.  . Multiple Vitamin (MULTI-VITAMIN DAILY PO) Take by mouth.  . Omega-3 Fatty Acids (FISH OIL PO) Take by mouth.  . rosuvastatin (CRESTOR) 10 MG tablet Take 1 tablet (10 mg total) by mouth at bedtime.  . Semaglutide (RYBELSUS) 7 MG TABS Take 1 tablet by mouth daily. In 1 month  . Vitamin D, Cholecalciferol, 1000 units CAPS Take 2,000 Int'l Units/day by mouth.   . zolpidem (AMBIEN) 5 MG tablet Take 1 tablet (5 mg total) by mouth at bedtime as needed for sleep.  . [DISCONTINUED] erythromycin ophthalmic ointment Place 1 application into both eyes at bedtime.  . [DISCONTINUED] guaiFENesin-codeine (CHERATUSSIN AC) 100-10 MG/5ML syrup Take 5 mLs by mouth 3 (three) times daily as needed for cough.  . [DISCONTINUED] metroNIDAZOLE (FLAGYL) 500 MG tablet Take 1 tablet (500 mg total) by mouth 2 (two) times daily.   Current Facility-Administered Medications for the 01/06/21 encounter (Office Visit) with McLean-Scocuzza, Nino Glow, MD  Medication  . 0.9 %  sodium chloride infusion   Allergies  Allergen Reactions  . Lisinopril     Cough    Recent Results (from the past 2160 hour(s))  POCT urinalysis dipstick     Status: Abnormal   Collection Time: 12/19/20 10:05 AM  Result  Value Ref Range   Color, UA yellow yellow   Clarity, UA cloudy (A) clear   Glucose, UA negative negative mg/dL   Bilirubin, UA negative negative   Ketones, POC UA negative negative mg/dL   Spec Grav, UA 1.020 1.010 - 1.025   Blood, UA moderate (A) negative   pH, UA 6.5 5.0 - 8.0   Protein Ur, POC =100 (A) negative mg/dL   Urobilinogen, UA 0.2  0.2 or 1.0 E.U./dL   Nitrite, UA Positive (A) Negative   Leukocytes, UA Large (3+) (A) Negative  Urine Culture     Status: Abnormal   Collection Time: 12/19/20 10:08 AM   Specimen: Urine, Random  Result Value Ref Range   Specimen Description URINE, RANDOM    Special Requests      NONE Performed at [facility], [place] [address], [place], [place] [address]    Culture >=100,000 COLONIES/mL ESCHERICHIA COLI (A)    Report Status 12/22/2020 FINAL    Organism ID, Bacteria ESCHERICHIA COLI (A)       Susceptibility   Escherichia coli - MIC*    AMPICILLIN 4 SENSITIVE Sensitive     CEFAZOLIN <=4 SENSITIVE Sensitive     CEFEPIME <=0.12 SENSITIVE Sensitive     CEFTRIAXONE <=0.25 SENSITIVE Sensitive     CIPROFLOXACIN <=0.25 SENSITIVE Sensitive     GENTAMICIN <=1 SENSITIVE Sensitive     IMIPENEM <=0.25 SENSITIVE Sensitive     NITROFURANTOIN <=16 SENSITIVE Sensitive     TRIMETH/SULFA <=20 SENSITIVE Sensitive     AMPICILLIN/SULBACTAM <=2 SENSITIVE Sensitive     PIP/TAZO <=4 SENSITIVE Sensitive     * >=100,000 COLONIES/mL ESCHERICHIA COLI  Cervicovaginal ancillary only     Status: Abnormal   Collection Time: 12/19/20 10:14 AM  Result Value Ref Range   Neisseria Gonorrhea Negative    Chlamydia Negative    Trichomonas Negative    Bacterial Vaginitis (gardnerella) Positive (A)    Candida Vaginitis Negative    Candida Glabrata Negative    Comment      Normal Reference Range Bacterial Vaginosis - Negative   Comment Normal Reference Range Candida Species - Negative    Comment Normal Reference Range Candida Galbrata - Negative    Comment Normal  Reference Range Trichomonas - Negative    Comment Normal Reference Ranger Chlamydia - Negative    Comment      Normal Reference Range Neisseria Gonorrhea - Negative   Objective  Body mass index is 25.22 kg/m. Wt Readings from Last 3 Encounters:  01/06/21 161 lb (73 kg)  12/19/20 165 lb (74.8 kg)  12/08/20 160 lb (72.6 kg)   Temp Readings from Last 3 Encounters:  01/06/21 98 F (36.7 C)  12/19/20 98.4 F (36.9 C) (Oral)  11/10/20 (!) 97 F (36.1 C)   BP Readings from Last 3 Encounters:  01/06/21 (!) 142/88  12/19/20 (!) 158/87  12/08/20 127/74   Pulse Readings from Last 3 Encounters:  01/06/21 (!) 104  12/19/20 100  12/08/20 83    Physical Exam Vitals and nursing note reviewed.  Constitutional:      Appearance: Normal appearance. She is well-developed, well-groomed and overweight.  HENT:     Head: Normocephalic and atraumatic.  Cardiovascular:     Rate and Rhythm: Normal rate and regular rhythm.     Heart sounds: Normal heart sounds. No murmur heard.   Pulmonary:     Effort: Pulmonary effort is normal.     Breath sounds: Normal breath sounds.  Skin:    General: Skin is warm and dry.  Neurological:     General: No focal deficit present.     Mental Status: She is alert and oriented to person, place, and time. Mental status is at baseline.     Gait: Gait normal.  Psychiatric:        Attention and Perception: Attention and perception normal.        Mood and Affect: Mood and  affect normal.        Speech: Speech normal.        Behavior: Behavior normal. Behavior is cooperative.        Thought Content: Thought content normal.        Cognition and Memory: Cognition and memory normal.        Judgment: Judgment normal.     Assessment  Plan  Hypertension associated with diabetes (HCC)  On Rybelsus 7 mg qd from 200 in the am to 150s. Also on norvasc 2.5 mg qd she states BP normal at time just elevated in MD office on glipizide xl 10 mg, losartan hctz 100-25 and  metformin xr 1000 mg bid and crestor 10 mg qhs Overdue for eye exam will send ROI AE see if had Fasting labs 02/08/21  F/u in 6-7 months monitor BP  Foot exam at f/u as well   HM Flu shot utd prevnar had 02/02/17 pna 23utd MMR NOT immunemumps consider vaccine covid vaccine 3/3 moderna4th dose 01/02/21 moderna 2/2 shingrix  Tdap2/4/17  hepA/Bconsider  -consider in future fatty liver  Hep C negative 12/17/16  RequestedRecords labs, vaccines,pap 2015 and colonoscopy lastPCP cant find report but reports due in 02/2020  PCPper pt had 05/2014 colonoscopy normal mom +colon cancer had another colonoscopy 11/10/20 [facility] with polyps tubular x 3 f/u in 3 years rec   DEXA 03/16/17 normal scanned in chart   Mammogram8/6/21 negative, ordered   Out of age window pap no records from prior PCP  Est. Dermatology Dr. Kellie Moor h/o [facility] seen spring 2021  F/u in 1 year  Pharmacies [facility] short  humanamail order-long term meds  Eye surgery tbd ? scheduled 11/08/19 pt moved back later date Est AE   Former pcpDolsie [facility] [place] [phone] disccolonoscopyprev report  H/o Dr. Janese Banks  Provider: Dr. Olivia Mackie McLean-Scocuzza-Internal Medicine

## 2021-01-06 NOTE — Patient Instructions (Signed)
im out of the office the week of 03/23/21 and out of the office 8/16-10/7/22

## 2021-02-09 ENCOUNTER — Other Ambulatory Visit: Payer: Medicare Other

## 2021-02-10 DIAGNOSIS — D225 Melanocytic nevi of trunk: Secondary | ICD-10-CM | POA: Diagnosis not present

## 2021-02-10 DIAGNOSIS — D2272 Melanocytic nevi of left lower limb, including hip: Secondary | ICD-10-CM | POA: Diagnosis not present

## 2021-02-10 DIAGNOSIS — L280 Lichen simplex chronicus: Secondary | ICD-10-CM | POA: Diagnosis not present

## 2021-02-10 DIAGNOSIS — L821 Other seborrheic keratosis: Secondary | ICD-10-CM | POA: Diagnosis not present

## 2021-02-10 DIAGNOSIS — D2262 Melanocytic nevi of left upper limb, including shoulder: Secondary | ICD-10-CM | POA: Diagnosis not present

## 2021-02-10 DIAGNOSIS — D2261 Melanocytic nevi of right upper limb, including shoulder: Secondary | ICD-10-CM | POA: Diagnosis not present

## 2021-02-10 DIAGNOSIS — D2271 Melanocytic nevi of right lower limb, including hip: Secondary | ICD-10-CM | POA: Diagnosis not present

## 2021-02-16 ENCOUNTER — Other Ambulatory Visit: Payer: Self-pay

## 2021-02-16 ENCOUNTER — Other Ambulatory Visit (INDEPENDENT_AMBULATORY_CARE_PROVIDER_SITE_OTHER): Payer: Medicare Other

## 2021-02-16 DIAGNOSIS — N3000 Acute cystitis without hematuria: Secondary | ICD-10-CM

## 2021-02-16 DIAGNOSIS — Z1329 Encounter for screening for other suspected endocrine disorder: Secondary | ICD-10-CM

## 2021-02-16 DIAGNOSIS — E1159 Type 2 diabetes mellitus with other circulatory complications: Secondary | ICD-10-CM | POA: Diagnosis not present

## 2021-02-16 DIAGNOSIS — I152 Hypertension secondary to endocrine disorders: Secondary | ICD-10-CM | POA: Diagnosis not present

## 2021-02-16 LAB — CBC WITH DIFFERENTIAL/PLATELET
Basophils Absolute: 0 10*3/uL (ref 0.0–0.1)
Basophils Relative: 0.3 % (ref 0.0–3.0)
Eosinophils Absolute: 0.2 10*3/uL (ref 0.0–0.7)
Eosinophils Relative: 2.7 % (ref 0.0–5.0)
HCT: 37.9 % (ref 36.0–46.0)
Hemoglobin: 13.1 g/dL (ref 12.0–15.0)
Lymphocytes Relative: 49.6 % — ABNORMAL HIGH (ref 12.0–46.0)
Lymphs Abs: 3.7 10*3/uL (ref 0.7–4.0)
MCHC: 34.6 g/dL (ref 30.0–36.0)
MCV: 81.9 fl (ref 78.0–100.0)
Monocytes Absolute: 0.7 10*3/uL (ref 0.1–1.0)
Monocytes Relative: 9.1 % (ref 3.0–12.0)
Neutro Abs: 2.8 10*3/uL (ref 1.4–7.7)
Neutrophils Relative %: 38.3 % — ABNORMAL LOW (ref 43.0–77.0)
Platelets: 254 10*3/uL (ref 150.0–400.0)
RBC: 4.63 Mil/uL (ref 3.87–5.11)
RDW: 14.3 % (ref 11.5–15.5)
WBC: 7.4 10*3/uL (ref 4.0–10.5)

## 2021-02-16 LAB — LIPID PANEL
Cholesterol: 114 mg/dL (ref 0–200)
HDL: 34.2 mg/dL — ABNORMAL LOW (ref >39.00)
LDL Cholesterol: 46 mg/dL (ref 0–99)
NonHDL: 79.6
Total CHOL/HDL Ratio: 3
Triglycerides: 167 mg/dL — ABNORMAL HIGH (ref 0.0–149.0)
VLDL: 33.4 mg/dL (ref 0.0–40.0)

## 2021-02-16 LAB — COMPREHENSIVE METABOLIC PANEL
ALT: 31 U/L (ref 0–35)
AST: 21 U/L (ref 0–37)
Albumin: 4.7 g/dL (ref 3.5–5.2)
Alkaline Phosphatase: 39 U/L (ref 39–117)
BUN: 16 mg/dL (ref 6–23)
CO2: 27 mEq/L (ref 19–32)
Calcium: 9.7 mg/dL (ref 8.4–10.5)
Chloride: 100 mEq/L (ref 96–112)
Creatinine, Ser: 0.63 mg/dL (ref 0.40–1.20)
GFR: 90.1 mL/min (ref >60.00)
Glucose, Bld: 150 mg/dL — ABNORMAL HIGH (ref 70–99)
Potassium: 4.2 mEq/L (ref 3.5–5.1)
Sodium: 138 mEq/L (ref 135–145)
Total Bilirubin: 0.5 mg/dL (ref 0.2–1.2)
Total Protein: 7.4 g/dL (ref 6.0–8.3)

## 2021-02-16 LAB — HEMOGLOBIN A1C: Hgb A1c MFr Bld: 6.6 % — ABNORMAL HIGH (ref 4.6–6.5)

## 2021-02-16 LAB — TSH: TSH: 3.35 u[IU]/mL (ref 0.35–4.50)

## 2021-02-17 LAB — URINALYSIS, ROUTINE W REFLEX MICROSCOPIC
Bilirubin, UA: NEGATIVE
Glucose, UA: NEGATIVE
Ketones, UA: NEGATIVE
Leukocytes,UA: NEGATIVE
Nitrite, UA: NEGATIVE
Protein,UA: NEGATIVE
RBC, UA: NEGATIVE
Specific Gravity, UA: 1.014 (ref 1.005–1.030)
Urobilinogen, Ur: 0.2 mg/dL (ref 0.2–1.0)
pH, UA: 5 (ref 5.0–7.5)

## 2021-02-18 LAB — URINE CULTURE

## 2021-02-27 ENCOUNTER — Other Ambulatory Visit: Payer: Self-pay | Admitting: Internal Medicine

## 2021-02-27 DIAGNOSIS — I10 Essential (primary) hypertension: Secondary | ICD-10-CM

## 2021-02-27 DIAGNOSIS — E1159 Type 2 diabetes mellitus with other circulatory complications: Secondary | ICD-10-CM

## 2021-02-27 DIAGNOSIS — G4700 Insomnia, unspecified: Secondary | ICD-10-CM

## 2021-02-27 MED ORDER — AMLODIPINE BESYLATE 2.5 MG PO TABS: 2.5000 mg | ORAL_TABLET | Freq: Every day | ORAL | 3 refills | Status: DC

## 2021-02-27 MED ORDER — ZOLPIDEM TARTRATE 5 MG PO TABS
5.0000 mg | ORAL_TABLET | Freq: Every evening | ORAL | 1 refills | Status: DC | PRN
Start: 2021-02-27 — End: 2021-09-18

## 2021-02-27 MED ORDER — LOSARTAN POTASSIUM-HCTZ 100-25 MG PO TABS: 1.0000 | ORAL_TABLET | Freq: Every day | ORAL | 3 refills | Status: DC

## 2021-02-27 MED ORDER — METFORMIN HCL ER 500 MG PO TB24: 1000.0000 mg | ORAL_TABLET | Freq: Two times a day (BID) | ORAL | 3 refills | Status: DC

## 2021-03-11 ENCOUNTER — Encounter: Payer: Self-pay | Admitting: Internal Medicine

## 2021-03-11 DIAGNOSIS — E119 Type 2 diabetes mellitus without complications: Secondary | ICD-10-CM

## 2021-03-11 DIAGNOSIS — E1159 Type 2 diabetes mellitus with other circulatory complications: Secondary | ICD-10-CM

## 2021-03-12 MED ORDER — RYBELSUS 7 MG PO TABS: 1.0000 | ORAL_TABLET | Freq: Every day | ORAL | 3 refills | Status: DC

## 2021-04-14 ENCOUNTER — Encounter: Payer: Self-pay | Admitting: Internal Medicine

## 2021-04-14 NOTE — Telephone Encounter (Signed)
Called and Patient is scheduled for 1:30 appointment with Dr Olivia Mackie McLean-Scocuzza tomorrow virtual.   Husband was scheduled to see Joycelyn Schmid at 1:30 tomorrow virtually.   Both Patient's would like the links emailed to them .

## 2021-04-15 ENCOUNTER — Encounter: Payer: Self-pay | Admitting: Internal Medicine

## 2021-04-15 ENCOUNTER — Other Ambulatory Visit: Payer: Self-pay

## 2021-04-15 ENCOUNTER — Telehealth (INDEPENDENT_AMBULATORY_CARE_PROVIDER_SITE_OTHER): Payer: Medicare Other | Admitting: Internal Medicine

## 2021-04-15 VITALS — BP 130/76 | Ht 67.0 in | Wt 159.4 lb

## 2021-04-15 DIAGNOSIS — U071 COVID-19: Secondary | ICD-10-CM | POA: Insufficient documentation

## 2021-04-15 MED ORDER — MOLNUPIRAVIR EUA 200MG CAPSULE: 4.0000 | ORAL_CAPSULE | Freq: Two times a day (BID) | ORAL | 0 refills | Status: AC

## 2021-04-15 NOTE — Progress Notes (Signed)
Telephone Note  I connected with Christy Stout   on 04/15/21 at  1:30 PM EDT by telephone and verified that I am speaking with the correct person using two identifiers.  Location patient: home, [place] Location provider:work or home office Persons participating in the virtual visit: patient, provider  I discussed the limitations of evaluation and management by telemedicine and the availability of in person appointments. The patient expressed understanding and agreed to proceed.   HPI:  Acute telemedicine visit for : Family trip to [facility] and family form [place] and [place] in [place] sx's started Saturday am but feeling 100% better today had cough but resolved and had chest congestion _0 00   WRIST SURGERY Left      Current Outpatient Medications:    Accu-Chek Softclix Lancets lancets, Used to check blood sugars once per day. Softclix lancets or ask pt which she prefers, Disp: 100 each, Rfl: 4   amLODipine (NORVASC) 2.5 MG tablet, Take 1 tablet (2.5 mg total) by mouth daily., Disp: 90 tablet,  Rfl: 3   aspirin EC 81 MG tablet, Take 81 mg by mouth daily., Disp: , Rfl:    blood glucose meter kit and supplies KIT, Dispense based on patient and insurance preference. Use up to four times daily as directed. (FOR ICD-9 250.00, 250.01)., Disp: 1 each, Rfl: 0   blood glucose meter kit and supplies, Dispense based on patient and insurance preference. Bid  directed. (FOR ICD-10 E10.9, E11.9). 1 year supply strips and lancets, Disp: 1 each, Rfl: 0   Blood Glucose Monitoring Suppl (ACCU-CHEK GUIDE) w/Device KIT, 1 Device by Does not apply route daily., Disp: 1 kit, Rfl: 0   glipiZIDE (GLUCOTROL XL) 10 MG 24 hr tablet, Take 1 tablet (10 mg total) by mouth daily with breakfast., Disp: 90 tablet, Rfl: 3   glucose blood (ACCU-CHEK GUIDE) test strip, Used to check blood sugar once per day E11.9., Disp: 100 each, Rfl: 5   losartan-hydrochlorothiazide (HYZAAR) 100-25 MG tablet, Take 1 tablet by mouth daily. In am, Disp: 90 tablet, Rfl: 3   Magnesium 250 MG TABS, Take by mouth daily., Disp: , Rfl:    meloxicam (MOBIC) 15 MG tablet, Take 1 tablet (15 mg total) by mouth daily as needed for pain., Disp: 90 tablet, Rfl: 3   metFORMIN (GLUCOPHAGE-XR) 500 MG 24 hr tablet, Take 2 tablets (1,000 mg total) by mouth 2 (two) times  daily with a meal., Disp: 360 tablet, Rfl: 3   molnupiravir EUA 200 mg CAPS, Take 4 capsules (800 mg total) by mouth 2 (two) times daily for 5 days. With food, Disp: 40 capsule, Rfl: 0   Multiple Vitamin (MULTI-VITAMIN DAILY PO), Take by mouth., Disp: , Rfl:    Omega-3 Fatty Acids (FISH OIL PO), Take by mouth., Disp: , Rfl:    rosuvastatin (CRESTOR) 10 MG tablet, Take 1 tablet (10 mg total) by mouth at bedtime., Disp: 90 tablet, Rfl: 3   Semaglutide (RYBELSUS) 7 MG TABS, Take 1 tablet by mouth daily. In 1 month, Disp: 90 tablet, Rfl: 3   Vitamin D, Cholecalciferol, 1000 units CAPS, Take 2,000 Int'l Units/day by mouth. , Disp: , Rfl:    zolpidem (AMBIEN) 5 MG tablet, Take 1 tablet (5 mg total)  by mouth at bedtime as needed for sleep., Disp: 90 tablet, Rfl: 1   meclizine (ANTIVERT) 12.5 MG tablet, Take 1 tablet (12.5 mg total) by mouth 2 (two) times daily as needed for dizziness. (Patient not taking: Reported on 04/15/2021), Disp: 60 tablet, Rfl: 0  Current Facility-Administered Medications:    0.9 %  sodium chloride infusion, 500 mL, Intravenous, Once, Danis, Kirke Corin, MD  EXAM:  VITALS per patient if applicable:  GENERAL: alert, oriented, appears well and in no acute distress  Lungs: cough on exam dry cough   PSYCH/NEURO: pleasant and cooperative, no obvious depression or anxiety, speech and thought processing grossly intact  ASSESSMENT AND PLAN:  Discussed the following assessment and plan:  COVID-19 - Plan: molnupiravir EUA 800 mg CAPS mg bid x 5 days  Mucinex dm green label for cough.  Vitamin C 1000 mg daily.  Vitamin D3 4000 Iu (units) daily.  Zinc 100 mg daily.  Quercetin 250-500 mg 2 times per day   Elderberry  Oil of oregano  cepacol or chloroseptic spray  Warm tea with honey and lemon  Hydration  Try to eat though you dont feel like it   Tylenol or Advil  Nasal saline and Flonase 2 sprays  Otc antihistamine prn     Monitor pulse oximeter, buy from [facility] if oxygen is less than 90 please go to the hospital.        Are you feeling really sick? Shortness of breath, cough, chest pain?, dizziness? Confusion   If so let me know  If worsening, go to hospital or [facility] clinic Urgent care for further treatment.   -we discussed possible serious and likely etiologies, options for evaluation and workup, limitations of telemedicine visit vs in person visit, treatment, treatment risks and precautions. Pt prefers to treat via telemedicine empirically rather than in person at this moment.      I discussed the assessment and treatment plan with the patient. The patient was provided an opportunity to ask questions and all were answered. The patient agreed with the  plan and demonstrated an understanding of the instructions.    Time 20 min Delorise Jackson, MD

## 2021-04-22 ENCOUNTER — Ambulatory Visit: Payer: Medicare Other

## 2021-05-11 ENCOUNTER — Other Ambulatory Visit: Payer: Self-pay

## 2021-05-11 ENCOUNTER — Ambulatory Visit
Admission: RE | Admit: 2021-05-11 | Discharge: 2021-05-11 | Disposition: A | Discharge status: Home / Self Care | Payer: Medicare Other | Source: Ambulatory Visit | Attending: Internal Medicine | Admitting: Internal Medicine

## 2021-05-11 DIAGNOSIS — Z1231 Encounter for screening mammogram for malignant neoplasm of breast: Secondary | ICD-10-CM | POA: Diagnosis not present

## 2021-05-14 ENCOUNTER — Encounter: Payer: Self-pay | Admitting: Internal Medicine

## 2021-06-01 ENCOUNTER — Ambulatory Visit: Payer: Medicare Other

## 2021-06-19 ENCOUNTER — Other Ambulatory Visit: Payer: Self-pay

## 2021-06-19 ENCOUNTER — Encounter: Payer: Self-pay | Admitting: Internal Medicine

## 2021-06-23 ENCOUNTER — Other Ambulatory Visit: Payer: Self-pay

## 2021-06-23 DIAGNOSIS — E1165 Type 2 diabetes mellitus with hyperglycemia: Secondary | ICD-10-CM

## 2021-06-23 MED ORDER — GLIPIZIDE ER 10 MG PO TB24: 10.0000 mg | ORAL_TABLET | Freq: Every day | ORAL | 3 refills | Status: DC

## 2021-06-23 MED ORDER — ROSUVASTATIN CALCIUM 10 MG PO TABS: 10.0000 mg | ORAL_TABLET | Freq: Every day | ORAL | 3 refills | Status: DC

## 2021-06-24 ENCOUNTER — Other Ambulatory Visit: Payer: Self-pay

## 2021-06-24 ENCOUNTER — Ambulatory Visit (INDEPENDENT_AMBULATORY_CARE_PROVIDER_SITE_OTHER): Payer: Medicare Other

## 2021-06-24 DIAGNOSIS — Z23 Encounter for immunization: Secondary | ICD-10-CM

## 2021-07-08 ENCOUNTER — Other Ambulatory Visit: Payer: Medicare Other

## 2021-07-10 DIAGNOSIS — Z23 Encounter for immunization: Secondary | ICD-10-CM | POA: Diagnosis not present

## 2021-07-15 ENCOUNTER — Ambulatory Visit: Payer: Medicare Other | Admitting: Internal Medicine

## 2021-08-06 ENCOUNTER — Ambulatory Visit (INDEPENDENT_AMBULATORY_CARE_PROVIDER_SITE_OTHER): Payer: Medicare Other

## 2021-08-06 ENCOUNTER — Other Ambulatory Visit: Payer: Self-pay

## 2021-08-06 ENCOUNTER — Encounter: Payer: Self-pay | Admitting: Internal Medicine

## 2021-08-06 ENCOUNTER — Ambulatory Visit (INDEPENDENT_AMBULATORY_CARE_PROVIDER_SITE_OTHER): Payer: Medicare Other | Admitting: Internal Medicine

## 2021-08-06 VITALS — BP 130/72 | HR 95 | Temp 98.1°F | Ht 67.0 in | Wt 157.4 lb

## 2021-08-06 DIAGNOSIS — M25561 Pain in right knee: Secondary | ICD-10-CM | POA: Diagnosis not present

## 2021-08-06 DIAGNOSIS — E1159 Type 2 diabetes mellitus with other circulatory complications: Secondary | ICD-10-CM | POA: Diagnosis not present

## 2021-08-06 DIAGNOSIS — M25511 Pain in right shoulder: Secondary | ICD-10-CM | POA: Diagnosis not present

## 2021-08-06 DIAGNOSIS — M25551 Pain in right hip: Secondary | ICD-10-CM

## 2021-08-06 DIAGNOSIS — M47815 Spondylosis without myelopathy or radiculopathy, thoracolumbar region: Secondary | ICD-10-CM | POA: Diagnosis not present

## 2021-08-06 DIAGNOSIS — M1611 Unilateral primary osteoarthritis, right hip: Secondary | ICD-10-CM | POA: Diagnosis not present

## 2021-08-06 DIAGNOSIS — M11261 Other chondrocalcinosis, right knee: Secondary | ICD-10-CM | POA: Diagnosis not present

## 2021-08-06 DIAGNOSIS — M542 Cervicalgia: Secondary | ICD-10-CM

## 2021-08-06 DIAGNOSIS — I152 Hypertension secondary to endocrine disorders: Secondary | ICD-10-CM

## 2021-08-06 MED ORDER — ACCU-CHEK GUIDE VI STRP: ORAL_STRIP | 5 refills | Status: DC

## 2021-08-06 NOTE — Progress Notes (Addendum)
Chief Complaint  Patient presents with   Follow-up   F/u  1. C/o right sided pain in neck, shoulder, hip and knee chronic and intermittent x years and knee s/p surgery with reduced ROM pain worse at night and she had surgery in the 1980s on right knee prior CT scan + thoracolumbar spondylosis 2. Htn controlled and Dm 2 cbgs (checking at least daily) in the am improved on rybelsus 7 mg qd 02/16/21 A1C 6.6 also on glipizide 10 mg xl qd metformin xr 500 and hyzaar 100-25 mg qd for htn and crestor 10 mg qhs  02/16/21 07:43 Glucose: 150 (H) Hemoglobin A1C: 6.6 (H)  08/25/21 09:07 Glucose: 136 (H) Hemoglobin A1C: 6.7 (H)   (H): Data is abnormally high Needs Rx strips checking qd  She needs to call for eye exam   Review of Systems  Constitutional:  Negative for weight loss.  HENT:  Negative for hearing loss.   Eyes:  Negative for blurred vision.  Respiratory:  Negative for shortness of breath.   Cardiovascular:  Negative for chest pain.  Gastrointestinal:  Negative for abdominal pain.  Musculoskeletal:  Positive for back pain, joint pain and neck pain. Negative for falls.  Skin:  Negative for rash.  Neurological:  Negative for headaches.  Psychiatric/Behavioral:  Negative for depression.   Past Medical History:  Diagnosis Date   Arthritis    Bacterial vaginosis    Breast cancer, right (HCC) 2000    lumpectomy 8 rounds chemo, 6 weeks radiation mammo 02/24/17 negative on Arimedex x 7 years    Cataract    COVID-19    04/13/21   Diabetes mellitus without complication (HCC)    Diverticulosis    GERD (gastroesophageal reflux disease)    History of chicken pox    Hyperlipidemia    Hypertension    Personal history of chemotherapy 2000   Rt. Breast   Personal history of radiation therapy 2000   Rt. breast   UTI (urinary tract infection)    UTI (urinary tract infection)    e coli 12/2020    Past Surgical History:  Procedure Laterality Date   BREAST EXCISIONAL BIOPSY Right 2000   +    BREAST SURGERY     2000   WRIST SURGERY Left    Family History  Problem Relation Age of Onset   Colon cancer Mother 67   Heart disease Father    Stroke Father    Arthritis Brother    Diabetes Brother    Breast cancer Maternal Grandmother    Heart disease Maternal Grandfather    Arthritis Brother    Alcohol abuse Son    Hypertension Son    Esophageal cancer Neg Hx    Stomach cancer Neg Hx    Liver disease Neg Hx    Social History   Socioeconomic History   Marital status: Married    Spouse name: Not on file   Number of children: Not on file   Years of education: Not on file   Highest education level: Not on file  Occupational History   Not on file  Tobacco Use   Smoking status: Never   Smokeless tobacco: Never  Substance and Sexual Activity   Alcohol use: Yes    Comment: 2 drinks per month   Drug use: Not Currently   Sexual activity: Yes    Partners: Male  Other Topics Concern   Not on file  Social History Narrative   Twin sons 1 daughter      Married husband Marcello Moores    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 Softclix lancets or ask pt which she prefers   amLODipine (NORVASC) 2.5 MG tablet Take 1 tablet (2.5 mg total) by mouth daily.   aspirin EC 81 MG tablet Take 81 mg by mouth daily.   blood glucose meter kit and supplies KIT Dispense based on patient and insurance preference. Use up to four times daily as  directed. (FOR ICD-9 250.00, 250.01).   blood glucose meter kit and supplies Dispense based on patient and insurance preference. Bid  directed. (FOR ICD-10 E10.9, E11.9). 1 year supply strips and lancets   Blood Glucose Monitoring Suppl (ACCU-CHEK GUIDE) w/Device KIT 1 Device by Does not apply route daily.   glipiZIDE (GLUCOTROL XL) 10 MG 24 hr tablet Take 1 tablet (10 mg total) by mouth daily with breakfast.   losartan-hydrochlorothiazide (HYZAAR) 100-25 MG tablet Take 1 tablet by mouth daily. In am   Magnesium 250 MG TABS Take by mouth daily.   meloxicam (MOBIC) 15 MG tablet Take 1 tablet (15 mg total) by mouth daily as needed for pain.   metFORMIN (GLUCOPHAGE-XR) 500 MG 24 hr tablet Take 2 tablets (1,000 mg total) by mouth 2 (two) times daily with a meal.   Multiple Vitamin (MULTI-VITAMIN DAILY PO) Take by mouth.   Omega-3 Fatty Acids (FISH OIL PO) Take by mouth.   rosuvastatin (CRESTOR) 10 MG tablet Take 1 tablet (10 mg total) by mouth at bedtime.   Semaglutide (RYBELSUS) 7 MG TABS Take 1 tablet by mouth daily. In 1 month   Vitamin D, Cholecalciferol, 1000 units CAPS Take 2,000 Int'l Units/day by mouth.    zolpidem (AMBIEN) 5 MG tablet Take 1 tablet (5 mg total) by mouth at bedtime as needed for sleep.   [DISCONTINUED] glucose blood (ACCU-CHEK GUIDE) test strip Used to check blood sugar once per day E11.9.   Current Facility-Administered Medications for the 08/06/21 encounter (Office Visit) with McLean-Scocuzza, Nino Glow, MD  Medication   0.9 %  sodium chloride infusion   Allergies  Allergen Reactions   Lisinopril     Cough    No results found for this or any previous visit (from the past 2160 hour(s)). Objective  Body mass index is 24.65 kg/m. Wt Readings from Last 3 Encounters:  08/06/21 157 lb 6.4 oz (71.4 kg)  04/15/21 159 lb 6.4 oz (72.3 kg)  01/06/21 161 lb (73 kg)   Temp Readings from Last 3 Encounters:  08/06/21 98.1 F (36.7 C) (Oral)  01/06/21 98 F (36.7 C)   12/19/20 98.4 F (36.9 C) (Oral)   BP Readings from Last 3 Encounters:  08/06/21 130/72  04/15/21 130/76  01/06/21 (!) 142/88   Pulse Readings from Last 3 Encounters:  08/06/21 95  01/06/21 (!) 104  12/19/20 100    Physical Exam Vitals  and nursing note reviewed.  Constitutional:      Appearance: Normal appearance. She is well-developed and well-groomed.  HENT:     Head: Normocephalic and atraumatic.  Eyes:     Conjunctiva/sclera: Conjunctivae normal.     Pupils: Pupils are equal, round, and reactive to light.  Cardiovascular:     Rate and Rhythm: Normal rate and regular rhythm.     Heart sounds: Normal heart sounds. No murmur heard. Pulmonary:     Effort: Pulmonary effort is normal.     Breath sounds: Normal breath sounds.  Abdominal:     Tenderness: There is no abdominal tenderness.  Skin:    General: Skin is warm and dry.  Neurological:     General: No focal deficit present.     Mental Status: She is alert and oriented to person, place, and time. Mental status is at baseline.     Gait: Gait normal.  Psychiatric:        Attention and Perception: Attention and perception normal.        Mood and Affect: Mood and affect normal.        Speech: Speech normal.        Behavior: Behavior normal. Behavior is cooperative.        Thought Content: Thought content normal.        Cognition and Memory: Cognition and memory normal.        Judgment: Judgment normal.    Assessment  Plan  Hypertension controlled associated with diabetes ([place]) 2 controlled last A1C 6.6- Plan: Comprehensive metabolic panel, Lipid panel, Hemoglobin A1c, CBC with Differential/Platelet, Microalbumin / creatinine urine ratio, glucose blood (ACCU-CHEK GUIDE) test strip Cont meds glip xl 10 mg, hyzaar 100-25 mg qd, metformin x 500 mg qd, crestor 10 mg rybelsus 7 mg qd  Overdue eye exam call AE for appt   Cervicalgia - Plan: DG Cervical Spine Complete Acute pain of right shoulder - Plan: DG Shoulder  Right Right hip pain - Plan: DG Hip Unilat W OR W/O Pelvis 2-3 Views Right Acute pain of right knee - Plan: DG Knee Complete 4 Views Right Spondylosis of thoracolumbar spine  -consider [facility] in the future  HM Flu shot utd  prevnar had 02/02/17 pna 23 utd   MMR NOT immune mumps consider vaccine covid vaccine 5/5  moderna 4 2/2 shingrix  Tdap 11/08/15   hep A/B consider  -consider in future fatty liver not immune Hep C negative 12/17/16    PCP per pt had 05/2014 colonoscopy normal mom +colon cancer had another   colonoscopy 11/10/20 [facility] with polyps tubular x 3 f/u in 3 years rec    DEXA 03/16/17 normal scanned in chart    Mammogram 05/11/21 negative   Out of age window pap no records from prior PCP    Est. Dermatology Dr. Kellie Moor h/o [facility]  seen appt 10/2021   [facility]  [facility] -long term meds   Eye surgery tbd ? scheduled 11/08/19 pt moved back later date  [facility]    Former pcp Judithe Modest NP [facility] [phone] disc colonoscopy prev report    H/o Dr. Janese Banks    Provider: Dr. Olivia Mackie McLean-Scocuzza-Internal Medicine

## 2021-08-06 NOTE — Patient Instructions (Addendum)
Call [facility] due for an eye exam Last 08/2021 rec yearly  [phone] Not available [address]    [place] [address]      Specialties     Ophthalmology

## 2021-08-21 ENCOUNTER — Other Ambulatory Visit: Payer: Self-pay

## 2021-08-21 DIAGNOSIS — E1159 Type 2 diabetes mellitus with other circulatory complications: Secondary | ICD-10-CM

## 2021-08-21 DIAGNOSIS — I152 Hypertension secondary to endocrine disorders: Secondary | ICD-10-CM

## 2021-08-21 MED ORDER — ACCU-CHEK GUIDE VI STRP
ORAL_STRIP | 5 refills | Status: DC
Start: 2021-08-21 — End: 2021-09-03

## 2021-08-25 ENCOUNTER — Other Ambulatory Visit: Payer: Self-pay

## 2021-08-25 ENCOUNTER — Other Ambulatory Visit (INDEPENDENT_AMBULATORY_CARE_PROVIDER_SITE_OTHER): Payer: Medicare Other

## 2021-08-25 DIAGNOSIS — I152 Hypertension secondary to endocrine disorders: Secondary | ICD-10-CM

## 2021-08-25 DIAGNOSIS — E1159 Type 2 diabetes mellitus with other circulatory complications: Secondary | ICD-10-CM | POA: Diagnosis not present

## 2021-08-25 LAB — COMPREHENSIVE METABOLIC PANEL
ALT: 35 U/L (ref 0–35)
AST: 25 U/L (ref 0–37)
Albumin: 4.7 g/dL (ref 3.5–5.2)
Alkaline Phosphatase: 40 U/L (ref 39–117)
BUN: 15 mg/dL (ref 6–23)
CO2: 29 mEq/L (ref 19–32)
Calcium: 9.7 mg/dL (ref 8.4–10.5)
Chloride: 98 mEq/L (ref 96–112)
Creatinine, Ser: 0.65 mg/dL (ref 0.40–1.20)
GFR: 89.1 mL/min (ref >60.00)
Glucose, Bld: 136 mg/dL — ABNORMAL HIGH (ref 70–99)
Potassium: 4.4 mEq/L (ref 3.5–5.1)
Sodium: 135 mEq/L (ref 135–145)
Total Bilirubin: 0.5 mg/dL (ref 0.2–1.2)
Total Protein: 7.2 g/dL (ref 6.0–8.3)

## 2021-08-25 LAB — CBC WITH DIFFERENTIAL/PLATELET
Basophils Absolute: 0 10*3/uL (ref 0.0–0.1)
Basophils Relative: 0.4 % (ref 0.0–3.0)
Eosinophils Absolute: 0.2 10*3/uL (ref 0.0–0.7)
Eosinophils Relative: 2.1 % (ref 0.0–5.0)
HCT: 39.8 % (ref 36.0–46.0)
Hemoglobin: 13.4 g/dL (ref 12.0–15.0)
Lymphocytes Relative: 43 % (ref 12.0–46.0)
Lymphs Abs: 3.4 10*3/uL (ref 0.7–4.0)
MCHC: 33.6 g/dL (ref 30.0–36.0)
MCV: 83 fl (ref 78.0–100.0)
Monocytes Absolute: 0.7 10*3/uL (ref 0.1–1.0)
Monocytes Relative: 8.4 % (ref 3.0–12.0)
Neutro Abs: 3.6 10*3/uL (ref 1.4–7.7)
Neutrophils Relative %: 46.1 % (ref 43.0–77.0)
Platelets: 276 10*3/uL (ref 150.0–400.0)
RBC: 4.8 Mil/uL (ref 3.87–5.11)
RDW: 14.1 % (ref 11.5–15.5)
WBC: 7.8 10*3/uL (ref 4.0–10.5)

## 2021-08-25 LAB — MICROALBUMIN / CREATININE URINE RATIO
Creatinine,U: 69.5 mg/dL
Microalb Creat Ratio: 1 mg/g (ref 0.0–30.0)
Microalb, Ur: <0.7 mg/dL (ref 0.0–1.9)

## 2021-08-25 LAB — LIPID PANEL
Cholesterol: 114 mg/dL (ref 0–200)
HDL: 39 mg/dL — ABNORMAL LOW (ref >39.00)
LDL Cholesterol: 42 mg/dL (ref 0–99)
NonHDL: 74.56
Total CHOL/HDL Ratio: 3
Triglycerides: 164 mg/dL — ABNORMAL HIGH (ref 0.0–149.0)
VLDL: 32.8 mg/dL (ref 0.0–40.0)

## 2021-08-25 LAB — HEMOGLOBIN A1C: Hgb A1c MFr Bld: 6.7 % — ABNORMAL HIGH (ref 4.6–6.5)

## 2021-09-03 ENCOUNTER — Other Ambulatory Visit: Payer: Self-pay

## 2021-09-03 DIAGNOSIS — E1159 Type 2 diabetes mellitus with other circulatory complications: Secondary | ICD-10-CM

## 2021-09-03 MED ORDER — ACCU-CHEK GUIDE VI STRP: ORAL_STRIP | 5 refills | Status: DC

## 2021-09-17 ENCOUNTER — Encounter: Payer: Self-pay | Admitting: Internal Medicine

## 2021-09-17 DIAGNOSIS — M1710 Unilateral primary osteoarthritis, unspecified knee: Secondary | ICD-10-CM

## 2021-09-17 DIAGNOSIS — G4700 Insomnia, unspecified: Secondary | ICD-10-CM

## 2021-09-18 NOTE — Telephone Encounter (Signed)
Last seen 08/06/2021. Pended for your approval or denial.

## 2021-09-21 MED ORDER — MELOXICAM 15 MG PO TABS: 15.0000 mg | ORAL_TABLET | Freq: Every day | ORAL | 3 refills | Status: DC | PRN

## 2021-09-21 MED ORDER — ZOLPIDEM TARTRATE 5 MG PO TABS: 5.0000 mg | ORAL_TABLET | Freq: Every evening | ORAL | 1 refills | Status: DC | PRN

## 2021-12-29 ENCOUNTER — Telehealth: Payer: Self-pay | Admitting: Internal Medicine

## 2021-12-29 NOTE — Telephone Encounter (Signed)
Copied from [facility] [id number]. Topic: Medicare AWV ?>> Dec 29, 2021 10:08 AM Harris-Coley, Hannah Beat wrote: ?Reason for CRM: Left message for patient to schedule Annual Wellness Visit.  Please schedule with Nurse Health Advisor Denisa O'Brien-Blaney, LPN at [facility].  Please call [phone] ask for Juliann Pulse ?

## 2022-01-05 ENCOUNTER — Telehealth: Payer: Self-pay | Admitting: Internal Medicine

## 2022-01-05 ENCOUNTER — Ambulatory Visit: Payer: Medicare Other | Admitting: Internal Medicine

## 2022-01-05 NOTE — Telephone Encounter (Signed)
Pts husband in the hospital please reschedule appt and do not charge no show fee  ? ?Thank you ?

## 2022-01-07 ENCOUNTER — Other Ambulatory Visit: Payer: Self-pay | Admitting: Internal Medicine

## 2022-01-07 DIAGNOSIS — E119 Type 2 diabetes mellitus without complications: Secondary | ICD-10-CM

## 2022-01-07 DIAGNOSIS — I152 Hypertension secondary to endocrine disorders: Secondary | ICD-10-CM

## 2022-01-11 ENCOUNTER — Encounter: Payer: Self-pay | Admitting: Internal Medicine

## 2022-01-11 ENCOUNTER — Other Ambulatory Visit: Payer: Self-pay | Admitting: Internal Medicine

## 2022-01-11 ENCOUNTER — Other Ambulatory Visit: Payer: Self-pay

## 2022-01-11 DIAGNOSIS — I152 Hypertension secondary to endocrine disorders: Secondary | ICD-10-CM

## 2022-01-11 DIAGNOSIS — E119 Type 2 diabetes mellitus without complications: Secondary | ICD-10-CM

## 2022-01-11 MED ORDER — RYBELSUS 7 MG PO TABS: 1.0000 | ORAL_TABLET | Freq: Every day | ORAL | 3 refills | Status: DC

## 2022-01-26 ENCOUNTER — Ambulatory Visit (INDEPENDENT_AMBULATORY_CARE_PROVIDER_SITE_OTHER): Payer: Medicare Other | Admitting: Internal Medicine

## 2022-01-26 ENCOUNTER — Encounter: Payer: Self-pay | Admitting: Internal Medicine

## 2022-01-26 VITALS — BP 124/70 | HR 89 | Temp 98.2°F | Resp 14 | Ht 67.0 in | Wt 150.8 lb

## 2022-01-26 DIAGNOSIS — G4700 Insomnia, unspecified: Secondary | ICD-10-CM | POA: Diagnosis not present

## 2022-01-26 DIAGNOSIS — E1165 Type 2 diabetes mellitus with hyperglycemia: Secondary | ICD-10-CM

## 2022-01-26 DIAGNOSIS — E1159 Type 2 diabetes mellitus with other circulatory complications: Secondary | ICD-10-CM | POA: Diagnosis not present

## 2022-01-26 DIAGNOSIS — I10 Essential (primary) hypertension: Secondary | ICD-10-CM | POA: Diagnosis not present

## 2022-01-26 DIAGNOSIS — E785 Hyperlipidemia, unspecified: Secondary | ICD-10-CM | POA: Diagnosis not present

## 2022-01-26 DIAGNOSIS — E119 Type 2 diabetes mellitus without complications: Secondary | ICD-10-CM | POA: Diagnosis not present

## 2022-01-26 DIAGNOSIS — Z1231 Encounter for screening mammogram for malignant neoplasm of breast: Secondary | ICD-10-CM | POA: Diagnosis not present

## 2022-01-26 DIAGNOSIS — I152 Hypertension secondary to endocrine disorders: Secondary | ICD-10-CM | POA: Diagnosis not present

## 2022-01-26 MED ORDER — ZOLPIDEM TARTRATE 5 MG PO TABS: 5.0000 mg | ORAL_TABLET | Freq: Every evening | ORAL | 1 refills | Status: DC | PRN

## 2022-01-26 MED ORDER — GLIPIZIDE ER 10 MG PO TB24: 10.0000 mg | ORAL_TABLET | Freq: Every day | ORAL | 3 refills | Status: DC

## 2022-01-26 MED ORDER — METFORMIN HCL ER 500 MG PO TB24: 1000.0000 mg | ORAL_TABLET | Freq: Two times a day (BID) | ORAL | 3 refills | Status: DC

## 2022-01-26 MED ORDER — ROSUVASTATIN CALCIUM 10 MG PO TABS: 10.0000 mg | ORAL_TABLET | Freq: Every day | ORAL | 3 refills | Status: DC

## 2022-01-26 MED ORDER — LOSARTAN POTASSIUM-HCTZ 100-25 MG PO TABS: 1.0000 | ORAL_TABLET | Freq: Every day | ORAL | 3 refills | Status: DC

## 2022-01-26 MED ORDER — AMLODIPINE BESYLATE 2.5 MG PO TABS: 2.5000 mg | ORAL_TABLET | Freq: Every day | ORAL | 3 refills | Status: DC

## 2022-01-26 NOTE — Progress Notes (Signed)
Chief Complaint  ?Patient presents with  ? Follow-up  ?  No concerns, denies any pain. Requesting refills to be sent to [facility].  ? ?F/u  ?1. Htn/dm A1c 6.7 on meds  ?Norvasc 2.5 mg glipizide 10 xl hyzaar 100-25, metformin xr 500 mg, crestor 10 mg, rybelsus 58m qd doing well needs refills  ? ? ?Review of Systems  ?Constitutional:  Negative for weight loss.  ?HENT:  Negative for hearing loss.   ?Eyes:  Negative for blurred vision.  ?Respiratory:  Negative for shortness of breath.   ?Cardiovascular:  Negative for chest pain.  ?Gastrointestinal:  Negative for abdominal pain and blood in stool.  ?Genitourinary:  Negative for dysuria.  ?Musculoskeletal:  Negative for back pain, falls and joint pain.  ?Skin:  Negative for rash.  ?Neurological:  Negative for headaches.  ?Psychiatric/Behavioral:  Negative for depression.   ?Past Medical History:  ?Diagnosis Date  ? Arthritis   ? Bacterial vaginosis   ? Breast cancer, right ([place] 2000  ?  lumpectomy 8 rounds chemo, 6 weeks radiation mammo 02/24/17 negative on Arimedex x 7 years   ? Cataract   ? COVID-19   ? 04/13/21  ? [place]   ? Diverticulosis   ? GERD (gastroesophageal reflux disease)   ? History of chicken pox   ? Hyperlipidemia   ? Hypertension   ? Personal history of chemotherapy 2000  ? Rt. Breast  ? Personal history of radiation therapy 2000  ? Rt. breast  ? UTI (urinary tract infection)   ? UTI (urinary tract infection)   ? e coli 12/2020   ? ?Past Surgical History:  ?Procedure Laterality Date  ? BREAST EXCISIONAL BIOPSY Right 2000  ? +  ? BREAST SURGERY    ? 2000  ? WRIST SURGERY Left   ? ?Family History  ?Problem Relation Age of Onset  ? Colon cancer Mother 641 ? Heart disease Father   ? Stroke Father   ? Arthritis Brother   ? Diabetes Brother   ? Breast cancer Maternal Grandmother   ? Heart disease Maternal Grandfather   ? Arthritis Brother   ? Alcohol abuse Son   ? Hypertension Son   ? Esophageal cancer Neg Hx   ?  Stomach cancer Neg Hx   ? Liver disease Neg Hx   ? ?Social History  ? ?Socioeconomic History  ? Marital status: Married  ?  Spouse name: Not on file  ? Number of children: Not on file  ? Years of education: Not on file  ? Highest education level: Not on file  ?Occupational History  ? Not on file  ?Tobacco Use  ? Smoking status: Never  ? Smokeless tobacco: Never  ?Substance and Sexual Activity  ? Alcohol use: Yes  ?  Comment: 2 drinks per month  ? Drug use: Not Currently  ? Sexual activity: Yes  ?  Partners: Male  ?Other Topics Concern  ? Not on file  ?Social History Narrative  ? Twin sons 1 daughter   ? Married husband TMarcello Moores  ? Retired [employer], back clerk retired   ? [place] ? Never smoker   ? 3 pregnancys 4 live births  ? No guns, wears seat belt safe in relationship   ?   ? ?Social Determinants of Health  ? ?Financial Resource Strain: Not on file  ?Food Insecurity: Not on file  ?  Transportation Needs: Not on file  ?Physical Activity: Not on file  ?Stress: Not on file  ?Social Connections: Not on file  ?Intimate Partner Violence: Not on file  ? ?Current Meds  ?Medication Sig  ? Accu-Chek Softclix Lancets lancets Used to check blood sugars once per day. Softclix lancets or ask pt which she prefers  ? aspirin EC 81 MG tablet Take 81 mg by mouth daily.  ? blood glucose meter kit and supplies KIT Dispense based on patient and insurance preference. Use up to four times daily as directed. (FOR ICD-9 250.00, 250.01).  ? blood glucose meter kit and supplies Dispense based on patient and insurance preference. Bid  directed. (FOR ICD-10 E10.9, E11.9). 1 year supply strips and lancets  ? Blood Glucose Monitoring Suppl (ACCU-CHEK GUIDE) w/Device KIT 1 Device by Does not apply route daily.  ? glucose blood (ACCU-CHEK GUIDE) test strip Used to check blood sugar once per day E11.9.  ? Magnesium 250 MG TABS Take by mouth daily.  ? meclizine (ANTIVERT) 12.5 MG  tablet Take 1 tablet (12.5 mg total) by mouth 2 (two) times daily as needed for dizziness.  ? meloxicam (MOBIC) 15 MG tablet Take 1 tablet (15 mg total) by mouth daily as needed for pain.  ? Multiple Vitamin (MULTI-VITAMIN DAILY PO) Take by mouth.  ? Omega-3 Fatty Acids (FISH OIL PO) Take by mouth.  ? Semaglutide (RYBELSUS) 7 MG TABS Take 1 tablet by mouth daily. In 1 month  ? Vitamin D, Cholecalciferol, 1000 units CAPS Take 2,000 Int'l Units/day by mouth.   ? [DISCONTINUED] amLODipine (NORVASC) 2.5 MG tablet Take 1 tablet (2.5 mg total) by mouth daily.  ? [DISCONTINUED] glipiZIDE (GLUCOTROL XL) 10 MG 24 hr tablet Take 1 tablet (10 mg total) by mouth daily with breakfast.  ? [DISCONTINUED] losartan-hydrochlorothiazide (HYZAAR) 100-25 MG tablet Take 1 tablet by mouth daily. In am  ? [DISCONTINUED] metFORMIN (GLUCOPHAGE-XR) 500 MG 24 hr tablet Take 2 tablets (1,000 mg total) by mouth 2 (two) times daily with a meal.  ? [DISCONTINUED] rosuvastatin (CRESTOR) 10 MG tablet Take 1 tablet (10 mg total) by mouth at bedtime.  ? [DISCONTINUED] zolpidem (AMBIEN) 5 MG tablet Take 1 tablet (5 mg total) by mouth at bedtime as needed for sleep.  ? ?Current Facility-Administered Medications for the 01/26/22 encounter (Office Visit) with McLean-Scocuzza, Nino Glow, MD  ?Medication  ? 0.9 %  sodium chloride infusion  ? ?Allergies  ?Allergen Reactions  ? Lisinopril   ?  Cough ?  ? ?No results found for this or any previous visit (from the past 2160 hour(s)). ?Objective  ?Body mass index is 23.62 kg/m?. ?Wt Readings from Last 3 Encounters:  ?01/26/22 150 lb 12.8 oz (68.4 kg)  ?08/06/21 157 lb 6.4 oz (71.4 kg)  ?04/15/21 159 lb 6.4 oz (72.3 kg)  ? ?Temp Readings from Last 3 Encounters:  ?01/26/22 98.2 ?F (36.8 ?C) (Oral)  ?08/06/21 98.1 ?F (36.7 ?C) (Oral)  ?01/06/21 98 ?F (36.7 ?C)  ? ?BP Readings from Last 3 Encounters:  ?01/26/22 124/70  ?08/06/21 130/72  ?04/15/21 130/76  ? ?Pulse Readings from Last 3 Encounters:  ?01/26/22 89   ?08/06/21 95  ?01/06/21 (!) 104  ? ? ?Physical Exam ?Vitals and nursing note reviewed.  ?Constitutional:   ?   Appearance: Normal appearance. She is well-developed and well-groomed.  ?HENT:  ?   Head: Normocephalic and atraumatic.  ?Eyes:  ?   Conjunctiva/sclera: Conjunctivae normal.  ?   Pupils: Pupils are equal, round,  and reactive to light.  ?Cardiovascular:  ?   Rate and Rhythm: Normal rate and regular rhythm.  ?   Heart sounds: Normal heart sounds. No murmur heard. ?Pulmonary:  ?   Effort: Pulmonary effort is normal.  ?   Breath sounds: Normal breath sounds.  ?Abdominal:  ?   General: Abdomen is flat. Bowel sounds are normal.  ?   Tenderness: There is no abdominal tenderness.  ?Musculoskeletal:     ?   General: No tenderness.  ?Skin: ?   General: Skin is warm and dry.  ?Neurological:  ?   General: No focal deficit present.  ?   Mental Status: She is alert and oriented to person, place, and time. Mental status is at baseline.  ?   Cranial Nerves: Cranial nerves 2-12 are intact.  ?   Gait: Gait is intact.  ?Psychiatric:     ?   Attention and Perception: Attention and perception normal.     ?   Mood and Affect: Mood and affect normal.     ?   Speech: Speech normal.     ?   Behavior: Behavior normal. Behavior is cooperative.     ?   Thought Content: Thought content normal.     ?   Cognition and Memory: Cognition and memory normal.     ?   Judgment: Judgment normal.  ? ? ?Assessment  ?Plan  ? ?Hypertension associated with diabetes (HCC)/hld dm 2 and htn controlled- Plan: metFORMIN (GLUCOPHAGE-XR) 500 MG 24 hr tablet,  ?Norvasc 2.5 mg glipizide 10 xl hyzaar 100-25, metformin xr 500 mg, crestor 10 mg, rybelsus 47m qd doing well needs refills  ? ?Insomnia, unspecified type - Plan: zolpidem (AMBIEN) 5 MG tablet ? ?HM ?Flu shot utd  ?prevnar had 02/02/17 ?pna 23 utd   ?MMR NOT immune mumps consider vaccine ?covid vaccine 5/5  moderna 4 ?2/2 shingrix  ?Tdap 11/08/15 ?  ?hep A/B consider  ?-consider in future fatty liver not  immune ?Hep C negative 12/17/16  ?  ?PCP per pt had 05/2014 colonoscopy normal mom +colon cancer had another  ?  ?colonoscopy 11/10/20 [facility] with polyps tubular x 3 f/u in 3 years rec  ?  ?DEXA 03/16/17 normal s

## 2022-02-19 ENCOUNTER — Other Ambulatory Visit (INDEPENDENT_AMBULATORY_CARE_PROVIDER_SITE_OTHER): Payer: Medicare Other

## 2022-02-19 DIAGNOSIS — E1159 Type 2 diabetes mellitus with other circulatory complications: Secondary | ICD-10-CM | POA: Diagnosis not present

## 2022-02-19 DIAGNOSIS — I152 Hypertension secondary to endocrine disorders: Secondary | ICD-10-CM | POA: Diagnosis not present

## 2022-02-19 LAB — CBC WITH DIFFERENTIAL/PLATELET
Basophils Absolute: 0 10*3/uL (ref 0.0–0.1)
Basophils Relative: 0.3 % (ref 0.0–3.0)
Eosinophils Absolute: 0.2 10*3/uL (ref 0.0–0.7)
Eosinophils Relative: 2.2 % (ref 0.0–5.0)
HCT: 39.2 % (ref 36.0–46.0)
Hemoglobin: 13.5 g/dL (ref 12.0–15.0)
Lymphocytes Relative: 48.1 % — ABNORMAL HIGH (ref 12.0–46.0)
Lymphs Abs: 3.7 10*3/uL (ref 0.7–4.0)
MCHC: 34.5 g/dL (ref 30.0–36.0)
MCV: 82.7 fl (ref 78.0–100.0)
Monocytes Absolute: 0.7 10*3/uL (ref 0.1–1.0)
Monocytes Relative: 8.9 % (ref 3.0–12.0)
Neutro Abs: 3.1 10*3/uL (ref 1.4–7.7)
Neutrophils Relative %: 40.5 % — ABNORMAL LOW (ref 43.0–77.0)
Platelets: 247 10*3/uL (ref 150.0–400.0)
RBC: 4.74 Mil/uL (ref 3.87–5.11)
RDW: 13.7 % (ref 11.5–15.5)
WBC: 7.7 10*3/uL (ref 4.0–10.5)

## 2022-02-19 LAB — COMPREHENSIVE METABOLIC PANEL
ALT: 27 U/L (ref 0–35)
AST: 19 U/L (ref 0–37)
Albumin: 4.8 g/dL (ref 3.5–5.2)
Alkaline Phosphatase: 38 U/L — ABNORMAL LOW (ref 39–117)
BUN: 14 mg/dL (ref 6–23)
CO2: 29 mEq/L (ref 19–32)
Calcium: 9.8 mg/dL (ref 8.4–10.5)
Chloride: 97 mEq/L (ref 96–112)
Creatinine, Ser: 0.65 mg/dL (ref 0.40–1.20)
GFR: 88.79 mL/min (ref >60.00)
Glucose, Bld: 117 mg/dL — ABNORMAL HIGH (ref 70–99)
Potassium: 4.4 mEq/L (ref 3.5–5.1)
Sodium: 135 mEq/L (ref 135–145)
Total Bilirubin: 0.5 mg/dL (ref 0.2–1.2)
Total Protein: 7.4 g/dL (ref 6.0–8.3)

## 2022-02-19 LAB — LIPID PANEL
Cholesterol: 103 mg/dL (ref 0–200)
HDL: 36.2 mg/dL — ABNORMAL LOW (ref >39.00)
LDL Cholesterol: 43 mg/dL (ref 0–99)
NonHDL: 66.94
Total CHOL/HDL Ratio: 3
Triglycerides: 118 mg/dL (ref 0.0–149.0)
VLDL: 23.6 mg/dL (ref 0.0–40.0)

## 2022-02-19 LAB — HEMOGLOBIN A1C: Hgb A1c MFr Bld: 6 % (ref 4.6–6.5)

## 2022-02-22 ENCOUNTER — Ambulatory Visit (INDEPENDENT_AMBULATORY_CARE_PROVIDER_SITE_OTHER): Payer: Medicare Other

## 2022-02-22 VITALS — Ht 67.0 in | Wt 150.0 lb

## 2022-02-22 DIAGNOSIS — Z0000 Encounter for general adult medical examination without abnormal findings: Secondary | ICD-10-CM

## 2022-02-22 NOTE — Patient Instructions (Addendum)
  Christy Stout , Thank you for taking time to come for your Medicare Wellness Visit. I appreciate your ongoing commitment to your health goals. Please review the following plan we discussed and let me know if I can assist you in the future.       Goals Addressed             This Visit's Progress    Weight goal 147lb       Maintain A1C at 7     This is a list of the screening recommended for you and due dates:  Health Maintenance  Topic Date Due   Eye exam for diabetics  04/24/2022*   Flu Shot  05/04/2022   Hemoglobin A1C  08/22/2022   Complete foot exam   01/27/2023   Mammogram  05/12/2023   Colon Cancer Screening  11/11/2023   Tetanus Vaccine  11/07/2025   Pneumonia Vaccine  Completed   DEXA scan (bone density measurement)  Completed   COVID-19 Vaccine  Completed   Hepatitis C Screening: USPSTF Recommendation to screen - Ages 74-79 yo.  Completed   Zoster (Shingles) Vaccine  Completed   HPV Vaccine  Aged Out  *Topic was postponed. The date shown is not the original due date.

## 2022-02-22 NOTE — Progress Notes (Signed)
Subjective:   Jearldine Cassady is a 71 y.o. female who presents for Medicare Annual (Subsequent) preventive examination.  Review of Systems    No ROS.  Medicare Wellness Virtual Visit.  Visual/audio telehealth visit, UTA vital signs.   See social history for additional risk factors.   Cardiac Risk Factors include: advanced age (>6mn, >>19women)     Objective:    Today's Vitals   02/22/22 1449  Weight: 150 lb (68 kg)  Height: _0  (1.702 m)   Body mass index is 23.49 kg/m.     02/22/2022    2:56 PM 11/10/2020    1:36 PM 04/28/2020    2:48 PM 04/21/2020   12:45 PM 03/13/2020    1:42 PM  Advanced Directives  Does Patient Have a Medical Advance Directive? Yes Yes  Yes Yes  Type of [place] Living will [place] will Living will  Does patient want to make changes to medical advance directive? No - Patient declined No - Patient declined  No - Patient declined   Copy of [place] Chart? No - copy requested   No - copy requested     Current Medications (verified) Outpatient Encounter Medications as of 02/22/2022  Medication Sig   Accu-Chek Softclix Lancets lancets Used to check blood sugars once per day. Softclix lancets or ask pt which she prefers   amLODipine (NORVASC) 2.5 MG tablet Take 1 tablet (2.5 mg total) by mouth daily.   aspirin EC 81 MG tablet Take 81 mg by mouth daily.   blood glucose meter kit and supplies KIT Dispense based on patient and insurance preference. Use up to four times daily as directed. (FOR ICD-9 250.00, 250.01).   blood glucose meter kit and supplies Dispense based on patient and insurance preference. Bid  directed. (FOR ICD-10 E10.9, E11.9). 1 year supply strips and lancets   Blood Glucose Monitoring Suppl (ACCU-CHEK GUIDE) w/Device KIT 1 Device by Does not apply route daily.   glipiZIDE (GLUCOTROL XL) 10 MG 24 hr  tablet Take 1 tablet (10 mg total) by mouth daily with breakfast.   glucose blood (ACCU-CHEK GUIDE) test strip Used to check blood sugar once per day E11.9.   losartan-hydrochlorothiazide (HYZAAR) 100-25 MG tablet Take 1 tablet by mouth daily. In am   Magnesium 250 MG TABS Take by mouth daily.   meclizine (ANTIVERT) 12.5 MG tablet Take 1 tablet (12.5 mg total) by mouth 2 (two) times daily as needed for dizziness.   meloxicam (MOBIC) 15 MG tablet Take 1 tablet (15 mg total) by mouth daily as needed for pain.   metFORMIN (GLUCOPHAGE-XR) 500 MG 24 hr tablet Take 2 tablets (1,000 mg total) by mouth 2 (two) times daily with a meal.   Multiple Vitamin (MULTI-VITAMIN DAILY PO) Take by mouth.   Omega-3 Fatty Acids (FISH OIL PO) Take by mouth.   rosuvastatin (CRESTOR) 10 MG tablet Take 1 tablet (10 mg total) by mouth at bedtime.   Semaglutide (RYBELSUS) 7 MG TABS Take 1 tablet by mouth daily. In 1 month   Vitamin D, Cholecalciferol, 1000 units CAPS Take 2,000 Int'l Units/day by mouth.    zolpidem (AMBIEN) 5 MG tablet Take 1 tablet (5 mg total) by mouth at bedtime as needed for sleep.   Facility-Administered Encounter Medications as of 02/22/2022  Medication   0.9 %  sodium chloride infusion    Allergies (verified) Lisinopril   History:  Past Medical History:  Diagnosis Date   Arthritis    Bacterial vaginosis    Breast cancer, right ([facility]) 2000    lumpectomy 8 rounds chemo, 6 weeks radiation mammo 02/24/17 negative on Arimedex x 7 years    Cataract    COVID-19    04/13/21   Diabetes mellitus without complication (HCC)    Diverticulosis    GERD (gastroesophageal reflux disease)    History of chicken pox    Hyperlipidemia    Hypertension    Personal history of chemotherapy 2000   Rt. Breast   Personal history of radiation therapy 2000   Rt. breast   UTI (urinary tract infection)    UTI (urinary tract infection)    e coli 12/2020    Past Surgical History:  Procedure Laterality Date    BREAST EXCISIONAL BIOPSY Right 2000   +   BREAST SURGERY     2000   WRIST SURGERY Left    Family History  Problem Relation Age of Onset   Colon cancer Mother 10   Heart disease Father    Stroke Father    Arthritis Brother    Diabetes Brother    Breast cancer Maternal Grandmother    Heart disease Maternal Grandfather    Arthritis Brother    Alcohol abuse Son    Hypertension Son    Esophageal cancer Neg Hx    Stomach cancer Neg Hx    Liver disease Neg Hx    Social History   Socioeconomic History   Marital status: Married    Spouse name: Not on file   Number of children: Not on file   Years of education: Not on file   Highest education level: Not on file  Occupational History   Not on file  Tobacco Use   Smoking status: Never   Smokeless tobacco: Never  Substance and Sexual Activity   Alcohol use: Yes    Comment: 2 drinks per month   Drug use: Not Currently   Sexual activity: Yes    Partners: Male  Other Topics Concern   Not on file  Social History Narrative   Twin sons 1 daughter    Married husband Marcello Moores    Retired HS ed, back clerk retired    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 also used to live in [place], from [place]   Never smoker    3 pregnancys 4 live births   No guns, wears seat belt safe in relationship       Social Determinants of Radio broadcast assistant Strain: Low Risk    Difficulty of Paying Living Expenses: Not hard at [employer] Insecurity: No Food Insecurity   Worried About Charity fundraiser in the Last Year: Never true   Arboriculturist in the Last Year: Never true  Transportation Needs: No Transportation Needs   Lack of Transportation (Medical): No   Lack of Transportation (Non-Medical): No  Physical Activity: Sufficiently Active   Days of Exercise per Week: 5 days   Minutes of Exercise per Session: 30 min  Stress: No Stress Concern Present   Feeling of Stress : Not at all  Social Connections: Unknown   Frequency of  Communication with Friends and Family: More than three times a week   Frequency of Social Gatherings with Friends and Family: More than three times a week   Attends Religious Services: Not on file   Active Member of Clubs or Organizations: Not on file  Attends Archivist Meetings: Not on file   Marital Status: Married    Tobacco Counseling Counseling given: Not Answered   Clinical Intake:  Pre-visit preparation completed: Yes        Diabetes: Yes (Followed by PCP)  How often do you need to have someone help you when you read instructions, pamphlets, or other written materials from your doctor or pharmacy?: 1 - Never  Interpreter Needed?: No      Activities of Daily Living    02/22/2022    2:58 PM  In your present state of health, do you have any difficulty performing the following activities:  Hearing? 0  Vision? 0  Difficulty concentrating or making decisions? 0  Walking or climbing stairs? 0  Dressing or bathing? 0  Doing errands, shopping? 0  Preparing Food and eating ? N  Using the Toilet? N  In the past six months, have you accidently leaked urine? N  Do you have problems with loss of bowel control? N  Managing your Medications? N  Managing your Finances? N  Housekeeping or managing your Housekeeping? N    Patient Care Team: McLean-Scocuzza, Nino Glow, MD as PCP - General (Internal Medicine)  Indicate any recent Medical Services you may have received from other than [facility] providers in the past year (date may be approximate).     Assessment:   This is a routine wellness examination for [place].  Virtual Visit via Telephone Note  I connected with  Ellis Parents on 02/22/22 at  2:45 PM EDT by telephone and verified that I am speaking with the correct person using two identifiers.  Persons participating in the virtual visit: patient/Nurse Health Advisor   I discussed the limitations of performing an evaluation and management service by  telehealth. We continued and completed visit with audio only. Some vital signs may be absent or patient reported.   Hearing/Vision screen Hearing Screening - Comments:: Patient is able to hear conversational tones without difficulty.  No issues reported. Vision Screening - Comments:: Follow[facility] by [facility]  Wears reader lenses  No retinopathy reported.   Dietary issues and exercise activities discussed:   Low sodium diet Low carb diet Good water intake   Goals Addressed             This Visit's Progress    Weight goal 147lb       Maintain A1C at 7       Depression Screen    02/22/2022    2:54 PM 01/26/2022   10:55 AM 08/06/2021    1:58 PM 04/21/2020   12:47 PM 04/03/2020   10:45 AM 07/31/2019    2:14 PM 03/28/2019    1:46 PM  PHQ 2/9 Scores  PHQ - 2 Score 0 0 0 0 0 0 0  PHQ- 9 Score       0    Fall Risk    02/22/2022    2:57 PM 01/26/2022   10:55 AM 08/06/2021    1:58 PM 12/08/2020    4:50 PM 08/06/2020   10:46 AM  [place] in the past year? 0 0 0 0 0  Number falls in past yr: 0 0 0  0  Injury with Fall?  0 0  0  Risk for fall due to :  No Fall Risks No Fall Risks    Follow up _0     FALL  RISK PREVENTION PERTAINING TO THE HOME: Home free of loose throw rugs in walkways, pet beds, electrical cords, etc? Yes  Adequate lighting in your home to reduce risk of falls? Yes   ASSISTIVE DEVICES UTILIZED TO PREVENT FALLS: Life alert? No  Use of a cane, walker or w/c? No   TIMED UP AND GO: Was the test performed? No .   Cognitive Function:  Patient is alert and oriented x3.       04/21/2020   12:49 PM  6CIT Screen  What Year? 0 points  What month? 0 points  What time? 0 points  Count back from 20 0 points  Months in reverse 0 points  Repeat phrase 0 points  Total Score 0 points    Immunizations Immunization  History  Administered Date(s) Administered   Fluad Quad(high Dose 65+) 06/15/2019, 06/23/2020, 06/24/2021   Influenza, High Dose Seasonal PF 06/13/2018   Moderna Covid-19 Vaccine Bivalent Booster 37yr & up 07/10/2021   Moderna SARS-COV2 Booster Vaccination 01/02/2021   Moderna Sars-Covid-2 Vaccination 11/03/2019, 12/03/2019, 07/25/2020   Pneumococcal Polysaccharide-23 03/16/2018   Zoster Recombinat (Shingrix) 05/14/2020, 09/05/2020   Screening Tests Health Maintenance  Topic Date Due   OPHTHALMOLOGY EXAM  04/24/2022 (Originally 08/13/2020)   INFLUENZA VACCINE  05/04/2022   HEMOGLOBIN A1C  08/22/2022   FOOT EXAM  01/27/2023   MAMMOGRAM  05/12/2023   COLONOSCOPY (Pts 45-433yrInsurance coverage will need to be confirmed)  11/11/2023   TETANUS/TDAP  11/07/2025   Pneumonia Vaccine 6543Years old  Completed   DEXA SCAN  Completed   COVID-19 Vaccine  Completed   Hepatitis C Screening  Completed   Zoster Vaccines- Shingrix  Completed   HPV VACCINES  Aged Out   Health Maintenance There are no preventive care reminders to display for this patient.  Lung Cancer Screening: (Low Dose CT Chest recommended if Age 71-80ears, 30 pack-year currently smoking OR have quit w/in 15years.) does not qualify.   Vision Screening: Recommended annual ophthalmology exams for early detection of glaucoma and other disorders of the eye.  Dental Screening: Recommended annual dental exams for proper oral hygiene  Community Resource Referral / Chronic Care Management: CRR required this visit?  No   CCM required this visit?  No      Plan:   Keep all routine maintenance appointments.   I have personally reviewed and noted the following in the patient's chart:   Medical and social history Use of alcohol, tobacco or illicit drugs  Current medications and supplements including opioid prescriptions.  Functional ability and status Nutritional status Physical activity Advanced directives List of other  physicians Hospitalizations, surgeries, and ER visits in previous 12 months Vitals Screenings to include cognitive, depression, and falls Referrals and appointments  In addition, I have reviewed and discussed with patient certain preventive protocols, quality metrics, and best practice recommendations. A written personalized care plan for preventive services as well as general preventive health recommendations were provided to patient.     OBVarney BilesLPN   02/05/62/8756

## 2022-03-05 DIAGNOSIS — D2261 Melanocytic nevi of right upper limb, including shoulder: Secondary | ICD-10-CM | POA: Diagnosis not present

## 2022-03-05 DIAGNOSIS — D225 Melanocytic nevi of trunk: Secondary | ICD-10-CM | POA: Diagnosis not present

## 2022-03-05 DIAGNOSIS — L814 Other melanin hyperpigmentation: Secondary | ICD-10-CM | POA: Diagnosis not present

## 2022-03-05 DIAGNOSIS — D2271 Melanocytic nevi of right lower limb, including hip: Secondary | ICD-10-CM | POA: Diagnosis not present

## 2022-03-05 DIAGNOSIS — D2262 Melanocytic nevi of left upper limb, including shoulder: Secondary | ICD-10-CM | POA: Diagnosis not present

## 2022-03-05 DIAGNOSIS — L821 Other seborrheic keratosis: Secondary | ICD-10-CM | POA: Diagnosis not present

## 2022-03-05 DIAGNOSIS — D2272 Melanocytic nevi of left lower limb, including hip: Secondary | ICD-10-CM | POA: Diagnosis not present

## 2022-03-19 ENCOUNTER — Encounter: Payer: Self-pay | Admitting: Internal Medicine

## 2022-03-26 ENCOUNTER — Encounter: Payer: Self-pay | Admitting: Internal Medicine

## 2022-03-27 ENCOUNTER — Other Ambulatory Visit: Payer: Self-pay | Admitting: Family

## 2022-03-27 MED ORDER — BUSPIRONE HCL 7.5 MG PO TABS: 7.5000 mg | ORAL_TABLET | Freq: Two times a day (BID) | ORAL | 0 refills | Status: DC

## 2022-04-13 ENCOUNTER — Ambulatory Visit (INDEPENDENT_AMBULATORY_CARE_PROVIDER_SITE_OTHER): Payer: Medicare Other | Admitting: Internal Medicine

## 2022-04-13 ENCOUNTER — Encounter: Payer: Self-pay | Admitting: Internal Medicine

## 2022-04-13 VITALS — BP 130/80 | HR 81 | Temp 98.9°F | Resp 14 | Ht 67.0 in | Wt 148.2 lb

## 2022-04-13 DIAGNOSIS — F4321 Adjustment disorder with depressed mood: Secondary | ICD-10-CM

## 2022-04-13 DIAGNOSIS — F4323 Adjustment disorder with mixed anxiety and depressed mood: Secondary | ICD-10-CM

## 2022-04-13 DIAGNOSIS — D72820 Lymphocytosis (symptomatic): Secondary | ICD-10-CM

## 2022-04-13 MED ORDER — BUSPIRONE HCL 7.5 MG PO TABS: 7.5000 mg | ORAL_TABLET | Freq: Two times a day (BID) | ORAL | 1 refills | Status: DC

## 2022-04-13 MED ORDER — CITALOPRAM HYDROBROMIDE 10 MG PO TABS: 10.0000 mg | ORAL_TABLET | Freq: Every day | ORAL | 3 refills | Status: DC

## 2022-04-13 NOTE — Patient Instructions (Addendum)
Therapy options [facility] in [place], [place] Address: [address], [place], [place] [address] Hours:  Open ? Closes 6?PM Phone: [phone]  2. Manufacturing engineer  [facility] [place]  [phone]) [phone]   3. Virtual therapy   [facility] as given the info before for both  [facility] counseling and psychiatry [place]  [place] [phone]    [facility] psychiat[facility] [place]  [address]  [place] [address]  [phone]   1.Call and schedule mammogram  [place]  Phone: [phone]  2. Colonoscopy due 11/11/23  Dr. Wilfrid Lund  Phone Fax E-mail Address  [phone] Not available [place]   [place] [address]     Specialties        Dr. Janese Banks elevated lymphocytes check it out   Phone Fax E-mail Address  [phone] Not available [place] [address]     Specialties     hematology              Managing Loss, Adult People experience loss in many different ways throughout their lives. Events such as moving, changing jobs, and losing friends can create a sense of loss. The loss may be as serious as a major health change, divorce, death of a pet, or death of a loved one. All of these types of loss are likely to create a physical and emotional reaction known as grief. Grief is the result of a major change or an absence of something or someone that you count on. Grief is a normal reaction to loss. A variety of factors can affect your grieving experience, including: The nature of your loss. Your relationship to what or whom you lost. Your understanding of grief and how to manage it. Your support system. Be aware that when grief becomes extreme, it can lead to more severe issues like isolation, depression, anxiety, or suicidal thoughts. Talk with your  health care provider if you have any of these issues. How to manage lifestyle changes Keep to your normal routine as much as possible. If you have trouble focusing or doing normal activities, it is acceptable to take some time away from your normal routine. Spend time with friends and loved ones. Eat a healthy diet, get plenty of sleep, and rest when you feel tired. How to recognize changes  The way that you deal with your grief will affect your ability to function as you normally do. When grieving, you may experience these changes: Numbness, shock, sadness, anxiety, anger, denial, and guilt. Thoughts about death. Unexpected crying. A physical sensation of emptiness in your stomach. Problems sleeping and eating. Tiredness (fatigue). Loss of interest in normal activities. Dreaming about or imagining seeing the person who died. A need to remember what or whom you lost. Difficulty thinking about anything other than your loss for a period of time. Relief. If you have been expecting the loss for a while, you may feel a sense of relief when it happens. Follow these instructions at home: Activity Express your feelings in healthy ways, such as: Talking with others about your loss. It may be helpful to find others who have had a similar loss, such as a support group. Writing down your feelings in a journal. Doing physical activities to release stress and emotional energy. Doing creative activities like painting, sculpting,  or playing or listening to music. Practicing resilience. This is the ability to recover and adjust after facing challenges. Reading some resources that encourage resilience may help you to learn ways to practice those behaviors.  General instructions Be patient with yourself and others. Allow the grieving process to happen, and remember that grieving takes time. It is likely that you may never feel completely done with some grief. You may find a way to move on while still  cherishing memories and feelings about your loss. Accepting your loss is a process. It can take months or longer to adjust. Keep all follow-up visits. This is important. Where to find support To get support for managing loss: Ask your health care provider for help and recommendations, such as grief counseling or therapy. Think about joining a support group for people who are managing a loss. Where to find more information You can find more information about managing loss from: American Society of Clinical Oncology: www.cancer.net American Psychological Association: TVStereos.ch Contact a health care provider if: Your grief is extreme and keeps getting worse. You have ongoing grief that does not improve. Your body shows symptoms of grief, such as illness. You feel depressed, anxious, or hopeless. Get help right away if: You have thoughts about hurting yourself or others. Get help right away if you feel like you may hurt yourself or others, or have thoughts about taking your own life. Go to your nearest emergency room or: Call 911. Call the [place] at [phone] or 988. This is open 24 hours a day. Text the Crisis Text Line at 979-012-8643. Summary Grief is the result of a major change or an absence of someone or something that you count on. Grief is a normal reaction to loss. The depth of grief and the period of recovery depend on the type of loss and your ability to adjust to the change and process your feelings. Processing grief requires patience and a willingness to accept your feelings and talk about your loss with people who are supportive. It is important to find resources that work for you and to realize that people experience grief differently. There is not one grieving process that works for everyone in the same way. Be aware that when grief becomes extreme, it can lead to more severe issues like isolation, depression, anxiety, or suicidal thoughts. Talk  with your health care provider if you have any of these issues. This information is not intended to replace advice given to you by your health care provider. Make sure you discuss any questions you have with your health care provider. Document Revised: 05/11/2021 Document Reviewed: 05/11/2021 Elsevier Patient Education  [place].

## 2022-04-13 NOTE — Progress Notes (Signed)
Chief Complaint  Patient presents with   Loss of husband    Disc loss of husband & labs back in 02/2022. Referral to hematology   F/u  1. Grief husband died Apr 18, 2022 on buspar 7.5 bid due to anxiety and helping needs refill has not done grief therapy she spread his ashes in [facility] near [place] recently in memory of him and those were his wishes and their anniversary was 04/05/22  She is having a hard time but daughter is moving down here 05/2022 which was planned prior to her husband dying  She is now planning to take his ashes to [facility] where his family is   Gad 7 score 5 and phq 9 score 6 today. She is still sad they did everything together    2. Lymphocytosis wants referral to hematology now      Review of Systems  Constitutional:  Negative for weight loss.  HENT:  Negative for hearing loss.   Eyes:  Negative for blurred vision.  Respiratory:  Negative for shortness of breath.   Cardiovascular:  Negative for chest pain.  Gastrointestinal:  Negative for abdominal pain and blood in stool.  Genitourinary:  Negative for dysuria.  Musculoskeletal:  Negative for falls and joint pain.  Skin:  Negative for rash.  Neurological:  Negative for headaches.  Psychiatric/Behavioral:  Positive for depression. The patient is nervous/anxious.    Past Medical History:  Diagnosis Date   Arthritis    Bacterial vaginosis    Breast cancer, right ([place]) 2000    lumpectomy 8 rounds chemo, 6 weeks radiation mammo 02/24/17 negative on Arimedex x 7 years    Cataract    COVID-19    04/13/21   Diabetes mellitus without complication (HCC)    Diverticulosis    GERD (gastroesophageal reflux disease)    History of chicken pox    Hyperlipidemia    Hypertension    Personal history of chemotherapy 2000   Rt. Breast   Personal history of radiation therapy 2000   Rt. breast   UTI (urinary tract infection)    UTI (urinary tract infection)    e coli 12/2020    Past Surgical History:  Procedure  Laterality Date   BREAST EXCISIONAL BIOPSY Right 2000   +   BREAST SURGERY     2000   WRIST SURGERY Left    Family History  Problem Relation Age of Onset   Colon cancer Mother 84   Heart disease Father    Stroke Father    Arthritis Brother    Diabetes Brother    Breast cancer Maternal Grandmother    Heart disease Maternal Grandfather    Arthritis Brother    Alcohol abuse Son    Hypertension Son    Esophageal cancer Neg Hx    Stomach cancer Neg Hx    Liver disease Neg Hx    Social History   Socioeconomic History   Marital status: Widowed    Spouse name: Not on file   Number of children: Not on file   Years of education: Not on file   Highest education level: Not on file  Occupational History   Not on file  Tobacco Use   Smoking status: Never   Smokeless tobacco: Never  Substance and Sexual Activity   Alcohol use: Yes    Comment: 2 drinks per month   Drug use: Not Currently   Sexual activity: Yes    Partners: Male  Other Topics Concern   Not on  file  Social History Narrative   Twin sons 1 daughter    Married husband Marcello Moores x 20 years he died 04-05-2022       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, back clerk retired    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 also used to live in [place], from [place]   Never smoker    3 pregnancys 4 live births   No guns, wears seat belt safe in relationship       Social Determinants of Health   Financial Resource Strain: Low Risk  (02/22/2022)   Overall Financial Resource Strain (CARDIA)    Difficulty of Paying Living Expenses: Not hard at all  Food Insecurity: No Food Insecurity (02/22/2022)   Hunger Vital Sign    Worried About Running Out of Food in the Last Year: Never true    [place] in the Last Year: Never true  Transportation Needs: No Transportation Needs (02/22/2022)   PRAPARE - Hydrologist (Medical): No    Lack of Transportation (Non-Medical): No  Physical Activity: Sufficiently Active (02/22/2022)    Exercise Vital Sign    Days of Exercise per Week: 5 days    Minutes of Exercise per Session: 30 min  Stress: No Stress Concern Present (02/22/2022)   [place]    Feeling of Stress : Not at all  Social Connections: Unknown (02/22/2022)   Social Connection and Isolation Panel [NHANES]    Frequency of Communication with Friends and Family: More than three times a week    Frequency of Social Gatherings with Friends and Family: More than three times a week    Attends Religious Services: Not on file    Active Member of Clubs or Organizations: Not on file    Attends Archivist Meetings: Not on file    Marital Status: Married  Intimate Partner Violence: Not At Risk (02/22/2022)   Humiliation, Afraid, Rape, and Kick questionnaire    Fear of Current or Ex-Partner: No    Emotionally Abused: No    Physically Abused: No    Sexually Abused: No   Current Meds  Medication Sig   Accu-Chek Softclix Lancets lancets Used to check blood sugars once per day. Softclix lancets or ask pt which she prefers   amLODipine (NORVASC) 2.5 MG tablet Take 1 tablet (2.5 mg total) by mouth daily.   aspirin EC 81 MG tablet Take 81 mg by mouth daily.   blood glucose meter kit and supplies KIT Dispense based on patient and insurance preference. Use up to four times daily as directed. (FOR ICD-9 250.00, 250.01).   blood glucose meter kit and supplies Dispense based on patient and insurance preference. Bid  directed. (FOR ICD-10 E10.9, E11.9). 1 year supply strips and lancets   Blood Glucose Monitoring Suppl (ACCU-CHEK GUIDE) w/Device KIT 1 Device by Does not apply route daily.   citalopram (CELEXA) 10 MG tablet Take 1 tablet (10 mg total) by mouth daily.   glipiZIDE (GLUCOTROL XL) 10 MG 24 hr tablet Take 1 tablet (10 mg total) by mouth daily with breakfast.   glucose blood (ACCU-CHEK GUIDE) test strip Used to check blood sugar once per day E11.9.    losartan-hydrochlorothiazide (HYZAAR) 100-25 MG tablet Take 1 tablet by mouth daily. In am   Magnesium 250 MG TABS Take by mouth daily.   meclizine (ANTIVERT) 12.5 MG tablet Take 1 tablet (12.5 mg total) by mouth 2 (two) times daily as needed  for dizziness.   meloxicam (MOBIC) 15 MG tablet Take 1 tablet (15 mg total) by mouth daily as needed for pain.   metFORMIN (GLUCOPHAGE-XR) 500 MG 24 hr tablet Take 2 tablets (1,000 mg total) by mouth 2 (two) times daily with a meal.   Multiple Vitamin (MULTI-VITAMIN DAILY PO) Take by mouth.   Omega-3 Fatty Acids (FISH OIL PO) Take by mouth.   rosuvastatin (CRESTOR) 10 MG tablet Take 1 tablet (10 mg total) by mouth at bedtime.   Semaglutide (RYBELSUS) 7 MG TABS Take 1 tablet by mouth daily. In 1 month   Vitamin D, Cholecalciferol, 1000 units CAPS Take 2,000 Int'l Units/day by mouth.    zolpidem (AMBIEN) 5 MG tablet Take 1 tablet (5 mg total) by mouth at bedtime as needed for sleep.   [DISCONTINUED] busPIRone (BUSPAR) 7.5 MG tablet Take 1 tablet (7.5 mg total) by mouth 2 (two) times daily.   Current Facility-Administered Medications for the 04/13/22 encounter (Office Visit) with McLean-Scocuzza, Pasty Spillers, MD  Medication   0.9 %  sodium chloride infusion   Allergies  Allergen Reactions   Lisinopril     Cough    Recent Results (from the past 2160 hour(s))  Lipid panel     Status: Abnormal   Collection Time: 02/19/22  8:00 AM  Result Value Ref Range   Cholesterol 103 0 - 200 mg/dL    Comment: ATP III Classification       Desirable:  < 200 mg/dL               Borderline High:  200 - 239 mg/dL          High:  > = 334 mg/dL   Triglycerides 860.1 0.0 - 149.0 mg/dL    Comment: Normal:  <947 mg/dLBorderline High:  150 - 199 mg/dL   HDL 86.54 (L) >56.13 mg/dL   VLDL 27.3 0.0 - 53.0 mg/dL   LDL Cholesterol 43 0 - 99 mg/dL   Total CHOL/HDL Ratio 3     Comment:                Men          Women1/2 Average Risk     3.4          3.3Average Risk          5.0           4.42X Average Risk          9.6          7.13X Average Risk          15.0          11.0                       NonHDL 66.94     Comment: NOTE:  Non-HDL goal should be 30 mg/dL higher than patient's LDL goal (i.e. LDL goal of < 70 mg/dL, would have non-HDL goal of < 100 mg/dL)  Hemoglobin Q9Z     Status: None   Collection Time: 02/19/22  8:00 AM  Result Value Ref Range   Hgb A1c MFr Bld 6.0 4.6 - 6.5 %    Comment: Glycemic Control Guidelines for People with Diabetes:Non Diabetic:  <6%Goal of Therapy: <7%Additional Action Suggested:  >8%   Comprehensive metabolic panel     Status: Abnormal   Collection Time: 02/19/22  8:00 AM  Result Value Ref Range   Sodium 135 135 - 145 mEq/L  Potassium 4.4 3.5 - 5.1 mEq/L   Chloride 97 96 - 112 mEq/L   CO2 29 19 - 32 mEq/L   Glucose, Bld 117 (H) 70 - 99 mg/dL   BUN 14 6 - 23 mg/dL   Creatinine, Ser 0.65 0.40 - 1.20 mg/dL   Total Bilirubin 0.5 0.2 - 1.2 mg/dL   Alkaline Phosphatase 38 (L) 39 - 117 U/L   AST 19 0 - 37 U/L   ALT 27 0 - 35 U/L   Total Protein 7.4 6.0 - 8.3 g/dL   Albumin 4.8 3.5 - 5.2 g/dL   GFR 88.79 >60.00 mL/min    Comment: Calculated using the CKD-EPI Creatinine Equation (2021)   Calcium 9.8 8.4 - 10.5 mg/dL  CBC with Differential/Platelet     Status: Abnormal   Collection Time: 02/19/22  8:00 AM  Result Value Ref Range   WBC 7.7 4.0 - 10.5 K/uL   RBC 4.74 3.87 - 5.11 Mil/uL   Hemoglobin 13.5 12.0 - 15.0 g/dL   HCT 39.2 36.0 - 46.0 %   MCV 82.7 78.0 - 100.0 fl   MCHC 34.5 30.0 - 36.0 g/dL   RDW 13.7 11.5 - 15.5 %   Platelets 247.0 150.0 - 400.0 K/uL   Neutrophils Relative % 40.5 (L) 43.0 - 77.0 %   Lymphocytes Relative 48.1 (H) 12.0 - 46.0 %   Monocytes Relative 8.9 3.0 - 12.0 %   Eosinophils Relative 2.2 0.0 - 5.0 %   Basophils Relative 0.3 0.0 - 3.0 %   Neutro Abs 3.1 1.4 - 7.7 K/uL   Lymphs Abs 3.7 0.7 - 4.0 K/uL   Monocytes Absolute 0.7 0.1 - 1.0 K/uL   Eosinophils Absolute 0.2 0.0 - 0.7 K/uL   Basophils  Absolute 0.0 0.0 - 0.1 K/uL   Objective  Body mass index is 23.21 kg/m. Wt Readings from Last 3 Encounters:  04/13/22 148 lb 3.2 oz (67.2 kg)  02/22/22 150 lb (68 kg)  01/26/22 150 lb 12.8 oz (68.4 kg)   Temp Readings from Last 3 Encounters:  04/13/22 98.9 F (37.2 C) (Oral)  01/26/22 98.2 F (36.8 C) (Oral)  08/06/21 98.1 F (36.7 C) (Oral)   BP Readings from Last 3 Encounters:  04/13/22 130/80  01/26/22 124/70  08/06/21 130/72   Pulse Readings from Last 3 Encounters:  04/13/22 81  01/26/22 89  08/06/21 95    Physical Exam Vitals and nursing note reviewed.  Constitutional:      Appearance: Normal appearance. She is well-developed and well-groomed.  HENT:     Head: Normocephalic and atraumatic.  Eyes:     Conjunctiva/sclera: Conjunctivae normal.     Pupils: Pupils are equal, round, and reactive to light.  Cardiovascular:     Rate and Rhythm: Normal rate and regular rhythm.     Heart sounds: Normal heart sounds. No murmur heard. Pulmonary:     Effort: Pulmonary effort is normal.     Breath sounds: Normal breath sounds.  Abdominal:     General: Abdomen is flat. Bowel sounds are normal.     Tenderness: There is no abdominal tenderness.  Musculoskeletal:        General: No tenderness.  Skin:    General: Skin is warm and dry.  Neurological:     General: No focal deficit present.     Mental Status: She is alert and oriented to person, place, and time. Mental status is at baseline.     Cranial Nerves: Cranial nerves 2-12 are intact.  Motor: Motor function is intact.     Coordination: Coordination is intact.     Gait: Gait is intact.  Psychiatric:        Attention and Perception: Attention and perception normal.        Mood and Affect: Mood and affect normal.        Speech: Speech normal.        Behavior: Behavior normal. Behavior is cooperative.        Thought Content: Thought content normal.        Cognition and Memory: Cognition and memory normal.         Judgment: Judgment normal.     Assessment  Plan  Grief - Plan: busPIRone (BUSPAR) 7.5 MG tablet, citalopram (CELEXA) 10 MG tablet  Grief reaction - Plan: busPIRone (BUSPAR) 7.5 MG tablet, citalopram (CELEXA) 10 MG tablet  Adjustment disorder with mixed anxiety and depressed mood - Plan: busPIRone (BUSPAR) 7.5 MG tablet, citalopram (CELEXA) 10 MG tablet  Therapy options [facility] in [place], [place] Address: [address] [place], [place] [address] Hours:  Open ? Closes 6?PM Phone: [phone]  2. Manufacturing engineer  [facility] [place]  [phone]) [phone]   3. Virtual therapy   [facility] as given the info before for both  [facility] [place]  [place] [phone]    [facility] [place]  [address] [place] [address]  [phone]   Lymphocytosis - Plan: Ambulatory referral to Hematology / Oncology Dr. Janese Banks   HM Flu shot utd  prevnar had 02/02/17 pna 23 utd   MMR NOT immune mumps consider vaccine covid vaccine 5/5  moderna 4 2/2 shingrix  Tdap 11/08/15   hep A/B consider  -consider in future fatty liver not immune Hep C negative 12/17/16    PCP per pt had 05/2014 colonoscopy normal mom +colon cancer had another    colonoscopy 11/10/20 [facility] with polyps tubular x 3 f/u in 3 years rec    DEXA 03/16/17 normal scanned in chart    Mammogram 05/11/21 negative ordered call and schedule   Out of age window pap no records from prior PCP    Est[facility] Dermatology Dr. Kellie Moor h/o [facility]  seen appt 10/2021   Ph[facility] -long term meds   Eye surgery tbd ? scheduled 11/08/19 pt moved back later date  [facility]  As of 01/26/22 [facility] to [facility] and fax report to me    Former pcp Judithe Modest NP [place] [phone] disc colonoscopy prev report     H/o Dr. Janese Banks Provider: Dr. Olivia Mackie McLean-Scocuzza-Internal Medicine

## 2022-04-15 ENCOUNTER — Other Ambulatory Visit: Payer: Self-pay | Admitting: *Deleted

## 2022-04-15 DIAGNOSIS — D72820 Lymphocytosis (symptomatic): Secondary | ICD-10-CM

## 2022-05-18 ENCOUNTER — Ambulatory Visit
Admission: RE | Admit: 2022-05-18 | Discharge: 2022-05-18 | Disposition: A | Discharge status: Home / Self Care | Payer: Medicare Other | Source: Ambulatory Visit | Attending: Internal Medicine | Admitting: Internal Medicine

## 2022-05-18 DIAGNOSIS — Z1231 Encounter for screening mammogram for malignant neoplasm of breast: Secondary | ICD-10-CM | POA: Diagnosis not present

## 2022-06-02 ENCOUNTER — Inpatient Hospital Stay (HOSPITAL_BASED_OUTPATIENT_CLINIC_OR_DEPARTMENT_OTHER): Payer: Medicare Other | Admitting: Oncology

## 2022-06-02 ENCOUNTER — Encounter: Payer: Self-pay | Admitting: Oncology

## 2022-06-02 ENCOUNTER — Inpatient Hospital Stay: Payer: Medicare Other | Attending: Oncology

## 2022-06-02 VITALS — BP 134/71 | HR 78 | Temp 98.2°F | Resp 16 | Wt 148.2 lb

## 2022-06-02 DIAGNOSIS — E119 Type 2 diabetes mellitus without complications: Secondary | ICD-10-CM | POA: Diagnosis not present

## 2022-06-02 DIAGNOSIS — Z923 Personal history of irradiation: Secondary | ICD-10-CM | POA: Insufficient documentation

## 2022-06-02 DIAGNOSIS — D72820 Lymphocytosis (symptomatic): Secondary | ICD-10-CM | POA: Diagnosis not present

## 2022-06-02 DIAGNOSIS — D709 Neutropenia, unspecified: Secondary | ICD-10-CM | POA: Diagnosis not present

## 2022-06-02 DIAGNOSIS — Z8249 Family history of ischemic heart disease and other diseases of the circulatory system: Secondary | ICD-10-CM | POA: Insufficient documentation

## 2022-06-02 DIAGNOSIS — Z811 Family history of alcohol abuse and dependence: Secondary | ICD-10-CM | POA: Insufficient documentation

## 2022-06-02 DIAGNOSIS — Z853 Personal history of malignant neoplasm of breast: Secondary | ICD-10-CM | POA: Insufficient documentation

## 2022-06-02 DIAGNOSIS — Z833 Family history of diabetes mellitus: Secondary | ICD-10-CM | POA: Insufficient documentation

## 2022-06-02 DIAGNOSIS — I10 Essential (primary) hypertension: Secondary | ICD-10-CM | POA: Insufficient documentation

## 2022-06-02 DIAGNOSIS — Z800 Family history of malignant neoplasm of digestive organs: Secondary | ICD-10-CM | POA: Diagnosis not present

## 2022-06-02 DIAGNOSIS — Z803 Family history of malignant neoplasm of breast: Secondary | ICD-10-CM | POA: Insufficient documentation

## 2022-06-02 DIAGNOSIS — Z8261 Family history of arthritis: Secondary | ICD-10-CM | POA: Insufficient documentation

## 2022-06-02 DIAGNOSIS — Z8616 Personal history of COVID-19: Secondary | ICD-10-CM | POA: Diagnosis not present

## 2022-06-02 DIAGNOSIS — Z888 Allergy status to other drugs, medicaments and biological substances status: Secondary | ICD-10-CM | POA: Insufficient documentation

## 2022-06-02 DIAGNOSIS — D1809 Hemangioma of other sites: Secondary | ICD-10-CM | POA: Diagnosis not present

## 2022-06-02 DIAGNOSIS — Z9221 Personal history of antineoplastic chemotherapy: Secondary | ICD-10-CM | POA: Insufficient documentation

## 2022-06-02 DIAGNOSIS — D708 Other neutropenia: Secondary | ICD-10-CM | POA: Diagnosis not present

## 2022-06-02 DIAGNOSIS — Z823 Family history of stroke: Secondary | ICD-10-CM | POA: Insufficient documentation

## 2022-06-02 DIAGNOSIS — E785 Hyperlipidemia, unspecified: Secondary | ICD-10-CM | POA: Insufficient documentation

## 2022-06-02 DIAGNOSIS — Z79899 Other long term (current) drug therapy: Secondary | ICD-10-CM | POA: Insufficient documentation

## 2022-06-02 DIAGNOSIS — Z87440 Personal history of urinary (tract) infections: Secondary | ICD-10-CM | POA: Diagnosis not present

## 2022-06-02 LAB — CBC WITH DIFFERENTIAL/PLATELET
Abs Immature Granulocytes: 0.04 10*3/uL (ref 0.00–0.07)
Basophils Absolute: 0 10*3/uL (ref 0.0–0.1)
Basophils Relative: 0 %
Eosinophils Absolute: 0.2 10*3/uL (ref 0.0–0.5)
Eosinophils Relative: 2 %
HCT: 38.3 % (ref 36.0–46.0)
Hemoglobin: 13.2 g/dL (ref 12.0–15.0)
Immature Granulocytes: 1 %
Lymphocytes Relative: 32 %
Lymphs Abs: 2.6 10*3/uL (ref 0.7–4.0)
MCH: 29.2 pg (ref 26.0–34.0)
MCHC: 34.5 g/dL (ref 30.0–36.0)
MCV: 84.7 fL (ref 80.0–100.0)
Monocytes Absolute: 0.6 10*3/uL (ref 0.1–1.0)
Monocytes Relative: 7 %
Neutro Abs: 4.6 10*3/uL (ref 1.7–7.7)
Neutrophils Relative %: 58 %
Platelets: 255 10*3/uL (ref 150–400)
RBC: 4.52 MIL/uL (ref 3.87–5.11)
RDW: 12.9 % (ref 11.5–15.5)
WBC: 8 10*3/uL (ref 4.0–10.5)
nRBC: 0 % (ref 0.0–0.2)

## 2022-06-02 NOTE — Progress Notes (Signed)
Hematology/Oncology Consult note [facility]  [phone] [phone]  Patient Care Team: McLean-Scocuzza, Nino Glow, MD as PCP - General (Internal Medicine)   Name of the patient: Christy Stout  [id number]  11-14-1950   Date of visit: 06/02/22  Diagnosis-hemangioma of the T12 vertebral body  Chief complaint/ Reason for visit-patient now referred for concern for lymphocytosis  Heme/Onc history: Patient is a 71 year old female last seen by me in July 2021 for abnormal MRI thoracic spine findings which she was ultimately found to be a benign hemangioma which did not require any further monitoring.  Presently patient has been referred to [place] for possible lymphocytosis.  Her white cell count has been normal between 7-8.  Hemoglobin and platelets have been normal.  Differential mainly shows relative low neutrophil percentage that fluctuates between 36 to 46%.  There appears to be relative lymphocytosis secondary to that.  Absolute neutrophil count as well as lymphocyte count is normal.  Patient reports no B symptoms.  Interval history-patient is currently doing well and denies any specific complaints at this time  ECOG PS- 1 Pain scale- 0 Opioid associated constipation- no  Review of systems- Review of Systems  Constitutional:  Negative for chills, fever, malaise/fatigue and weight loss.  HENT:  Negative for congestion, ear discharge and nosebleeds.   Eyes:  Negative for blurred vision.  Respiratory:  Negative for cough, hemoptysis, sputum production, shortness of breath and wheezing.   Cardiovascular:  Negative for chest pain, palpitations, orthopnea and claudication.  Gastrointestinal:  Negative for abdominal pain, blood in stool, constipation, diarrhea, heartburn, melena, nausea and vomiting.  Genitourinary:  Negative for dysuria, flank pain, frequency, hematuria and urgency.  Musculoskeletal:  Negative for back pain, joint pain and  myalgias.  Skin:  Negative for rash.  Neurological:  Negative for dizziness, tingling, focal weakness, seizures, weakness and headaches.  Endo/Heme/Allergies:  Does not bruise/bleed easily.  Psychiatric/Behavioral:  Negative for depression and suicidal ideas. The patient does not have insomnia.       Allergies  Allergen Reactions   Lisinopril     Cough      Past Medical History:  Diagnosis Date   Arthritis    Bacterial vaginosis    Breast cancer, right ([place]) 2000    lumpectomy 8 rounds chemo, 6 weeks radiation mammo 02/24/17 negative on Arimedex x 7 years    Cataract    COVID-19    04/13/21   Diabetes mellitus without complication (HCC)    Diverticulosis    GERD (gastroesophageal reflux disease)    History of chicken pox    Hyperlipidemia    Hypertension    Personal history of chemotherapy 2000   Rt. Breast   Personal history of radiation therapy 2000   Rt. breast   UTI (urinary tract infection)    UTI (urinary tract infection)    e coli 12/2020      Past Surgical History:  Procedure Laterality Date   BREAST EXCISIONAL BIOPSY Right 2000   +   BREAST SURGERY     2000   WRIST SURGERY Left     Social History   Socioeconomic History   Marital status: Widowed    Spouse name: Not on file   Number of children: Not on file   Years of education: Not on file   Highest education level: Not on file  Occupational History   Not on file  Tobacco Use   Smoking status: Never   Smokeless tobacco: Never  Substance  and Sexual Activity   Alcohol use: Yes    Comment: 2 drinks per month   Drug use: Not Currently   Sexual activity: Yes    Partners: Male  Other Topics Concern   Not on file  Social History Narrative   Twin sons 1 daughter    Married husband Marcello Moores x 20 years he died 31-Mar-2022       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, 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 also used to live in [place], from [place]   Never smoker    3 pregnancys 4 live births   No guns,  wears seat belt safe in relationship       Social Determinants of Health   Financial Resource Strain: Low Risk  (02/22/2022)   Overall Financial Resource Strain (CARDIA)    Difficulty of Paying Living Expenses: Not hard at all  Food Insecurity: No Food Insecurity (02/22/2022)   Hunger Vital Sign    Worried About Running Out of Food in the Last Year: Never true    [place] in the Last Year: Never true  Transportation Needs: No Transportation Needs (02/22/2022)   PRAPARE - Hydrologist (Medical): No    Lack of Transportation (Non-Medical): No  Physical Activity: Sufficiently Active (02/22/2022)   Exercise Vital Sign    Days of Exercise per Week: 5 days    Minutes of Exercise per Session: 30 min  Stress: No Stress Concern Present (02/22/2022)   [place]    Feeling of Stress : Not at all  Social Connections: Unknown (02/22/2022)   Social Connection and Isolation Panel [NHANES]    Frequency of Communication with Friends and Family: More than three times a week    Frequency of Social Gatherings with Friends and Family: More than three times a week    Attends Religious Services: Not on file    Active Member of Clubs or Organizations: Not on file    Attends Archivist Meetings: Not on file    Marital Status: Married  Intimate Partner Violence: Not At Risk (02/22/2022)   Humiliation, Afraid, Rape, and Kick questionnaire    Fear of Current or Ex-Partner: No    Emotionally Abused: No    Physically Abused: No    Sexually Abused: No    Family History  Problem Relation Age of Onset   Colon cancer Mother 17   Heart disease Father    Stroke Father    Arthritis Brother    Diabetes Brother    Breast cancer Maternal Grandmother    Heart disease Maternal Grandfather    Arthritis Brother    Alcohol abuse Son    Hypertension Son    Esophageal cancer Neg Hx    Stomach cancer Neg Hx     Liver disease Neg Hx      Current Outpatient Medications:    Accu-Chek Softclix Lancets lancets, Used to check blood sugars once per day. Softclix lancets or ask pt which she prefers, Disp: 100 each, Rfl: 4   amLODipine (NORVASC) 2.5 MG tablet, Take 1 tablet (2.5 mg total) by mouth daily., Disp: 90 tablet, Rfl: 3   aspirin EC 81 MG tablet, Take 81 mg by mouth daily., Disp: , Rfl:    blood glucose meter kit and supplies KIT, Dispense based on patient and insurance preference. Use up to four times daily as directed. (FOR ICD-9 250.00, 250.01)., Disp: 1  each, Rfl: 0   blood glucose meter kit and supplies, Dispense based on patient and insurance preference. Bid  directed. (FOR ICD-10 E10.9, E11.9). 1 year supply strips and lancets, Disp: 1 each, Rfl: 0   Blood Glucose Monitoring Suppl (ACCU-CHEK GUIDE) w/Device KIT, 1 Device by Does not apply route daily., Disp: 1 kit, Rfl: 0   busPIRone (BUSPAR) 7.5 MG tablet, Take 1 tablet (7.5 mg total) by mouth 2 (two) times daily., Disp: 180 tablet, Rfl: 1   citalopram (CELEXA) 10 MG tablet, Take 1 tablet (10 mg total) by mouth daily., Disp: 90 tablet, Rfl: 3   glipiZIDE (GLUCOTROL XL) 10 MG 24 hr tablet, Take 1 tablet (10 mg total) by mouth daily with breakfast., Disp: 90 tablet, Rfl: 3   glucose blood (ACCU-CHEK GUIDE) test strip, Used to check blood sugar once per day E11.9., Disp: 100 each, Rfl: 5   losartan-hydrochlorothiazide (HYZAAR) 100-25 MG tablet, Take 1 tablet by mouth daily. In am, Disp: 90 tablet, Rfl: 3   Magnesium 250 MG TABS, Take by mouth daily., Disp: , Rfl:    metFORMIN (GLUCOPHAGE-XR) 500 MG 24 hr tablet, Take 2 tablets (1,000 mg total) by mouth 2 (two) times daily with a meal., Disp: 360 tablet, Rfl: 3   Multiple Vitamin (MULTI-VITAMIN DAILY PO), Take by mouth., Disp: , Rfl:    Omega-3 Fatty Acids (FISH OIL PO), Take by mouth., Disp: , Rfl:    rosuvastatin (CRESTOR) 10 MG tablet, Take 1 tablet (10 mg total) by mouth at bedtime., Disp: 90  tablet, Rfl: 3   Semaglutide (RYBELSUS) 7 MG TABS, Take 1 tablet by mouth daily. In 1 month, Disp: 90 tablet, Rfl: 3   Vitamin D, Cholecalciferol, 1000 units CAPS, Take 2,000 Int'l Units/day by mouth. , Disp: , Rfl:    zolpidem (AMBIEN) 5 MG tablet, Take 1 tablet (5 mg total) by mouth at bedtime as needed for sleep., Disp: 90 tablet, Rfl: 1   meclizine (ANTIVERT) 12.5 MG tablet, Take 1 tablet (12.5 mg total) by mouth 2 (two) times daily as needed for dizziness. (Patient not taking: Reported on 06/02/2022), Disp: 60 tablet, Rfl: 0   meloxicam (MOBIC) 15 MG tablet, Take 1 tablet (15 mg total) by mouth daily as needed for pain., Disp: 90 tablet, Rfl: 3  Current Facility-Administered Medications:    0.9 %  sodium chloride infusion, 500 mL, Intravenous, Once, Doran Stabler, MD  Physical exam:  Vitals:   06/02/22 1118  BP: 134/71  Pulse: 78  Resp: 16  Temp: 98.2 F (36.8 C)  SpO2: 96%  Weight: 148 lb 3.2 oz (67.2 kg)   Physical Exam Constitutional:      General: She is not in acute distress. Cardiovascular:     Rate and Rhythm: Normal rate and regular rhythm.     Heart sounds: Normal heart sounds.  Pulmonary:     Effort: Pulmonary effort is normal.     Breath sounds: Normal breath sounds.  Abdominal:     General: Bowel sounds are normal.     Palpations: Abdomen is soft.     Comments: No palpable hepatosplenomegaly  Lymphadenopathy:     Comments: No palpable cervical, supraclavicular, axillary or inguinal adenopathy    Skin:    General: Skin is warm and dry.  Neurological:     Mental Status: She is alert and oriented to person, place, and time.         Latest Ref Rng & Units 02/19/2022    8:00 AM  CMP  Glucose 70 - 99 mg/dL 117   BUN 6 - 23 mg/dL 14   Creatinine 0.40 - 1.20 mg/dL 0.65   Sodium 135 - 145 mEq/L 135   Potassium 3.5 - 5.1 mEq/L 4.4   Chloride 96 - 112 mEq/L 97   CO2 19 - 32 mEq/L 29   Calcium 8.4 - 10.5 mg/dL 9.8   Total Protein 6.0 - 8.3 g/dL 7.4    Total Bilirubin 0.2 - 1.2 mg/dL 0.5   Alkaline Phos 39 - 117 U/L 38   AST 0 - 37 U/L 19   ALT 0 - 35 U/L 27       Latest Ref Rng & Units 06/02/2022   11:01 AM  CBC  WBC 4.0 - 10.5 K/uL 8.0   Hemoglobin 12.0 - 15.0 g/dL 13.2   Hematocrit 36.0 - 46.0 % 38.3   Platelets 150 - 400 K/uL 255     No images are attached to the encounter.  MM 3D SCREEN BREAST BILATERAL  Result Date: 05/19/2022 CLINICAL DATA:  Screening. RIGHT lumpectomy 2000 EXAM: DIGITAL SCREENING BILATERAL MAMMOGRAM WITH TOMOSYNTHESIS AND CAD TECHNIQUE: Bilateral screening digital craniocaudal and mediolateral oblique mammograms were obtained. Bilateral screening digital breast tomosynthesis was performed. The images were evaluated with computer-aided detection. Best images possible per technologist communication. COMPARISON:  Previous exam(s). ACR Breast Density Category b: There are scattered areas of fibroglandular density. FINDINGS: There are no findings suspicious for malignancy. There is density and architectural distortion within the RIGHT breast, consistent with postsurgical changes. These are stable in comparison to prior. IMPRESSION: No mammographic evidence of malignancy. A result letter of this screening mammogram will be mailed directly to the patient. RECOMMENDATION: Screening mammogram in one year. (Code:SM-B-01Y) BI-RADS CATEGORY  2: Benign. Electronically Signed   By: Valentino Saxon M.D.   On: 05/19/2022 15:49     Assessment and plan- Patient is a 71 y.o. female referred for possible lymphocytosis  Next to the patient that her baseline white cell count runs between 7-8.  Typically neutrophils make up 50 to 70% of her white cells.  In her case her neutrophil percentage fluctuates between 35 to 50%.  There appears to be relative lymphocytosis likely secondary to low proportion of neutrophils.  However both the absolute neutrophil count and lymphocyte count is normal.  Hemoglobin and platelets are normal.  I  therefore do not see any indication to pursue any further testing at this time.  Clinically patient has no B symptoms and otherwise feels well.  As long as her white count stays normal and neutrophil count stay more than 1.5 she does not require any hematology follow-up.  Her CBC can be monitored conservatively every 6 months to a year.  If there is a downward trend in her white cell count she can be referred to [place] in the future.   Visit Diagnosis 1. Other neutropenia ([place])      Dr. Randa Evens, MD, MPH [facility] at [facility] [phone] 06/02/2022 3:57 PM

## 2022-07-05 ENCOUNTER — Encounter: Payer: Self-pay | Admitting: Internal Medicine

## 2022-07-05 ENCOUNTER — Ambulatory Visit (INDEPENDENT_AMBULATORY_CARE_PROVIDER_SITE_OTHER): Payer: Medicare Other | Admitting: Internal Medicine

## 2022-07-05 VITALS — BP 122/64 | HR 83 | Temp 98.1°F | Ht 67.0 in | Wt 148.2 lb

## 2022-07-05 DIAGNOSIS — N3000 Acute cystitis without hematuria: Secondary | ICD-10-CM | POA: Diagnosis not present

## 2022-07-05 DIAGNOSIS — E785 Hyperlipidemia, unspecified: Secondary | ICD-10-CM

## 2022-07-05 DIAGNOSIS — R5383 Other fatigue: Secondary | ICD-10-CM

## 2022-07-05 DIAGNOSIS — I10 Essential (primary) hypertension: Secondary | ICD-10-CM | POA: Diagnosis not present

## 2022-07-05 DIAGNOSIS — E1165 Type 2 diabetes mellitus with hyperglycemia: Secondary | ICD-10-CM

## 2022-07-05 DIAGNOSIS — G4700 Insomnia, unspecified: Secondary | ICD-10-CM | POA: Diagnosis not present

## 2022-07-05 DIAGNOSIS — F4321 Adjustment disorder with depressed mood: Secondary | ICD-10-CM

## 2022-07-05 DIAGNOSIS — Z2911 Encounter for prophylactic immunotherapy for respiratory syncytial virus (RSV): Secondary | ICD-10-CM

## 2022-07-05 DIAGNOSIS — E119 Type 2 diabetes mellitus without complications: Secondary | ICD-10-CM

## 2022-07-05 DIAGNOSIS — E1159 Type 2 diabetes mellitus with other circulatory complications: Secondary | ICD-10-CM

## 2022-07-05 DIAGNOSIS — F4320 Adjustment disorder, unspecified: Secondary | ICD-10-CM

## 2022-07-05 DIAGNOSIS — F4323 Adjustment disorder with mixed anxiety and depressed mood: Secondary | ICD-10-CM | POA: Diagnosis not present

## 2022-07-05 DIAGNOSIS — I152 Hypertension secondary to endocrine disorders: Secondary | ICD-10-CM

## 2022-07-05 DIAGNOSIS — Z1329 Encounter for screening for other suspected endocrine disorder: Secondary | ICD-10-CM

## 2022-07-05 DIAGNOSIS — Z1231 Encounter for screening mammogram for malignant neoplasm of breast: Secondary | ICD-10-CM

## 2022-07-05 DIAGNOSIS — Z23 Encounter for immunization: Secondary | ICD-10-CM

## 2022-07-05 MED ORDER — GLIPIZIDE ER 10 MG PO TB24: 10.0000 mg | ORAL_TABLET | Freq: Every day | ORAL | 3 refills | Status: DC

## 2022-07-05 MED ORDER — AMLODIPINE BESYLATE 2.5 MG PO TABS: 2.5000 mg | ORAL_TABLET | Freq: Every day | ORAL | 3 refills | Status: DC

## 2022-07-05 MED ORDER — RSVPREF3 VAC RECOMB ADJUVANTED 120 MCG/0.5ML IM SUSR: 0.5000 mL | Freq: Once | INTRAMUSCULAR | 0 refills | Status: AC

## 2022-07-05 MED ORDER — RYBELSUS 7 MG PO TABS: 1.0000 | ORAL_TABLET | Freq: Every day | ORAL | 3 refills | Status: DC

## 2022-07-05 MED ORDER — CITALOPRAM HYDROBROMIDE 10 MG PO TABS
10.0000 mg | ORAL_TABLET | Freq: Every day | ORAL | 3 refills | Status: DC
Start: 2022-07-05 — End: 2023-09-09

## 2022-07-05 MED ORDER — BUSPIRONE HCL 7.5 MG PO TABS: 7.5000 mg | ORAL_TABLET | Freq: Two times a day (BID) | ORAL | 3 refills | Status: DC

## 2022-07-05 MED ORDER — ZOLPIDEM TARTRATE 5 MG PO TABS: 5.0000 mg | ORAL_TABLET | Freq: Every evening | ORAL | 1 refills | Status: DC | PRN

## 2022-07-05 MED ORDER — ROSUVASTATIN CALCIUM 10 MG PO TABS: 10.0000 mg | ORAL_TABLET | Freq: Every day | ORAL | 3 refills | Status: DC

## 2022-07-05 MED ORDER — METFORMIN HCL ER 500 MG PO TB24: 1000.0000 mg | ORAL_TABLET | Freq: Two times a day (BID) | ORAL | 3 refills | Status: DC

## 2022-07-05 MED ORDER — LOSARTAN POTASSIUM-HCTZ 100-25 MG PO TABS: 1.0000 | ORAL_TABLET | Freq: Every day | ORAL | 3 refills | Status: DC

## 2022-07-05 NOTE — Progress Notes (Signed)
Chief Complaint  Patient presents with   Follow-up    F/u   F/u  1. Dm 2 controlled 02/19/22 a1c 6.0  Htn controlled on norvasc 2.5 mg qd glucotrol xl 10 mg qd hyzaar 100-25 hct, metformin xr 500 xr, crestor 10 mg qd, rybelsus qd  2. Chronic insomnia on 5 mg qhs     Review of Systems  Constitutional:  Negative for weight loss.  HENT:  Negative for hearing loss.   Eyes:  Negative for blurred vision.  Respiratory:  Negative for shortness of breath.   Cardiovascular:  Negative for chest pain.  Gastrointestinal:  Negative for abdominal pain and blood in stool.  Genitourinary:  Negative for dysuria.  Musculoskeletal:  Negative for falls and joint pain.  Skin:  Negative for rash.  Neurological:  Negative for headaches.  Psychiatric/Behavioral:  Negative for depression.    Past Medical History:  Diagnosis Date   Arthritis    Bacterial vaginosis    Breast cancer, right ([place]) 2000    lumpectomy 8 rounds chemo, 6 weeks radiation mammo 02/24/17 negative on Arimedex x 7 years    Cataract    COVID-19    04/13/21   Diabetes mellitus without complication (HCC)    Diverticulosis    GERD (gastroesophageal reflux disease)    History of chicken pox    Hyperlipidemia    Hypertension    Personal history of chemotherapy 2000   Rt. Breast   Personal history of radiation therapy 2000   Rt. breast   UTI (urinary tract infection)    UTI (urinary tract infection)    e coli 12/2020    Past Surgical History:  Procedure Laterality Date   BREAST EXCISIONAL BIOPSY Right 2000   +   BREAST SURGERY     2000   WRIST SURGERY Left    Family History  Problem Relation Age of Onset   Colon cancer Mother 41   Heart disease Father    Stroke Father    Arthritis Brother    Diabetes Brother    Breast cancer Maternal Grandmother    Heart disease Maternal Grandfather    Arthritis Brother    Alcohol abuse Son    Hypertension Son    Esophageal cancer Neg Hx    Stomach cancer Neg Hx    Liver disease  Neg Hx    Social History   Socioeconomic History   Marital status: Widowed    Spouse name: Not on file   Number of children: Not on file   Years of education: Not on file   Highest education level: Not on file  Occupational History   Not on file  Tobacco Use   Smoking status: Never   Smokeless tobacco: Never  Substance and Sexual Activity   Alcohol use: Yes    Comment: 2 drinks per month   Drug use: Not Currently   Sexual activity: Yes    Partners: Male  Other Topics Concern   Not on file  Social History Narrative   Twin sons 1 daughter    Married husband Marcello Moores x 20 years he died 04/15/22       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, 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 also used to live in [place], from [place]   Never smoker    3 pregnancys 4 live births   No guns, wears seat belt safe in relationship       Social Determinants of Radio broadcast assistant Strain: Low  Risk  (02/22/2022)   Overall Financial Resource Strain (CARDIA)    Difficulty of Paying Living Expenses: Not hard at all  Food Insecurity: No Food Insecurity (02/22/2022)   Hunger Vital Sign    Worried About Running Out of Food in the Last Year: Never true    Ran Out of Food in the Last Year: Never true  Transportation Needs: No Transportation Needs (02/22/2022)   PRAPARE - Hydrologist (Medical): No    Lack of Transportation (Non-Medical): No  Physical Activity: Sufficiently Active (02/22/2022)   Exercise Vital Sign    Days of Exercise per Week: 5 days    Minutes of Exercise per Session: 30 min  Stress: No Stress Concern Present (02/22/2022)   [place]    Feeling of Stress : Not at all  Social Connections: Unknown (02/22/2022)   Social Connection and Isolation Panel [NHANES]    Frequency of Communication with Friends and Family: More than three times a week    Frequency of Social Gatherings with Friends and  Family: More than three times a week    Attends Religious Services: Not on file    Active Member of Clubs or Organizations: Not on file    Attends Archivist Meetings: Not on file    Marital Status: Married  Intimate Partner Violence: Not At Risk (02/22/2022)   Humiliation, Afraid, Rape, and Kick questionnaire    Fear of Current or Ex-Partner: No    Emotionally Abused: No    Physically Abused: No    Sexually Abused: No   Current Meds  Medication Sig   Accu-Chek Softclix Lancets lancets Used to check blood sugars once per day. Softclix lancets or ask pt which she prefers   aspirin EC 81 MG tablet Take 81 mg by mouth daily.   blood glucose meter kit and supplies KIT Dispense based on patient and insurance preference. Use up to four times daily as directed. (FOR ICD-9 250.00, 250.01).   blood glucose meter kit and supplies Dispense based on patient and insurance preference. Bid  directed. (FOR ICD-10 E10.9, E11.9). 1 year supply strips and lancets   Blood Glucose Monitoring Suppl (ACCU-CHEK GUIDE) w/Device KIT 1 Device by Does not apply route daily.   glucose blood (ACCU-CHEK GUIDE) test strip Used to check blood sugar once per day E11.9.   Magnesium 250 MG TABS Take by mouth daily.   meclizine (ANTIVERT) 12.5 MG tablet Take 1 tablet (12.5 mg total) by mouth 2 (two) times daily as needed for dizziness.   meloxicam (MOBIC) 15 MG tablet Take 1 tablet (15 mg total) by mouth daily as needed for pain.   Multiple Vitamin (MULTI-VITAMIN DAILY PO) Take by mouth.   Omega-3 Fatty Acids (FISH OIL PO) Take by mouth.   RSV vaccine recomb adjuvanted (AREXVY) 120 MCG/0.5ML injection Inject 0.5 mLs into the muscle once for 1 dose.   Vitamin D, Cholecalciferol, 1000 units CAPS Take 2,000 Int'l Units/day by mouth.    [DISCONTINUED] amLODipine (NORVASC) 2.5 MG tablet Take 1 tablet (2.5 mg total) by mouth daily.   [DISCONTINUED] busPIRone (BUSPAR) 7.5 MG tablet Take 1 tablet (7.5 mg total) by mouth 2  (two) times daily.   [DISCONTINUED] citalopram (CELEXA) 10 MG tablet Take 1 tablet (10 mg total) by mouth daily.   [DISCONTINUED] glipiZIDE (GLUCOTROL XL) 10 MG 24 hr tablet Take 1 tablet (10 mg total) by mouth daily with breakfast.   [DISCONTINUED] losartan-hydrochlorothiazide (  HYZAAR) 100-25 MG tablet Take 1 tablet by mouth daily. In am   [DISCONTINUED] metFORMIN (GLUCOPHAGE-XR) 500 MG 24 hr tablet Take 2 tablets (1,000 mg total) by mouth 2 (two) times daily with a meal.   [DISCONTINUED] rosuvastatin (CRESTOR) 10 MG tablet Take 1 tablet (10 mg total) by mouth at bedtime.   [DISCONTINUED] Semaglutide (RYBELSUS) 7 MG TABS Take 1 tablet by mouth daily. In 1 month   [DISCONTINUED] zolpidem (AMBIEN) 5 MG tablet Take 1 tablet (5 mg total) by mouth at bedtime as needed for sleep.   Current Facility-Administered Medications for the 07/05/22 encounter (Office Visit) with McLean-Scocuzza, Nino Glow, MD  Medication   0.9 %  sodium chloride infusion   Allergies  Allergen Reactions   Lisinopril     Cough    Recent Results (from the past 2160 hour(s))  CBC with Differential/Platelet     Status: None   Collection Time: 06/02/22 11:01 AM  Result Value Ref Range   WBC 8.0 4.0 - 10.5 K/uL   RBC 4.52 3.87 - 5.11 MIL/uL   Hemoglobin 13.2 12.0 - 15.0 g/dL   HCT 38.3 36.0 - 46.0 %   MCV 84.7 80.0 - 100.0 fL   MCH 29.2 26.0 - 34.0 pg   MCHC 34.5 30.0 - 36.0 g/dL   RDW 12.9 11.5 - 15.5 %   Platelets 255 150 - 400 K/uL   nRBC 0.0 0.0 - 0.2 %   Neutrophils Relative % 58 %   Neutro Abs 4.6 1.7 - 7.7 K/uL   Lymphocytes Relative 32 %   Lymphs Abs 2.6 0.7 - 4.0 K/uL   Monocytes Relative 7 %   Monocytes Absolute 0.6 0.1 - 1.0 K/uL   Eosinophils Relative 2 %   Eosinophils Absolute 0.2 0.0 - 0.5 K/uL   Basophils Relative 0 %   Basophils Absolute 0.0 0.0 - 0.1 K/uL   Immature Granulocytes 1 %   Abs Immature Granulocytes 0.04 0.00 - 0.07 K/uL    Comment: Performed at [facility], [address] [place], [place] [address]   Objective  Body mass index is 23.21 kg/m. Wt Readings from Last 3 Encounters:  07/05/22 148 lb 3.2 oz (67.2 kg)  06/02/22 148 lb 3.2 oz (67.2 kg)  04/13/22 148 lb 3.2 oz (67.2 kg)   Temp Readings from Last 3 Encounters:  07/05/22 98.1 F (36.7 C) (Oral)  06/02/22 98.2 F (36.8 C)  04/13/22 98.9 F (37.2 C) (Oral)   BP Readings from Last 3 Encounters:  07/05/22 122/64  06/02/22 134/71  04/13/22 130/80   Pulse Readings from Last 3 Encounters:  07/05/22 83  06/02/22 78  04/13/22 81    Physical Exam Vitals and nursing note reviewed.  Constitutional:      Appearance: Normal appearance. She is well-developed and well-groomed.  HENT:     Head: Normocephalic and atraumatic.  Eyes:     Conjunctiva/sclera: Conjunctivae normal.     Pupils: Pupils are equal, round, and reactive to light.  Cardiovascular:     Rate and Rhythm: Normal rate and regular rhythm.     Heart sounds: Normal heart sounds. No murmur heard. Pulmonary:     Effort: Pulmonary effort is normal.     Breath sounds: Normal breath sounds.  Abdominal:     General: Abdomen is flat. Bowel sounds are normal.     Tenderness: There is no abdominal tenderness.  Musculoskeletal:        General: No tenderness.  Skin:  General: Skin is warm and dry.  Neurological:     General: No focal deficit present.     Mental Status: She is alert and oriented to person, place, and time. Mental status is at baseline.     Cranial Nerves: Cranial nerves 2-12 are intact.     Motor: Motor function is intact.     Coordination: Coordination is intact.     Gait: Gait is intact.  Psychiatric:        Attention and Perception: Attention and perception normal.        Mood and Affect: Mood and affect normal.        Speech: Speech normal.        Behavior: Behavior normal. Behavior is cooperative.        Thought Content: Thought content normal.        Cognition and Memory: Cognition and memory normal.         Judgment: Judgment normal.     Assessment  Plan  Hypertension associated with diabetes ([place]) - Plan: Comprehensive metabolic panel, Lipid panel, CBC with Differential/Platelet, Hemoglobin A1c, Microalbumin / creatinine urine ratio, Semaglutide (RYBELSUS) 7 MG TABS, metFORMIN (GLUCOPHAGE-XR) 500 MG 24 hr tablet norvasc 2.5 mg qd glucotrol xl 10 mg qd hyzaar 100-25 hct, metformin xr 500 xr, crestor 10 mg qd, rybelsus qd    Grief  these meds are helping her- Plan: citalopram (CELEXA) 10 MG tablet, busPIRone (BUSPAR) 7.5 MG tablet Grief reaction - Plan: citalopram (CELEXA) 10 MG tablet, busPIRone (BUSPAR) 7.5 MG tablet Adjustment disorder with mixed anxiety and depressed mood - Plan: citalopram (CELEXA) 10 MG tablet, busPIRone (BUSPAR) 7.5 MG tablet   Insomnia, unspecified type - Plan: zolpidem (AMBIEN) 5 MG tablet   HM Flu shot given today high dose prevnar had 02/02/17 pna 23 utd   Prevnar 20 due in 5 years 03/17/23 MMR NOT immune mumps consider vaccine covid vaccine 5/5  moderna 4 2/2 shingrix  Tdap 11/08/15   hep A/B consider  -consider in future fatty liver not immune Hep C negative 12/17/16    PCP per pt had 05/2014 colonoscopy normal mom +colon cancer had another    colonoscopy 11/10/20 [facility] with polyps tubular x 3 f/u in 3 years rec 11/2023    DEXA 03/16/17 normal scanned in chart    Mammogram 05/11/21 negative ordered call and schedule Mammogram 05/18/22 negative ordered 2024    Out of age window pap no records from prior PCP    [facility] Dr. Kellie Moor h/o [facility]  seen appt 10/2021   Ph[facility] -long term meds   Eye surgery tbd ? scheduled 11/08/19 pt moved back later date  [facility]  As of 01/26/22 [facility] to [facility] and fax report to me    Former pcp Judithe Modest NP [place] [facility] [phone] disc colonoscopy prev report    H/o Dr. Janese Banks Provider: Dr. Olivia Mackie McLean-Scocuzza-Internal Medicine

## 2022-07-05 NOTE — Patient Instructions (Addendum)
High dose flu shot given today   Colonoscopy due 11/11/2023    Prevnar 20 due 03/17/23 Pneumococcal Conjugate Vaccine (Prevnar 20) Suspension for Injection What is this medication? PNEUMOCOCCAL VACCINE (NEU mo KOK al vak SEEN) is a vaccine. It prevents pneumococcus bacterial infections. These bacteria can cause serious infections like pneumonia, meningitis, and blood infections. This vaccine will not treat an infection and will not cause infection. This vaccine is recommended for adults 18 years and older. This medicine may be used for other purposes; ask your health care provider or pharmacist if you have questions. COMMON BRAND NAME(S): Prevnar 20 What should I tell my care team before I take this medication? They need to know if you have any of these conditions: bleeding disorder fever immune system problems an unusual or allergic reaction to pneumococcal vaccine, diphtheria toxoid, other vaccines, other medicines, foods, dyes, or preservatives pregnant or trying to get pregnant breast-feeding How should I use this medication? This vaccine is injected into a muscle. It is given by a health care provider. A copy of Vaccine Information Statements will be given before each vaccination. Be sure to read this information carefully each time. This sheet may change often. Talk to your health care provider about the use of this medicine in children. Special care may be needed. Overdosage: If you think you have taken too much of this medicine contact a poison control center or emergency room at once. NOTE: This medicine is only for you. Do not share this medicine with others. What if I miss a dose? This does not apply. This medicine is not for regular use. What may interact with this medication? medicines for cancer chemotherapy medicines that suppress your immune function steroid medicines like prednisone or cortisone This list may not describe all possible interactions. Give your health care  provider a list of all the medicines, herbs, non-prescription drugs, or dietary supplements you use. Also tell them if you smoke, drink alcohol, or use illegal drugs. Some items may interact with your medicine. What should I watch for while using this medication? Mild fever and pain should go away in 3 days or less. Report any unusual symptoms to your health care provider. What side effects may I notice from receiving this medication? Side effects that you should report to your doctor or health care professional as soon as possible: allergic reactions (skin rash, itching or hives; swelling of the face, lips, or tongue) confusion fast, irregular heartbeat fever over 102 degrees F muscle weakness seizures trouble breathing unusual bruising or bleeding Side effects that usually do not require medical attention (report to your doctor or health care professional if they continue or are bothersome): fever of 102 degrees F or less headache joint pain muscle cramps, pain pain, tender at site where injected This list may not describe all possible side effects. Call your doctor for medical advice about side effects. You may report side effects to FDA at 1-800-FDA-1088. Where should I keep my medication? This vaccine is only given by a health care provider. It will not be stored at home. NOTE: This sheet is a summary. It may not cover all possible information. If you have questions about this medicine, talk to your doctor, pharmacist, or health care provider.  2023 Elsevier/Gold Standard (2020-05-23 00:00:00)

## 2022-07-08 ENCOUNTER — Ambulatory Visit: Payer: Medicare Other | Admitting: Internal Medicine

## 2022-07-16 DIAGNOSIS — H43813 Vitreous degeneration, bilateral: Secondary | ICD-10-CM | POA: Diagnosis not present

## 2022-07-28 ENCOUNTER — Ambulatory Visit: Payer: Medicare Other | Admitting: Internal Medicine

## 2022-09-02 ENCOUNTER — Other Ambulatory Visit (INDEPENDENT_AMBULATORY_CARE_PROVIDER_SITE_OTHER): Payer: Medicare Other

## 2022-09-02 DIAGNOSIS — Z1329 Encounter for screening for other suspected endocrine disorder: Secondary | ICD-10-CM

## 2022-09-02 DIAGNOSIS — R5383 Other fatigue: Secondary | ICD-10-CM | POA: Diagnosis not present

## 2022-09-02 DIAGNOSIS — E1159 Type 2 diabetes mellitus with other circulatory complications: Secondary | ICD-10-CM

## 2022-09-02 DIAGNOSIS — I152 Hypertension secondary to endocrine disorders: Secondary | ICD-10-CM | POA: Diagnosis not present

## 2022-09-02 DIAGNOSIS — N3000 Acute cystitis without hematuria: Secondary | ICD-10-CM | POA: Diagnosis not present

## 2022-09-02 LAB — COMPREHENSIVE METABOLIC PANEL
ALT: 17 U/L (ref 0–35)
AST: 13 U/L (ref 0–37)
Albumin: 4.6 g/dL (ref 3.5–5.2)
Alkaline Phosphatase: 42 U/L (ref 39–117)
BUN: 19 mg/dL (ref 6–23)
CO2: 30 mEq/L (ref 19–32)
Calcium: 9.5 mg/dL (ref 8.4–10.5)
Chloride: 98 mEq/L (ref 96–112)
Creatinine, Ser: 0.68 mg/dL (ref 0.40–1.20)
GFR: 87.5 mL/min (ref >60.00)
Glucose, Bld: 131 mg/dL — ABNORMAL HIGH (ref 70–99)
Potassium: 4.2 mEq/L (ref 3.5–5.1)
Sodium: 136 mEq/L (ref 135–145)
Total Bilirubin: 0.4 mg/dL (ref 0.2–1.2)
Total Protein: 7 g/dL (ref 6.0–8.3)

## 2022-09-02 LAB — CBC WITH DIFFERENTIAL/PLATELET
Basophils Absolute: 0.1 10*3/uL (ref 0.0–0.1)
Basophils Relative: 0.8 % (ref 0.0–3.0)
Eosinophils Absolute: 0.2 10*3/uL (ref 0.0–0.7)
Eosinophils Relative: 3.4 % (ref 0.0–5.0)
HCT: 38.3 % (ref 36.0–46.0)
Hemoglobin: 13 g/dL (ref 12.0–15.0)
Lymphocytes Relative: 32.3 % (ref 12.0–46.0)
Lymphs Abs: 2 10*3/uL (ref 0.7–4.0)
MCHC: 34.1 g/dL (ref 30.0–36.0)
MCV: 84.5 fl (ref 78.0–100.0)
Monocytes Absolute: 0.8 10*3/uL (ref 0.1–1.0)
Monocytes Relative: 13 % — ABNORMAL HIGH (ref 3.0–12.0)
Neutro Abs: 3.2 10*3/uL (ref 1.4–7.7)
Neutrophils Relative %: 50.5 % (ref 43.0–77.0)
Platelets: 255 10*3/uL (ref 150.0–400.0)
RBC: 4.53 Mil/uL (ref 3.87–5.11)
RDW: 13.5 % (ref 11.5–15.5)
WBC: 6.3 10*3/uL (ref 4.0–10.5)

## 2022-09-02 LAB — HEMOGLOBIN A1C: Hgb A1c MFr Bld: 6.1 % (ref 4.6–6.5)

## 2022-09-02 LAB — LIPID PANEL
Cholesterol: 116 mg/dL (ref 0–200)
HDL: 45.8 mg/dL (ref >39.00)
LDL Cholesterol: 44 mg/dL (ref 0–99)
NonHDL: 70.4
Total CHOL/HDL Ratio: 3
Triglycerides: 133 mg/dL (ref 0.0–149.0)
VLDL: 26.6 mg/dL (ref 0.0–40.0)

## 2022-09-02 LAB — TSH: TSH: 2.37 u[IU]/mL (ref 0.35–5.50)

## 2022-09-04 LAB — URINALYSIS, ROUTINE W REFLEX MICROSCOPIC
Bilirubin Urine: NEGATIVE
Glucose, UA: NEGATIVE
Hgb urine dipstick: NEGATIVE
Ketones, ur: NEGATIVE
Leukocytes,Ua: NEGATIVE
Nitrite: NEGATIVE
Protein, ur: NEGATIVE
Specific Gravity, Urine: 1.022 (ref 1.001–1.035)
pH: 5.5 (ref 5.0–8.0)

## 2022-09-04 LAB — URINE CULTURE
MICRO NUMBER:: 14252312
SPECIMEN QUALITY:: ADEQUATE

## 2022-09-04 LAB — MICROALBUMIN / CREATININE URINE RATIO
Creatinine, Urine: 166 mg/dL (ref 20–275)
Microalb Creat Ratio: 7 mcg/mg creat (ref <30)
Microalb, Ur: 1.2 mg/dL

## 2022-09-06 DIAGNOSIS — H2512 Age-related nuclear cataract, left eye: Secondary | ICD-10-CM | POA: Diagnosis not present

## 2022-09-06 LAB — HM DIABETES EYE EXAM

## 2022-10-11 ENCOUNTER — Encounter: Payer: Self-pay | Admitting: Ophthalmology

## 2022-10-18 NOTE — Discharge Instructions (Signed)
   Cataract Surgery, Care After ? ?This sheet gives you information about how to care for yourself after your surgery.  Your ophthalmologist may also give you more specific instructions.  If you have problems or questions, contact your doctor at [facility], [phone]. ? ?What can I expect after the surgery? ?It is common to have: ?Itching ?Foreign body sensation (feels like a grain of sand in the eye) ?Watery discharge (excess tearing) ?Sensitivity to light and touch ?Bruising in or around the eye ?Mild blurred vision ? ?Follow these instructions at home: ?Do not touch or rub your eyes. ?You may be told to wear a protective shield or sunglasses to protect your eyes. ?Do not put a contact lens in the operative eye unless your doctor approves. ?Keep the lids and face clean and dry. ?Do not allow water to hit you directly in the face while showering. ?Keep soap and shampoo out of your eyes. ?Do not use eye makeup for 1 week. ? ?Check your eye every day for signs of infection.  Watch for: ?Redness, swelling, or pain. ?Fluid, blood or pus. ?Worsening vision. ?Worsening sensitivity to light or touch. ? ?Activity: ?During the first day, avoid bending over and reading.  You may resume reading and bending the next day. ?Do not drive or use heavy machinery for at least 24 hours. ?Avoid strenuous activities for 1 week.  Activities such as walking, treadmill, exercise bike, and climbing stairs are okay. ?Do not lift heavy (>20 pound) objects for 1 week. ?Do not do yardwork, gardening, or dirty housework (mopping, cleaning bathrooms, vacuuming, etc.) for 1 week. ?Do not swim or use a hot tub for 2 weeks. ?Ask your doctor when you can return to work. ? ?General Instructions: ?Take or apply prescription and over-the-counter medicines as directed by your doctor, including eyedrops and ointments. ?Resume medications discontinued prior to surgery, unless told otherwise by your doctor. ?Keep all follow up appointments as  scheduled. ? ?Contact a health care provider if: ?You have increased bruising around your eye. ?You have pain that is not helped with medication. ?You have a fever. ?You have fluid, pus, or blood coming from your eye or incision. ?Your sensitivity to light gets worse. ?You have spots (floaters) of flashing lights in your vision. ?You have nausea or vomiting. ? ?Go to the nearest emergency room or call 911 if: ?You have sudden loss of vision. ?You have severe, worsening eye pain. ? ?

## 2022-10-20 ENCOUNTER — Ambulatory Visit: Payer: Medicare Other | Admitting: Anesthesiology

## 2022-10-20 ENCOUNTER — Other Ambulatory Visit: Payer: Self-pay

## 2022-10-20 ENCOUNTER — Encounter
Admission: RE | Disposition: A | Discharge status: Home / Self Care | Payer: Self-pay | Source: Ambulatory Visit | Attending: Ophthalmology

## 2022-10-20 ENCOUNTER — Ambulatory Visit
Admission: RE | Admit: 2022-10-20 | Discharge: 2022-10-20 | Disposition: A | Discharge status: Home / Self Care | Payer: Medicare Other | Source: Ambulatory Visit | Attending: Ophthalmology | Admitting: Ophthalmology

## 2022-10-20 ENCOUNTER — Encounter: Payer: Self-pay | Admitting: Ophthalmology

## 2022-10-20 DIAGNOSIS — I2510 Atherosclerotic heart disease of native coronary artery without angina pectoris: Secondary | ICD-10-CM | POA: Diagnosis not present

## 2022-10-20 DIAGNOSIS — K219 Gastro-esophageal reflux disease without esophagitis: Secondary | ICD-10-CM | POA: Insufficient documentation

## 2022-10-20 DIAGNOSIS — I10 Essential (primary) hypertension: Secondary | ICD-10-CM | POA: Insufficient documentation

## 2022-10-20 DIAGNOSIS — E1136 Type 2 diabetes mellitus with diabetic cataract: Secondary | ICD-10-CM | POA: Insufficient documentation

## 2022-10-20 DIAGNOSIS — Z7984 Long term (current) use of oral hypoglycemic drugs: Secondary | ICD-10-CM | POA: Insufficient documentation

## 2022-10-20 DIAGNOSIS — Z853 Personal history of malignant neoplasm of breast: Secondary | ICD-10-CM | POA: Insufficient documentation

## 2022-10-20 DIAGNOSIS — H2512 Age-related nuclear cataract, left eye: Secondary | ICD-10-CM | POA: Insufficient documentation

## 2022-10-20 DIAGNOSIS — Z923 Personal history of irradiation: Secondary | ICD-10-CM | POA: Diagnosis not present

## 2022-10-20 DIAGNOSIS — Z79899 Other long term (current) drug therapy: Secondary | ICD-10-CM | POA: Diagnosis not present

## 2022-10-20 DIAGNOSIS — E785 Hyperlipidemia, unspecified: Secondary | ICD-10-CM | POA: Diagnosis not present

## 2022-10-20 DIAGNOSIS — M1990 Unspecified osteoarthritis, unspecified site: Secondary | ICD-10-CM | POA: Diagnosis not present

## 2022-10-20 DIAGNOSIS — I25119 Atherosclerotic heart disease of native coronary artery with unspecified angina pectoris: Secondary | ICD-10-CM | POA: Diagnosis not present

## 2022-10-20 DIAGNOSIS — Z9221 Personal history of antineoplastic chemotherapy: Secondary | ICD-10-CM | POA: Insufficient documentation

## 2022-10-20 DIAGNOSIS — Z7985 Long-term (current) use of injectable non-insulin antidiabetic drugs: Secondary | ICD-10-CM | POA: Diagnosis not present

## 2022-10-20 HISTORY — PX: CATARACT EXTRACTION W/PHACO: SHX586

## 2022-10-20 LAB — GLUCOSE, CAPILLARY: Glucose-Capillary: 129 mg/dL — ABNORMAL HIGH (ref 70–99)

## 2022-10-20 SURGERY — PHACOEMULSIFICATION, CATARACT, WITH IOL INSERTION
Anesthesia: Monitor Anesthesia Care | Site: Eye | Laterality: Left

## 2022-10-20 MED ORDER — ARMC OPHTHALMIC DILATING DROPS
1.0000 | OPHTHALMIC | Status: DC | PRN
  Administered 2022-10-20 (×3): 1 via OPHTHALMIC

## 2022-10-20 MED ORDER — MIDAZOLAM HCL 2 MG/2ML IJ SOLN
INTRAMUSCULAR | Status: DC | PRN
  Administered 2022-10-20: 1 mg via INTRAVENOUS

## 2022-10-20 MED ORDER — SIGHTPATH DOSE#1 BSS IO SOLN
INTRAOCULAR | Status: DC | PRN
  Administered 2022-10-20: 70 mL via OPHTHALMIC

## 2022-10-20 MED ORDER — SIGHTPATH DOSE#1 BSS IO SOLN
INTRAOCULAR | Status: DC | PRN
  Administered 2022-10-20: 15 mL

## 2022-10-20 MED ORDER — LACTATED RINGERS IV SOLN: INTRAVENOUS | Status: DC

## 2022-10-20 MED ORDER — FENTANYL CITRATE (PF) 100 MCG/2ML IJ SOLN
INTRAMUSCULAR | Status: DC | PRN
  Administered 2022-10-20: 50 ug via INTRAVENOUS

## 2022-10-20 MED ORDER — CEFUROXIME OPHTHALMIC INJECTION 1 MG/0.1 ML
INJECTION | OPHTHALMIC | Status: DC | PRN
  Administered 2022-10-20: .1 mL via INTRACAMERAL

## 2022-10-20 MED ORDER — SIGHTPATH DOSE#1 NA HYALUR & NA CHOND-NA HYALUR IO KIT
PACK | INTRAOCULAR | Status: DC | PRN
  Administered 2022-10-20: 1 via OPHTHALMIC

## 2022-10-20 MED ORDER — BRIMONIDINE TARTRATE-TIMOLOL 0.2-0.5 % OP SOLN
OPHTHALMIC | Status: DC | PRN
  Administered 2022-10-20: 1 [drp] via OPHTHALMIC

## 2022-10-20 MED ORDER — TETRACAINE HCL 0.5 % OP SOLN
1.0000 [drp] | OPHTHALMIC | Status: DC | PRN
  Administered 2022-10-20 (×3): 1 [drp] via OPHTHALMIC

## 2022-10-20 MED ORDER — SIGHTPATH DOSE#1 BSS IO SOLN
INTRAOCULAR | Status: DC | PRN
  Administered 2022-10-20: 1 mL

## 2022-10-20 SURGICAL SUPPLY — 21 items
CANNULA ANT/CHMB 27G (MISCELLANEOUS) IMPLANT
CANNULA ANT/CHMB 27GA (MISCELLANEOUS) IMPLANT
CATARACT SUITE SIGHTPATH (MISCELLANEOUS) ×1 IMPLANT
FEE CATARACT SUITE SIGHTPATH (MISCELLANEOUS) ×1 IMPLANT
GLOVE SRG 8 PF TXTR STRL LF DI (GLOVE) ×1 IMPLANT
GLOVE SURG ENC TEXT LTX SZ7.5 (GLOVE) ×1 IMPLANT
GLOVE SURG GAMMEX PI TX LF 7.5 (GLOVE) IMPLANT
GLOVE SURG UNDER POLY LF SZ8 (GLOVE) ×1
LENS CLAREON VIVITY IOL 23.0 ×1 IMPLANT
LENS IOL CLRN VT YLW 23.0 IMPLANT
NDL FILTER BLUNT 18X1 1/2 (NEEDLE) ×1 IMPLANT
NDL RETROBULBAR .5 NSTRL (NEEDLE) IMPLANT
NEEDLE FILTER BLUNT 18X1 1/2 (NEEDLE) ×1 IMPLANT
PACK VIT ANT 23G (MISCELLANEOUS) IMPLANT
RING MALYGIN 7.0 (MISCELLANEOUS) IMPLANT
SUT ETHILON 10-0 CS-B-6CS-B-6 (SUTURE)
SUT VICRYL  9 0 (SUTURE)
SUT VICRYL 9 0 (SUTURE) IMPLANT
SUTURE EHLN 10-0 CS-B-6CS-B-6 (SUTURE) IMPLANT
SYR 3ML LL SCALE MARK (SYRINGE) ×1 IMPLANT
WATER STERILE IRR 250ML POUR (IV SOLUTION) ×1 IMPLANT

## 2022-10-20 NOTE — H&P (Signed)
[facility]   Primary Care Physician:  Carollee Leitz, MD Ophthalmologist: Dr. Leandrew Koyanagi  Pre-Procedure History & Physical: HPI:  Christy Stout is a 72 y.o. female here for ophthalmic surgery.   Past Medical History:  Diagnosis Date   Arthritis    Bacterial vaginosis    Breast cancer, right ([place]) 2000    lumpectomy 8 rounds chemo, 6 weeks radiation mammo 02/24/17 negative on Arimedex x 7 years    Cataract    COVID-19    04/13/21   Diabetes mellitus without complication (HCC)    Diverticulosis    GERD (gastroesophageal reflux disease)    History of chicken pox    Hyperlipidemia    Hypertension    Personal history of chemotherapy 2000   Rt. Breast   Personal history of radiation therapy 2000   Rt. breast   UTI (urinary tract infection)    UTI (urinary tract infection)    e coli 12/2020     Past Surgical History:  Procedure Laterality Date   BREAST EXCISIONAL BIOPSY Right 2000   +   BREAST SURGERY     2000   WRIST SURGERY Left     Prior to Admission medications   Medication Sig Start Date End Date Taking? Authorizing Provider  Accu-Chek Softclix Lancets lancets Used to check blood sugars once per day. Softclix lancets or ask pt which she prefers 11/30/19  Yes McLean-Scocuzza, Nino Glow, MD  amLODipine (NORVASC) 2.5 MG tablet Take 1 tablet (2.5 mg total) by mouth daily. 07/05/22  Yes McLean-Scocuzza, Nino Glow, MD  aspirin EC 81 MG tablet Take 81 mg by mouth daily.   Yes [provider]  blood glucose meter kit and supplies KIT Dispense based on patient and insurance preference. Use up to four times daily as directed. (FOR ICD-9 250.00, 250.01). 12/05/19  Yes McLean-Scocuzza, Nino Glow, MD  blood glucose meter kit and supplies Dispense based on patient and insurance preference. Bid  directed. (FOR ICD-10 E10.9, E11.9). 1 year supply strips and lancets 11/19/19  Yes McLean-Scocuzza, Nino Glow, MD  Blood Glucose Monitoring Suppl (ACCU-CHEK GUIDE) w/Device KIT  1 Device by Does not apply route daily. 12/14/19  Yes McLean-Scocuzza, Nino Glow, MD  busPIRone (BUSPAR) 7.5 MG tablet Take 1 tablet (7.5 mg total) by mouth 2 (two) times daily. 07/05/22  Yes McLean-Scocuzza, Nino Glow, MD  citalopram (CELEXA) 10 MG tablet Take 1 tablet (10 mg total) by mouth daily. 07/05/22  Yes McLean-Scocuzza, Nino Glow, MD  glipiZIDE (GLUCOTROL XL) 10 MG 24 hr tablet Take 1 tablet (10 mg total) by mouth daily with breakfast. 07/05/22  Yes McLean-Scocuzza, Nino Glow, MD  glucose blood (ACCU-CHEK GUIDE) test strip Used to check blood sugar once per day E11.9. 09/03/21  Yes McLean-Scocuzza, Nino Glow, MD  losartan-hydrochlorothiazide (HYZAAR) 100-25 MG tablet Take 1 tablet by mouth daily. In am 07/05/22  Yes McLean-Scocuzza, Nino Glow, MD  Magnesium 250 MG TABS Take by mouth daily.   Yes [provider]  meloxicam (MOBIC) 15 MG tablet Take 1 tablet (15 mg total) by mouth daily as needed for pain. 09/21/21  Yes McLean-Scocuzza, Nino Glow, MD  metFORMIN (GLUCOPHAGE-XR) 500 MG 24 hr tablet Take 2 tablets (1,000 mg total) by mouth 2 (two) times daily with a meal. 07/05/22  Yes McLean-Scocuzza, Nino Glow, MD  Multiple Vitamin (MULTI-VITAMIN DAILY PO) Take by mouth.   Yes [provider]  Omega-3 Fatty Acids (FISH OIL PO) Take by mouth.   Yes [provider]  rosuvastatin (CRESTOR) 10 MG tablet Take 1 tablet (10 mg total) by mouth at bedtime. 07/05/22  Yes McLean-Scocuzza, Nino Glow, MD  Semaglutide (RYBELSUS) 7 MG TABS Take 1 tablet by mouth daily. In 1 month 07/05/22  Yes McLean-Scocuzza, Nino Glow, MD  Vitamin D, Cholecalciferol, 1000 units CAPS Take 2,000 Int'l Units/day by mouth.    Yes [provider]  zolpidem (AMBIEN) 5 MG tablet Take 1 tablet (5 mg total) by mouth at bedtime as needed for sleep. 07/05/22  Yes McLean-Scocuzza, Nino Glow, MD  meclizine (ANTIVERT) 12.5 MG tablet Take 1 tablet (12.5 mg total) by mouth 2 (two) times daily as needed for dizziness. Patient not  taking: Reported on 10/11/2022 04/03/20   McLean-Scocuzza, Nino Glow, MD    Allergies as of 07/20/2022 - Review Complete 07/05/2022  Allergen Reaction Noted   Lisinopril  04/03/2020    Family History  Problem Relation Age of Onset   Colon cancer Mother 44   Heart disease Father    Stroke Father    Arthritis Brother    Diabetes Brother    Breast cancer Maternal Grandmother    Heart disease Maternal Grandfather    Arthritis Brother    Alcohol abuse Son    Hypertension Son    Esophageal cancer Neg Hx    Stomach cancer Neg Hx    Liver disease Neg Hx     Social History   Socioeconomic History   Marital status: Widowed    Spouse name: Not on file   Number of children: Not on file   Years of education: Not on file   Highest education level: Not on file  Occupational History   Not on file  Tobacco Use   Smoking status: Never   Smokeless tobacco: Never  Substance and Sexual Activity   Alcohol use: Yes    Comment: 2 drinks per month   Drug use: Not Currently   Sexual activity: Yes    Partners: Male  Other Topics Concern   Not on file  Social History Narrative   Twin sons 1 daughter    Married husband Marcello Moores x 20 years he died 03-20-22       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, back clerk retired    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 also used to live in [place], from [place]   Never smoker    3 pregnancys 4 live births   No guns, wears seat belt safe in relationship       Social Determinants of Health   Financial Resource Strain: [place]  (02/22/2022)   Overall Financial Resource Strain (CARDIA)    Difficulty of Paying Living Expenses: Not hard at all  Food Insecurity: No [place] (02/22/2022)   Hunger Vital Sign    Worried About Running Out of Food in the Last Year: Never true    [place] in the Last Year: Never true  Transportation Needs: No Transportation Needs (02/22/2022)   PRAPARE - Hydrologist (Medical): No    Lack of Transportation  (Non-Medical): No  Physical Activity: Sufficiently Active (02/22/2022)   Exercise Vital Sign    Days of Exercise per Week: 5 days    Minutes of Exercise per Session: 30 min  Stress: No Stress Concern Present (02/22/2022)   [place]    Feeling of Stress : Not at all  Social Connections: Unknown (02/22/2022)   Social Connection and Isolation Panel [NHANES]  Frequency of Communication with Friends and Family: More than three times a week    Frequency of Social Gatherings with Friends and Family: More than three times a week    Attends Religious Services: Not on file    Active Member of Clubs or Organizations: Not on file    Attends Archivist Meetings: Not on file    Marital Status: Married  Intimate Partner Violence: Not At Risk (02/22/2022)   Humiliation, Afraid, Rape, and Kick questionnaire    Fear of Current or Ex-Partner: No    Emotionally Abused: No    Physically Abused: No    Sexually Abused: No    Review of Systems: See HPI, otherwise negative ROS  Physical Exam: Pulse 76   Temp 97.6 F (36.4 C) (Temporal)   Ht 8' (2.438 m)   Wt 65.8 kg   SpO2 97%   BMI 11.06 kg/m  General:   Alert,  pleasant and cooperative in NAD Head:  Normocephalic and atraumatic. Lungs:  Clear to auscultation.    Heart:  Regular rate and rhythm.   Impression/Plan: Carlin Mamone is here for ophthalmic surgery.  Risks, benefits, limitations, and alternatives regarding ophthalmic surgery have been reviewed with the patient.  Questions have been answered.  All parties agreeable.   Leandrew Koyanagi, MD  10/20/2022, 10:11 AM

## 2022-10-20 NOTE — Op Note (Signed)
OPERATIVE NOTE  Christy Stout [id number] 10/20/2022   PREOPERATIVE DIAGNOSIS:  Nuclear sclerotic cataract left eye. H25.12   POSTOPERATIVE DIAGNOSIS:    Nuclear sclerotic cataract left eye.     PROCEDURE:  Phacoemusification with posterior chamber intraocular lens placement of the left eye  Ultrasound time: Procedure(s) with comments: CATARACT EXTRACTION PHACO AND INTRAOCULAR LENS PLACEMENT (IOC) LEFT DIABETIC CLAREON VIVITY LENS (Left) - 12.12 1:10.6  LENS:   Implant Name Type Inv. Item Serial No. Manufacturer Lot No. LRB No. Used Action  LENS CLAREON VIVITY IOL 23.0 - [id number]  LENS CLAREON VIVITY IOL 23.0 [id number] [employer]  Left 1 Implanted      SURGEON:  Wyonia Hough, MD   ANESTHESIA:  Topical with tetracaine drops and 2% Xylocaine jelly, augmented with 1% preservative-free intracameral lidocaine.    COMPLICATIONS:  None.   DESCRIPTION OF PROCEDURE:  The patient was identified in the holding room and transported to the operating room and placed in the supine position under the operating microscope.  The left eye was identified as the operative eye and it was prepped and draped in the usual sterile ophthalmic fashion.   A 1 millimeter clear-corneal paracentesis was made at the 1:30 position.  0.5 ml of preservative-free 1% lidocaine was injected into the anterior chamber.  The anterior chamber was filled with Viscoat viscoelastic.  A 2.4 millimeter keratome was used to make a near-clear corneal incision at the 10:30 position.  .  A curvilinear capsulorrhexis was made with a cystotome and capsulorrhexis forceps.  Balanced salt solution was used to hydrodissect and hydrodelineate the nucleus.   Phacoemulsification was then used in stop and chop fashion to remove the lens nucleus and epinucleus.  The remaining cortex was then removed using the irrigation and aspiration handpiece. Provisc was then placed into the capsular bag to distend it for lens placement.  A  lens was then injected into the capsular bag.  The remaining viscoelastic was aspirated.   Wounds were hydrated with balanced salt solution.  The anterior chamber was inflated to a physiologic pressure with balanced salt solution.  No wound leaks were noted.  Cefuroxime 0.1 ml of a '10mg'$ /ml solution was injected into the anterior chamber for a dose of 1 mg of intracameral antibiotic at the completion of the case.    Timolol and Brimonidine drops were applied to the eye.  The patient was taken to the recovery room in stable condition without complications of anesthesia or surgery.  [name] 10/20/2022, 11:22 AM

## 2022-10-20 NOTE — Transfer of Care (Signed)
Immediate Anesthesia Transfer of Care Note  Patient: Christy Stout  Procedure(s) Performed: CATARACT EXTRACTION PHACO AND INTRAOCULAR LENS PLACEMENT (IOC) LEFT DIABETIC CLAREON VIVITY LENS (Left: Eye)  Patient Location: PACU  Anesthesia Type: MAC  Level of Consciousness: awake, alert  and patient cooperative  Airway and Oxygen Therapy: Patient Spontanous Breathing and Patient connected to supplemental oxygen  Post-op Assessment: Post-op Vital signs reviewed, Patient's Cardiovascular Status Stable, Respiratory Function Stable, Patent Airway and No signs of Nausea or vomiting  Post-op Vital Signs: Reviewed and stable  Complications: No notable events documented.

## 2022-10-20 NOTE — Anesthesia Preprocedure Evaluation (Signed)
Anesthesia Evaluation  Patient identified by MRN, date of birth, ID band Patient awake    Reviewed: Allergy & Precautions, NPO status , Patient's Chart, lab work & pertinent test results  History of Anesthesia Complications Negative for: history of anesthetic complications  Airway Mallampati: III  TM Distance: <3 FB Neck ROM: full    Dental  (+) Chipped   Pulmonary neg pulmonary ROS, neg shortness of breath   Pulmonary exam normal        Cardiovascular Exercise Tolerance: Good hypertension, + angina  + CAD  Normal cardiovascular exam     Neuro/Psych negative neurological ROS  negative psych ROS   GI/Hepatic Neg liver ROS,GERD  Controlled,,  Endo/Other  diabetes, Type 2    Renal/GU      Musculoskeletal   Abdominal   Peds  Hematology negative hematology ROS (+)   Anesthesia Other Findings Past Medical History: No date: Arthritis No date: Bacterial vaginosis 2000: Breast cancer, right ([place])     Comment:   lumpectomy 8 rounds chemo, 6 weeks radiation mammo               02/24/17 negative on Arimedex x 7 years  No date: Cataract No date: COVID-19     Comment:  04/13/21 No date: Diabetes mellitus without complication (HCC) No date: Diverticulosis No date: GERD (gastroesophageal reflux disease) No date: History of chicken pox No date: Hyperlipidemia No date: Hypertension 2000: Personal history of chemotherapy     Comment:  Rt. Breast 2000: Personal history of radiation therapy     Comment:  Rt. breast No date: UTI (urinary tract infection) No date: UTI (urinary tract infection)     Comment:  e coli 12/2020   Past Surgical History: 2000: BREAST EXCISIONAL BIOPSY; Right     Comment:  + No date: BREAST SURGERY     Comment:  2000 No date: WRIST SURGERY; Left  BMI    Body Mass Index: 11.06 kg/m      Reproductive/Obstetrics negative OB ROS                             Anesthesia  Physical Anesthesia Plan  ASA: 3  Anesthesia Plan: MAC   Post-op Pain Management:    Induction: Intravenous  PONV Risk Score and Plan:   Airway Management Planned: Natural Airway and Nasal Cannula  Additional Equipment:   Intra-op Plan:   Post-operative Plan:   Informed Consent: I have reviewed the patients History and Physical, chart, labs and discussed the procedure including the risks, benefits and alternatives for the proposed anesthesia with the patient or authorized representative who has indicated his/her understanding and acceptance.     Dental Advisory Given  Plan Discussed with: Anesthesiologist, CRNA and Surgeon  Anesthesia Plan Comments: (Patient consented for risks of anesthesia including but not limited to:  - adverse reactions to medications - damage to eyes, teeth, lips or other oral mucosa - nerve damage due to positioning  - sore throat or hoarseness - Damage to heart, brain, nerves, lungs, other parts of body or loss of life  Patient voiced understanding.)       Anesthesia Quick Evaluation

## 2022-10-20 NOTE — Anesthesia Postprocedure Evaluation (Signed)
Anesthesia Post Note  Patient: [name]  Procedure(s) Performed: CATARACT EXTRACTION PHACO AND INTRAOCULAR LENS PLACEMENT (IOC) LEFT DIABETIC CLAREON VIVITY LENS (Left: Eye)  Patient location during evaluation: PACU Anesthesia Type: MAC Level of consciousness: awake and alert Pain management: pain level controlled Vital Signs Assessment: post-procedure vital signs reviewed and stable Respiratory status: spontaneous breathing, nonlabored ventilation and respiratory function stable Cardiovascular status: blood pressure returned to baseline and stable Postop Assessment: no apparent nausea or vomiting Anesthetic complications: no   No notable events documented.   Last Vitals:  Vitals:   10/20/22 1123 10/20/22 1129  BP: (!) 100/58 100/64  Pulse: 77 70  Resp: 11 14  Temp: (!) 36.3 C (!) 36.3 C  SpO2: 95% 96%    Last Pain:  Vitals:   10/20/22 1129  TempSrc:   PainSc: 0-No pain                 Precious Haws Lorell Thibodaux

## 2022-10-21 ENCOUNTER — Encounter: Payer: Self-pay | Admitting: Ophthalmology

## 2022-10-26 DIAGNOSIS — H2511 Age-related nuclear cataract, right eye: Secondary | ICD-10-CM | POA: Diagnosis not present

## 2022-10-27 ENCOUNTER — Encounter: Payer: Self-pay | Admitting: Ophthalmology

## 2022-11-01 NOTE — Discharge Instructions (Signed)

## 2022-11-03 ENCOUNTER — Other Ambulatory Visit: Payer: Self-pay

## 2022-11-03 ENCOUNTER — Encounter: Payer: Self-pay | Admitting: Ophthalmology

## 2022-11-03 ENCOUNTER — Ambulatory Visit
Admission: RE | Admit: 2022-11-03 | Discharge: 2022-11-03 | Disposition: A | Discharge status: Home / Self Care | Payer: Medicare Other | Source: Ambulatory Visit | Attending: Ophthalmology | Admitting: Ophthalmology

## 2022-11-03 ENCOUNTER — Ambulatory Visit: Payer: Medicare Other | Admitting: Anesthesiology

## 2022-11-03 ENCOUNTER — Encounter
Admission: RE | Disposition: A | Discharge status: Home / Self Care | Payer: Self-pay | Source: Ambulatory Visit | Attending: Ophthalmology

## 2022-11-03 DIAGNOSIS — I10 Essential (primary) hypertension: Secondary | ICD-10-CM | POA: Insufficient documentation

## 2022-11-03 DIAGNOSIS — Z7984 Long term (current) use of oral hypoglycemic drugs: Secondary | ICD-10-CM | POA: Insufficient documentation

## 2022-11-03 DIAGNOSIS — Z853 Personal history of malignant neoplasm of breast: Secondary | ICD-10-CM | POA: Insufficient documentation

## 2022-11-03 DIAGNOSIS — I25119 Atherosclerotic heart disease of native coronary artery with unspecified angina pectoris: Secondary | ICD-10-CM | POA: Diagnosis not present

## 2022-11-03 DIAGNOSIS — Z9221 Personal history of antineoplastic chemotherapy: Secondary | ICD-10-CM | POA: Diagnosis not present

## 2022-11-03 DIAGNOSIS — K219 Gastro-esophageal reflux disease without esophagitis: Secondary | ICD-10-CM | POA: Diagnosis not present

## 2022-11-03 DIAGNOSIS — Z923 Personal history of irradiation: Secondary | ICD-10-CM | POA: Diagnosis not present

## 2022-11-03 DIAGNOSIS — E1136 Type 2 diabetes mellitus with diabetic cataract: Secondary | ICD-10-CM | POA: Diagnosis not present

## 2022-11-03 DIAGNOSIS — T7840XA Allergy, unspecified, initial encounter: Secondary | ICD-10-CM | POA: Diagnosis not present

## 2022-11-03 DIAGNOSIS — H2511 Age-related nuclear cataract, right eye: Secondary | ICD-10-CM | POA: Diagnosis not present

## 2022-11-03 DIAGNOSIS — E785 Hyperlipidemia, unspecified: Secondary | ICD-10-CM | POA: Insufficient documentation

## 2022-11-03 DIAGNOSIS — E119 Type 2 diabetes mellitus without complications: Secondary | ICD-10-CM

## 2022-11-03 DIAGNOSIS — M1990 Unspecified osteoarthritis, unspecified site: Secondary | ICD-10-CM | POA: Insufficient documentation

## 2022-11-03 HISTORY — PX: CATARACT EXTRACTION W/PHACO: SHX586

## 2022-11-03 LAB — GLUCOSE, CAPILLARY: Glucose-Capillary: 118 mg/dL — ABNORMAL HIGH (ref 70–99)

## 2022-11-03 SURGERY — PHACOEMULSIFICATION, CATARACT, WITH IOL INSERTION
Anesthesia: Monitor Anesthesia Care | Site: Eye | Laterality: Right

## 2022-11-03 MED ORDER — SIGHTPATH DOSE#1 NA HYALUR & NA CHOND-NA HYALUR IO KIT
PACK | INTRAOCULAR | Status: DC | PRN
  Administered 2022-11-03: 1 via OPHTHALMIC

## 2022-11-03 MED ORDER — SIGHTPATH DOSE#1 BSS IO SOLN
INTRAOCULAR | Status: DC | PRN
  Administered 2022-11-03: 15 mL via INTRAOCULAR

## 2022-11-03 MED ORDER — PHENYLEPHRINE-KETOROLAC 1-0.3 % IO SOLN
INTRAOCULAR | Status: DC | PRN
  Administered 2022-11-03: 95 mL via OPHTHALMIC

## 2022-11-03 MED ORDER — FENTANYL CITRATE (PF) 100 MCG/2ML IJ SOLN
INTRAMUSCULAR | Status: DC | PRN
  Administered 2022-11-03: 50 ug via INTRAVENOUS

## 2022-11-03 MED ORDER — CEFUROXIME OPHTHALMIC INJECTION 1 MG/0.1 ML
INJECTION | OPHTHALMIC | Status: DC | PRN
  Administered 2022-11-03: .1 mL via INTRACAMERAL

## 2022-11-03 MED ORDER — ARMC OPHTHALMIC DILATING DROPS
1.0000 | OPHTHALMIC | Status: DC | PRN
  Administered 2022-11-03 (×3): 1 via OPHTHALMIC

## 2022-11-03 MED ORDER — MIDAZOLAM HCL 2 MG/2ML IJ SOLN
INTRAMUSCULAR | Status: DC | PRN
  Administered 2022-11-03: 1 mg via INTRAVENOUS

## 2022-11-03 MED ORDER — SIGHTPATH DOSE#1 BSS IO SOLN
INTRAOCULAR | Status: DC | PRN
  Administered 2022-11-03: 2 mL

## 2022-11-03 MED ORDER — BRIMONIDINE TARTRATE-TIMOLOL 0.2-0.5 % OP SOLN
OPHTHALMIC | Status: DC | PRN
  Administered 2022-11-03: 1 [drp] via OPHTHALMIC

## 2022-11-03 MED ORDER — TETRACAINE HCL 0.5 % OP SOLN
1.0000 [drp] | OPHTHALMIC | Status: DC | PRN
  Administered 2022-11-03 (×3): 1 [drp] via OPHTHALMIC

## 2022-11-03 SURGICAL SUPPLY — 14 items
CANNULA ANT/CHMB 27G (MISCELLANEOUS) IMPLANT
CANNULA ANT/CHMB 27GA (MISCELLANEOUS) IMPLANT
CATARACT SUITE SIGHTPATH (MISCELLANEOUS) ×1 IMPLANT
FEE CATARACT SUITE SIGHTPATH (MISCELLANEOUS) ×1 IMPLANT
GLOVE SRG 8 PF TXTR STRL LF DI (GLOVE) ×1 IMPLANT
GLOVE SURG ENC TEXT LTX SZ7.5 (GLOVE) ×1 IMPLANT
GLOVE SURG UNDER POLY LF SZ8 (GLOVE) ×1
LENS CLAREON VIVITY TORIC 23.5 ×1 IMPLANT
LENS CLRN VIVITY TORIC 3 23.5 ×1 IMPLANT
LENS IOL CLRN VT TRC 3 23.5 IMPLANT
NDL FILTER BLUNT 18X1 1/2 (NEEDLE) ×1 IMPLANT
NEEDLE FILTER BLUNT 18X1 1/2 (NEEDLE) ×1 IMPLANT
SYR 3ML LL SCALE MARK (SYRINGE) ×1 IMPLANT
WATER STERILE IRR 250ML POUR (IV SOLUTION) ×1 IMPLANT

## 2022-11-03 NOTE — Anesthesia Preprocedure Evaluation (Signed)
Anesthesia Evaluation  Patient identified by MRN, date of birth, ID band Patient awake    Reviewed: Allergy & Precautions, NPO status , Patient's Chart, lab work & pertinent test results  History of Anesthesia Complications Negative for: history of anesthetic complications  Airway Mallampati: III  TM Distance: <3 FB Neck ROM: full    Dental no notable dental hx. (+) Teeth Intact   Pulmonary neg pulmonary ROS, neg shortness of breath, neg sleep apnea, neg COPD, Patient abstained from smoking.Not current smoker   Pulmonary exam normal breath sounds clear to auscultation       Cardiovascular Exercise Tolerance: Good METShypertension, + angina  + CAD  (-) Past MI Normal cardiovascular exam(-) dysrhythmias  Rhythm:Regular Rate:Normal - Systolic murmurs    Neuro/Psych negative neurological ROS  negative psych ROS   GI/Hepatic Neg liver ROS,GERD  Controlled,,  Endo/Other  diabetes, Type 2    Renal/GU negative Renal ROS     Musculoskeletal   Abdominal   Peds  Hematology negative hematology ROS (+)   Anesthesia Other Findings Past Medical History: No date: Arthritis No date: Bacterial vaginosis 2000: Breast cancer, right ([facility])     Comment:   lumpectomy 8 rounds chemo, 6 weeks radiation mammo               02/24/17 negative on Arimedex x 7 years  No date: Cataract No date: COVID-19     Comment:  04/13/21 No date: Diabetes mellitus without complication (HCC) No date: Diverticulosis No date: GERD (gastroesophageal reflux disease) No date: History of chicken pox No date: Hyperlipidemia No date: Hypertension 2000: Personal history of chemotherapy     Comment:  Rt. Breast 2000: Personal history of radiation therapy     Comment:  Rt. breast No date: UTI (urinary tract infection) No date: UTI (urinary tract infection)     Comment:  e coli 12/2020   Past Surgical History: 2000: BREAST EXCISIONAL BIOPSY; Right      Comment:  + No date: BREAST SURGERY     Comment:  2000 No date: WRIST SURGERY; Left  BMI    Body Mass Index: 11.06 kg/m      Reproductive/Obstetrics negative OB ROS                              Anesthesia Physical Anesthesia Plan  ASA: 2  Anesthesia Plan: MAC   Post-op Pain Management: Minimal or no pain anticipated   Induction: Intravenous  PONV Risk Score and Plan: 1 and Midazolam  Airway Management Planned: Nasal Cannula  Additional Equipment:   Intra-op Plan:   Post-operative Plan:   Informed Consent: I have reviewed the patients History and Physical, chart, labs and discussed the procedure including the risks, benefits and alternatives for the proposed anesthesia with the patient or authorized representative who has indicated his/her understanding and acceptance.       Plan Discussed with: CRNA and Surgeon  Anesthesia Plan Comments: (Explained risks of anesthesia, including PONV, and rare emergencies such as cardiac events, respiratory problems, and allergic reactions, requiring invasive intervention. Discussed the role of CRNA in patient's perioperative care. Patient understands. )        Anesthesia Quick Evaluation

## 2022-11-03 NOTE — H&P (Signed)
[facility]   Primary Care Physician:  Carollee Leitz, MD Ophthalmologist: Dr. Leandrew Koyanagi  Pre-Procedure History & Physical: HPI:  Christy Stout is a 72 y.o. female here for ophthalmic surgery.   Past Medical History:  Diagnosis Date   Arthritis    Bacterial vaginosis    Breast cancer, right ([place]) 2000    lumpectomy 8 rounds chemo, 6 weeks radiation mammo 02/24/17 negative on Arimedex x 7 years    Cataract    COVID-19    04/13/21   Diabetes mellitus without complication (HCC)    Diverticulosis    GERD (gastroesophageal reflux disease)    History of chicken pox    Hyperlipidemia    Hypertension    Personal history of chemotherapy 2000   Rt. Breast   Personal history of radiation therapy 2000   Rt. breast   UTI (urinary tract infection)    UTI (urinary tract infection)    e coli 12/2020     Past Surgical History:  Procedure Laterality Date   BREAST EXCISIONAL BIOPSY Right 2000   +   BREAST SURGERY     2000   CATARACT EXTRACTION W/PHACO Left 10/20/2022   Procedure: CATARACT EXTRACTION PHACO AND INTRAOCULAR LENS PLACEMENT ([place]) LEFT DIABETIC CLAREON VIVITY LENS;  Surgeon: Leandrew Koyanagi, MD;  Location: [place];  Service: Ophthalmology;  Laterality: Left;  12.12 1:10.6   WRIST SURGERY Left     Prior to Admission medications   Medication Sig Start Date End Date Taking? Authorizing Provider  amLODipine (NORVASC) 2.5 MG tablet Take 1 tablet (2.5 mg total) by mouth daily. 07/05/22  Yes McLean-Scocuzza, Nino Glow, MD  aspirin EC 81 MG tablet Take 81 mg by mouth daily.   Yes [provider]  busPIRone (BUSPAR) 7.5 MG tablet Take 1 tablet (7.5 mg total) by mouth 2 (two) times daily. 07/05/22  Yes McLean-Scocuzza, Nino Glow, MD  citalopram (CELEXA) 10 MG tablet Take 1 tablet (10 mg total) by mouth daily. 07/05/22  Yes McLean-Scocuzza, Nino Glow, MD  glipiZIDE (GLUCOTROL XL) 10 MG 24 hr tablet Take 1 tablet (10 mg total) by mouth daily with  breakfast. 07/05/22  Yes McLean-Scocuzza, Nino Glow, MD  loratadine (CLARITIN) 10 MG tablet Take 10 mg by mouth daily.   Yes [provider]  losartan-hydrochlorothiazide (HYZAAR) 100-25 MG tablet Take 1 tablet by mouth daily. In am 07/05/22  Yes McLean-Scocuzza, Nino Glow, MD  Magnesium 250 MG TABS Take by mouth daily.   Yes [provider]  meloxicam (MOBIC) 15 MG tablet Take 1 tablet (15 mg total) by mouth daily as needed for pain. 09/21/21  Yes McLean-Scocuzza, Nino Glow, MD  metFORMIN (GLUCOPHAGE-XR) 500 MG 24 hr tablet Take 2 tablets (1,000 mg total) by mouth 2 (two) times daily with a meal. 07/05/22  Yes McLean-Scocuzza, Nino Glow, MD  Multiple Vitamin (MULTI-VITAMIN DAILY PO) Take by mouth.   Yes [provider]  Omega-3 Fatty Acids (FISH OIL PO) Take by mouth.   Yes [provider]  rosuvastatin (CRESTOR) 10 MG tablet Take 1 tablet (10 mg total) by mouth at bedtime. 07/05/22  Yes McLean-Scocuzza, Nino Glow, MD  Semaglutide (RYBELSUS) 7 MG TABS Take 1 tablet by mouth daily. In 1 month 07/05/22  Yes McLean-Scocuzza, Nino Glow, MD  Vitamin D, Cholecalciferol, 1000 units CAPS Take 2,000 Int'l Units/day by mouth.    Yes [provider]  zolpidem (AMBIEN) 5 MG tablet Take 1 tablet (5 mg total) by mouth at bedtime as needed for  sleep. 07/05/22  Yes McLean-Scocuzza, Nino Glow, MD  Accu-Chek Softclix Lancets lancets Used to check blood sugars once per day. Softclix lancets or ask pt which she prefers 11/30/19   McLean-Scocuzza, Nino Glow, MD  blood glucose meter kit and supplies KIT Dispense based on patient and insurance preference. Use up to four times daily as directed. (FOR ICD-9 250.00, 250.01). 12/05/19   McLean-Scocuzza, Nino Glow, MD  blood glucose meter kit and supplies Dispense based on patient and insurance preference. Bid  directed. (FOR ICD-10 E10.9, E11.9). 1 year supply strips and lancets 11/19/19   McLean-Scocuzza, Nino Glow, MD  Blood Glucose Monitoring Suppl (ACCU-CHEK  GUIDE) w/Device KIT 1 Device by Does not apply route daily. 12/14/19   McLean-Scocuzza, Nino Glow, MD  glucose blood (ACCU-CHEK GUIDE) test strip Used to check blood sugar once per day E11.9. 09/03/21   McLean-Scocuzza, Nino Glow, MD  meclizine (ANTIVERT) 12.5 MG tablet Take 1 tablet (12.5 mg total) by mouth 2 (two) times daily as needed for dizziness. Patient not taking: Reported on 10/11/2022 04/03/20   McLean-Scocuzza, Nino Glow, MD    Allergies as of 07/20/2022 - Review Complete 07/05/2022  Allergen Reaction Noted   Lisinopril  04/03/2020    Family History  Problem Relation Age of Onset   Colon cancer Mother 35   Heart disease Father    Stroke Father    Arthritis Brother    Diabetes Brother    Breast cancer Maternal Grandmother    Heart disease Maternal Grandfather    Arthritis Brother    Alcohol abuse Son    Hypertension Son    Esophageal cancer Neg Hx    Stomach cancer Neg Hx    Liver disease Neg Hx     Social History   Socioeconomic History   Marital status: Widowed    Spouse name: Not on file   Number of children: Not on file   Years of education: Not on file   Highest education level: Not on file  Occupational History   Not on file  Tobacco Use   Smoking status: Never   Smokeless tobacco: Never  Substance and Sexual Activity   Alcohol use: Yes    Comment: 2 drinks per month   Drug use: Not Currently   Sexual activity: Yes    Partners: Male  Other Topics Concern   Not on file  Social History Narrative   Twin sons 1 daughter    Married husband Marcello Moores x 20 years he died 04/17/2022       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, back clerk retired    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 also used to live in [place], from [place]   Never smoker    3 pregnancys 4 live births   No guns, wears seat belt safe in relationship       Social Determinants of Health   Financial Resource Strain: [place]  (02/22/2022)   Overall Financial Resource Strain (CARDIA)    Difficulty of Paying Living Expenses:  Not hard at all  Food Insecurity: No [place] (02/22/2022)   Hunger Vital Sign    Worried About Running Out of Food in the Last Year: Never true    [place] in the Last Year: Never true  Transportation Needs: No Transportation Needs (02/22/2022)   PRAPARE - Hydrologist (Medical): No    Lack of Transportation (Non-Medical): No  Physical Activity: Sufficiently Active (02/22/2022)   Exercise Vital Sign  Days of Exercise per Week: 5 days    Minutes of Exercise per Session: 30 min  Stress: No Stress Concern Present (02/22/2022)   [place]    Feeling of Stress : Not at all  Social Connections: Unknown (02/22/2022)   Social Connection and Isolation Panel [NHANES]    Frequency of Communication with Friends and Family: More than three times a week    Frequency of Social Gatherings with Friends and Family: More than three times a week    Attends Religious Services: Not on file    Active Member of [place] or Organizations: Not on file    Attends Archivist Meetings: Not on file    Marital Status: Married  Intimate Partner Violence: Not At Risk (02/22/2022)   Humiliation, Afraid, Rape, and Kick questionnaire    Fear of Current or Ex-Partner: No    Emotionally Abused: No    Physically Abused: No    Sexually Abused: No    Review of Systems: See HPI, otherwise negative ROS  Physical Exam: BP (!) 142/70   Pulse 70   Temp (!) 97 F (36.1 C) (Temporal)   Ht '5\' 8"'$  (1.727 m)   Wt 66.2 kg   SpO2 98%   BMI 22.20 kg/m  General:   Alert,  pleasant and cooperative in NAD Head:  Normocephalic and atraumatic. Lungs:  Clear to auscultation.    Heart:  Regular rate and rhythm.   Impression/Plan: Christy Stout is here for ophthalmic surgery.  Risks, benefits, limitations, and alternatives regarding ophthalmic surgery have been reviewed with the patient.  Questions have been  answered.  All parties agreeable.   Leandrew Koyanagi, MD  11/03/2022, 7:27 AM

## 2022-11-03 NOTE — Op Note (Signed)
  LOCATION:  [facility]   PREOPERATIVE DIAGNOSIS:  Nuclear sclerotic cataract of the right eye.  H25.11   POSTOPERATIVE DIAGNOSIS:  Nuclear sclerotic cataract of the right eye.   PROCEDURE:  Phacoemulsification with Toric posterior chamber intraocular lens placement of the right eye.  Ultrasound time: Procedure(s) with comments: CATARACT EXTRACTION PHACO AND INTRAOCULAR LENS PLACEMENT ([facility]) RIGHT DIABETIC CLAREON TORIC 9.45 00:56.9 (Right) - Diabetic  LENS:   Implant Name Type Inv. Item Serial No. Manufacturer Lot No. LRB No. Used Action  LENS CLAREON VIVITY TORIC 23.5 - [id number]  LENS CLAREON VIVITY TORIC 23.5 [id number] SIGHTPATH  Right 1 Implanted     [id number] 23.5 Toric intraocular lens with 1.5 diopters of cylindrical power with axis orientation at 138 degrees.   SURGEON:  Wyonia Hough, MD   ANESTHESIA: Topical with tetracaine drops and 2% Xylocaine jelly, augmented with 1% preservative-free intracameral lidocaine. .   COMPLICATIONS:  None.   DESCRIPTION OF PROCEDURE:  The patient was identified in the holding room and transported to the operating suite and placed in the supine position under the operating microscope.  The right eye was identified as the operative eye, and it was prepped and draped in the usual sterile ophthalmic fashion.    A clear-corneal paracentesis incision was made at the 12:00 position.  0.5 ml of preservative-free 1% lidocaine was injected into the anterior chamber. The anterior chamber was filled with Viscoat.  A 2.4 millimeter near clear corneal incision was then made at the 9:00 position.  A cystotome and capsulorrhexis forceps were then used to make a curvilinear capsulorrhexis.  Hydrodissection and hydrodelineation were then performed using balanced salt solution.   Phacoemulsification was then used in stop and chop fashion to remove the lens, nucleus and epinucleus.  The remaining cortex was aspirated using the irrigation and  aspiration handpiece.  Provisc viscoelastic was then placed into the capsular bag to distend it for lens placement.  The Verion digital marker was used to align the implant at the intended axis.   A Toric lens was then injected into the capsular bag.  It was rotated clockwise until the axis marks on the lens were approximately 15 degrees in the counterclockwise direction to the intended alignment.  The viscoelastic was aspirated from the eye using the irrigation aspiration handpiece.  Then, a Koch spatula through the sideport incision was used to rotate the lens in a clockwise direction until the axis markings of the intraocular lens were lined up with the Verion alignment.  Balanced salt solution was then used to hydrate the wounds. Cefuroxime 0.1 ml of a '10mg'$ /ml solution was injected into the anterior chamber for a dose of 1 mg of intracameral antibiotic at the completion of the case.    The eye was noted to have a physiologic pressure and there was no wound leak noted.   Timolol and Brimonidine drops were applied to the eye.  The patient was taken to the recovery room in stable condition having had no complications of anesthesia or surgery.  Rendell Thivierge 11/03/2022, 8:22 AM

## 2022-11-03 NOTE — Anesthesia Postprocedure Evaluation (Signed)
Anesthesia Post Note  Patient: Shadiamond Koska  Procedure(s) Performed: CATARACT EXTRACTION PHACO AND INTRAOCULAR LENS PLACEMENT ([facility]) RIGHT DIABETIC CLAREON TORIC 9.45 00:56.9 (Right: Eye)  Patient location during evaluation: PACU Anesthesia Type: MAC Level of consciousness: awake and alert Pain management: pain level controlled Vital Signs Assessment: post-procedure vital signs reviewed and stable Respiratory status: spontaneous breathing, nonlabored ventilation, respiratory function stable and patient connected to nasal cannula oxygen Cardiovascular status: stable and blood pressure returned to baseline Postop Assessment: no apparent nausea or vomiting Anesthetic complications: no   No notable events documented.   Last Vitals:  Vitals:   11/03/22 0657 11/03/22 0823  BP: (!) 142/70 103/65  Pulse: 70 68  Resp:  18  Temp: (!) 36.1 C (!) 36.2 C  SpO2: 98% 96%    Last Pain:  Vitals:   11/03/22 0823  TempSrc:   PainSc: 0-No pain                 Arita Miss

## 2022-11-03 NOTE — Transfer of Care (Signed)
Immediate Anesthesia Transfer of Care Note  Patient: Christy Stout  Procedure(s) Performed: CATARACT EXTRACTION PHACO AND INTRAOCULAR LENS PLACEMENT ([facility]) RIGHT DIABETIC CLAREON TORIC 9.45 00:56.9 (Right: Eye)  Patient Location: PACU  Anesthesia Type: MAC  Level of Consciousness: awake, alert  and patient cooperative  Airway and Oxygen Therapy: Patient Spontanous Breathing and Patient connected to supplemental oxygen  Post-op Assessment: Post-op Vital signs reviewed, Patient's Cardiovascular Status Stable, Respiratory Function Stable, Patent Airway and No signs of Nausea or vomiting  Post-op Vital Signs: Reviewed and stable  Complications: No notable events documented.

## 2023-01-04 ENCOUNTER — Ambulatory Visit (INDEPENDENT_AMBULATORY_CARE_PROVIDER_SITE_OTHER): Payer: Medicare Other | Admitting: Family Medicine

## 2023-01-04 ENCOUNTER — Encounter: Payer: Self-pay | Admitting: Family Medicine

## 2023-01-04 VITALS — BP 130/70 | HR 80 | Temp 98.5°F | Ht 67.5 in | Wt 144.4 lb

## 2023-01-04 DIAGNOSIS — I2510 Atherosclerotic heart disease of native coronary artery without angina pectoris: Secondary | ICD-10-CM | POA: Diagnosis not present

## 2023-01-04 DIAGNOSIS — E1159 Type 2 diabetes mellitus with other circulatory complications: Secondary | ICD-10-CM

## 2023-01-04 DIAGNOSIS — Z23 Encounter for immunization: Secondary | ICD-10-CM | POA: Diagnosis not present

## 2023-01-04 DIAGNOSIS — G4700 Insomnia, unspecified: Secondary | ICD-10-CM

## 2023-01-04 DIAGNOSIS — M1710 Unilateral primary osteoarthritis, unspecified knee: Secondary | ICD-10-CM | POA: Diagnosis not present

## 2023-01-04 DIAGNOSIS — F39 Unspecified mood [affective] disorder: Secondary | ICD-10-CM | POA: Diagnosis not present

## 2023-01-04 DIAGNOSIS — I152 Hypertension secondary to endocrine disorders: Secondary | ICD-10-CM | POA: Diagnosis not present

## 2023-01-04 DIAGNOSIS — E118 Type 2 diabetes mellitus with unspecified complications: Secondary | ICD-10-CM | POA: Diagnosis not present

## 2023-01-04 DIAGNOSIS — E785 Hyperlipidemia, unspecified: Secondary | ICD-10-CM

## 2023-01-04 NOTE — Patient Instructions (Addendum)
It was a pleasure meeting you today. Thank you for allowing me to take part in your health care.  Our goals for today as we discussed include:  Stop Glipizide Monitor blood sugar at home in the morning before breakfast.  If blood sugar increases to greater than 300 notify MD.  If greater than 400 go to Emergency.  You have received the Pneumonia 20 vaccine today.  No further vaccines needed.  Please have you eye doctor send a copy of you last diabetic eye exam.  Please schedule Medicare Annual Wellness Tues/Thurs afternoon  Follow up in 1 month with PCP for diabetic management Schedule lab appointment 1 week prior to office visit.  Please fast for 8-10 hrs  Please arrive 15 minutes prior to your appointment.  Arrivals 5 minutes past your appointment time will need to be rescheduled.  This is to ensure that all patients are seen in a timely manner.  Thank you for your understanding and cooperation.    If you have any questions or concerns, please do not hesitate to call the office at [phone].  I look forward to our next visit and until then take care and stay safe.  Regards,   Carollee Leitz, MD   [facility]

## 2023-01-04 NOTE — Progress Notes (Signed)
SUBJECTIVE:   Chief Complaint  Patient presents with   Transitions Of Care   HPI Presents to clinic to transfer care  No acute concerns today.  Hypertension Asymptomatic.  Reports home blood pressures 122-125/70.  Compliant with current medication.  Takes Norvasc 2.5 mg daily and losartan 100-200 mg daily.  Tolerating medications well.  Diabetes type 2 Asymptomatic.  Currently on metformin XR 1 g twice daily, glipizide 10 mg daily and Rybelsus 7 mg daily.  Has been on chronic dose of Rybelsus for greater than 1 year.  Tolerating well.  Last A1c 6.1. On statin and ARB.  Hyperlipidemia Taking Crestor 10 mg nightly and omega-3 fatty acids.  Tolerating medication well.  Mood disorder Was initially prescribed BuSpar 7.5 mg secondary to anxiety after loss of husband in 2023.  She was then prescribed Celexa in August 2023 for continued grief.  Remained on current medications but reports feeling fine and is wondering if maybe he can come off some medication.  Denies any depressed mood, SI/HI.  Endorses continued anxiety use of BuSpar daily secondary to anxiety.  Sleep disorder Long-term use of Ambien 5 mg nightly for sleep.  Has had sleep issues for years.  Does not recall using other medications for sleep.   PERTINENT PMH / PSH: Mood disorder Hyperlipidemia Hypertension Diabetes type 2   OBJECTIVE:  BP 130/70   Pulse 80   Temp 98.5 F (36.9 C) (Oral)   Ht 5' 7.5" (1.715 m)   Wt 144 lb 6.4 oz (65.5 kg)   SpO2 98%   BMI 22.28 kg/m    Physical Exam Constitutional:      General: She is not in acute distress.    Appearance: She is normal weight. She is not ill-appearing.  HENT:     Head: Normocephalic.  Eyes:     Conjunctiva/sclera: Conjunctivae normal.  Neck:     Thyroid: No thyromegaly or thyroid tenderness.  Cardiovascular:     Rate and Rhythm: Normal rate and regular rhythm.     Pulses: Normal pulses.  Pulmonary:     Effort: Pulmonary effort is normal.      Breath sounds: Normal breath sounds.  Abdominal:     General: Bowel sounds are normal.  Neurological:     Mental Status: She is alert. Mental status is at baseline.  Psychiatric:        Mood and Affect: Mood normal.        Behavior: Behavior normal.        Thought Content: Thought content normal.        Judgment: Judgment normal.     ASSESSMENT/PLAN:  Hypertension associated with diabetes Assessment & Plan: Chronic.  Stable.  Blood pressures at home 122/70.  Per JNC 8 guidelines at goal for age less than 140/90 Continue Norvasc 2.5 mg daily Continue Hyzaar 100-25 mg daily C-Met today Monitor blood pressure at home, goal less than 140/90   Orders: -     Hemoglobin A1c; Future -     Comprehensive metabolic panel; Future -     Vitamin B12; Future -     Microalbumin / creatinine urine ratio; Future  Hyperlipidemia, unspecified hyperlipidemia type Assessment & Plan: Chronic.  LDL at goal less than 50 On statin therapy and tolerating well.  Denies any myalgias. Continue Crestor 10 mg daily Repeat lipids at next visit  Orders: -     Lipid panel; Future  Arthritis of knee Assessment & Plan: Chronic.  Tricompartmental DJD noted on x-ray of  right knee 11/22.  Meloxicam 15 mg daily has been initiated. Discussed decreasing dose to 7.5 mg daily for age appropriateness Patient reports periodic use of meloxicam and agreeable to cutting dose in half.  Would recommend discontinuing meloxicam and if needed using Tylenol arthritis for pain. If worsening pain can refer to orthopedics.  Orders: -     Meloxicam; Take 1 tablet (7.5 mg total) by mouth daily as needed for pain.  Type 2 diabetes mellitus with complication, without long-term current use of insulin Assessment & Plan: Chronic.  Recent A1c 6.1. Discontinue glipizide to avoid risk of hypoglycemic events Continue metformin XR 500 mg 2 tabs twice daily Continue Rybelsus 7 mg daily Repeat A1c in 1 month, if elevated will  increase Rybelsus dose Check B12 level Blood pressure controlled, continue ARB Lipids at goal, continue statin Follows with ophthalmology, recent right cataract extraction and intraocular lens placement. Had similar procedure in January 2024 on left eye. Recommend yearly foot exams.  Due at next visit. Follow-up in 1 month    Coronary artery disease involving native coronary artery of native heart without angina pectoris Assessment & Plan: Chronic. On ASA and statin therapy   Encounter for administration of vaccine -     Pneumococcal conjugate vaccine 20-valent  Insomnia, unspecified type Assessment & Plan: Chronic.  Long-term use of Ambien. Discussed long-term risks and side effects of continued use to include confusion, falls, association with decreasing cognition. Patient prefers to continue use of medication. Will continue Ambien 5 mg nightly.  Recommend as needed use if possible.   Mood disorder Assessment & Plan: Chronic.  Currently taking BuSpar 7.5 mg 3 times daily and Celexa 10 mg daily.  Would like to try weaning medication. Had initially started medications for grief of loss of husband. Discussed weaning Celexa initially. Plan to continue further discussions in future.    PDMP reviewed  Return in about 1 month (around 02/03/2023).  Dana Allan, MD

## 2023-01-09 ENCOUNTER — Encounter: Payer: Self-pay | Admitting: Family Medicine

## 2023-01-09 DIAGNOSIS — M1710 Unilateral primary osteoarthritis, unspecified knee: Secondary | ICD-10-CM | POA: Insufficient documentation

## 2023-01-09 DIAGNOSIS — F39 Unspecified mood [affective] disorder: Secondary | ICD-10-CM | POA: Insufficient documentation

## 2023-01-09 DIAGNOSIS — Z23 Encounter for immunization: Secondary | ICD-10-CM | POA: Insufficient documentation

## 2023-01-09 MED ORDER — MELOXICAM 7.5 MG PO TABS
7.5000 mg | ORAL_TABLET | Freq: Every day | ORAL | Status: DC | PRN
Start: 2023-01-09 — End: 2023-08-11

## 2023-01-09 NOTE — Assessment & Plan Note (Signed)
Chronic.  LDL at goal less than 50 On statin therapy and tolerating well.  Denies any myalgias. Continue Crestor 10 mg daily Repeat lipids at next visit

## 2023-01-09 NOTE — Assessment & Plan Note (Addendum)
Chronic.  Tricompartmental DJD noted on x-ray of right knee 11/22.  Meloxicam 15 mg daily has been initiated. Discussed decreasing dose to 7.5 mg daily for age appropriateness Patient reports periodic use of meloxicam and agreeable to cutting dose in half.  Would recommend discontinuing meloxicam and if needed using Tylenol arthritis for pain. If worsening pain can refer to orthopedics.

## 2023-01-09 NOTE — Assessment & Plan Note (Signed)
Chronic.  Stable.  Blood pressures at home 122/70.  Per JNC 8 guidelines at goal for age less than 140/90 Continue Norvasc 2.5 mg daily Continue Hyzaar 100-25 mg daily C-Met today Monitor blood pressure at home, goal less than 140/90

## 2023-01-09 NOTE — Assessment & Plan Note (Addendum)
Chronic.  Recent A1c 6.1. Discontinue glipizide to avoid risk of hypoglycemic events Continue metformin XR 500 mg 2 tabs twice daily Continue Rybelsus 7 mg daily Repeat A1c in 1 month, if elevated will increase Rybelsus dose Check B12 level Blood pressure controlled, continue ARB Lipids at goal, continue statin Follows with ophthalmology, recent right cataract extraction and intraocular lens placement. Had similar procedure in January 2024 on left eye. Recommend yearly foot exams.  Due at next visit. Follow-up in 1 month

## 2023-01-09 NOTE — Assessment & Plan Note (Signed)
Chronic.  Long-term use of Ambien. Discussed long-term risks and side effects of continued use to include confusion, falls, association with decreasing cognition. Patient prefers to continue use of medication. Will continue Ambien 5 mg nightly.  Recommend as needed use if possible.

## 2023-01-09 NOTE — Assessment & Plan Note (Signed)
Chronic.  Currently taking BuSpar 7.5 mg 3 times daily and Celexa 10 mg daily.  Would like to try weaning medication. Had initially started medications for grief of loss of husband. Discussed weaning Celexa initially. Plan to continue further discussions in future.

## 2023-01-09 NOTE — Assessment & Plan Note (Signed)
Chronic. On ASA and statin therapy

## 2023-02-09 ENCOUNTER — Other Ambulatory Visit: Payer: Medicare Other

## 2023-02-10 ENCOUNTER — Other Ambulatory Visit (INDEPENDENT_AMBULATORY_CARE_PROVIDER_SITE_OTHER): Payer: Medicare Other

## 2023-02-10 ENCOUNTER — Telehealth: Payer: Self-pay | Admitting: Family Medicine

## 2023-02-10 DIAGNOSIS — E1159 Type 2 diabetes mellitus with other circulatory complications: Secondary | ICD-10-CM

## 2023-02-10 DIAGNOSIS — I152 Hypertension secondary to endocrine disorders: Secondary | ICD-10-CM | POA: Diagnosis not present

## 2023-02-10 DIAGNOSIS — E785 Hyperlipidemia, unspecified: Secondary | ICD-10-CM | POA: Diagnosis not present

## 2023-02-10 LAB — COMPREHENSIVE METABOLIC PANEL
ALT: 20 U/L (ref 0–35)
AST: 18 U/L (ref 0–37)
Albumin: 4.6 g/dL (ref 3.5–5.2)
Alkaline Phosphatase: 45 U/L (ref 39–117)
BUN: 15 mg/dL (ref 6–23)
CO2: 29 mEq/L (ref 19–32)
Calcium: 9.6 mg/dL (ref 8.4–10.5)
Chloride: 94 mEq/L — ABNORMAL LOW (ref 96–112)
Creatinine, Ser: 0.57 mg/dL (ref 0.40–1.20)
GFR: 91.02 mL/min (ref >60.00)
Glucose, Bld: 125 mg/dL — ABNORMAL HIGH (ref 70–99)
Potassium: 4 mEq/L (ref 3.5–5.1)
Sodium: 132 mEq/L — ABNORMAL LOW (ref 135–145)
Total Bilirubin: 0.4 mg/dL (ref 0.2–1.2)
Total Protein: 7.2 g/dL (ref 6.0–8.3)

## 2023-02-10 LAB — LIPID PANEL
Cholesterol: 111 mg/dL (ref 0–200)
HDL: 45.4 mg/dL (ref >39.00)
LDL Cholesterol: 34 mg/dL (ref 0–99)
NonHDL: 65.53
Total CHOL/HDL Ratio: 2
Triglycerides: 157 mg/dL — ABNORMAL HIGH (ref 0.0–149.0)
VLDL: 31.4 mg/dL (ref 0.0–40.0)

## 2023-02-10 LAB — MICROALBUMIN / CREATININE URINE RATIO
Creatinine,U: 150 mg/dL
Microalb Creat Ratio: 1 mg/g (ref 0.0–30.0)
Microalb, Ur: 1.5 mg/dL (ref 0.0–1.9)

## 2023-02-10 LAB — HEMOGLOBIN A1C: Hgb A1c MFr Bld: 6.4 % (ref 4.6–6.5)

## 2023-02-10 LAB — VITAMIN B12: Vitamin B-12: 214 pg/mL (ref 211–911)

## 2023-02-10 NOTE — Telephone Encounter (Signed)
Contacted Christy Stout to schedule their annual wellness visit. Appointment made for 02/24/2023.  Christy Stout; Care Guide Ambulatory Clinical Support [place] [facility] Direct Dial: [phone]

## 2023-02-14 ENCOUNTER — Encounter: Payer: Self-pay | Admitting: Family Medicine

## 2023-02-17 ENCOUNTER — Ambulatory Visit (INDEPENDENT_AMBULATORY_CARE_PROVIDER_SITE_OTHER): Payer: Medicare Other | Admitting: Family Medicine

## 2023-02-17 ENCOUNTER — Encounter: Payer: Self-pay | Admitting: Family Medicine

## 2023-02-17 VITALS — BP 128/72 | HR 86 | Wt 143.0 lb

## 2023-02-17 DIAGNOSIS — E1169 Type 2 diabetes mellitus with other specified complication: Secondary | ICD-10-CM | POA: Diagnosis not present

## 2023-02-17 DIAGNOSIS — E118 Type 2 diabetes mellitus with unspecified complications: Secondary | ICD-10-CM | POA: Diagnosis not present

## 2023-02-17 DIAGNOSIS — I152 Hypertension secondary to endocrine disorders: Secondary | ICD-10-CM | POA: Diagnosis not present

## 2023-02-17 DIAGNOSIS — E1165 Type 2 diabetes mellitus with hyperglycemia: Secondary | ICD-10-CM

## 2023-02-17 DIAGNOSIS — I10 Essential (primary) hypertension: Secondary | ICD-10-CM

## 2023-02-17 DIAGNOSIS — E1159 Type 2 diabetes mellitus with other circulatory complications: Secondary | ICD-10-CM

## 2023-02-17 DIAGNOSIS — F39 Unspecified mood [affective] disorder: Secondary | ICD-10-CM

## 2023-02-17 DIAGNOSIS — Z7984 Long term (current) use of oral hypoglycemic drugs: Secondary | ICD-10-CM | POA: Diagnosis not present

## 2023-02-17 DIAGNOSIS — G4700 Insomnia, unspecified: Secondary | ICD-10-CM

## 2023-02-17 DIAGNOSIS — E785 Hyperlipidemia, unspecified: Secondary | ICD-10-CM | POA: Diagnosis not present

## 2023-02-17 MED ORDER — ZOLPIDEM TARTRATE 5 MG PO TABS
5.0000 mg | ORAL_TABLET | Freq: Every evening | ORAL | 1 refills | Status: DC | PRN
Start: 2023-02-17 — End: 2023-09-09

## 2023-02-17 NOTE — Progress Notes (Signed)
SUBJECTIVE:   Chief Complaint  Patient presents with   Medical Management of Chronic Issues   HPI Presents to clinic to for chronic care management.  No acute concerns today.  Hypertension Doing well.  Denies any headaches, visual changes, chest pain, shortness of breath, heart palpitations or lower extremity edema.  Compliant with current medications.  Takes Norvasc 2.5 mg daily, Hyzaar 100-25 mg daily and tolerating well.  Diabetes type 2 Asymptomatic.  Currently on metformin XR 1 g twice daily, Rybelsus 7 mg daily.  Glipizide was discontinued 04/02 as HbA1c stable.  Recent A1c 6.4 and remains at goal. On statin and ARB.  Hyperlipidemia Taking Crestor 10 mg nightly and omega-3 fatty acids.  Tolerating medication well.  Mood disorder Would like to wean off Celexa.  Currently taking 10 mg daily.  Endorses mood good and situation now has changed.  Denies any SI/HI.  Continues to take BuSpar 7.5 mg twice daily.  Discussed weaning Celexa over a period of time and agreeable.  Sleep disorder Continues to have difficulty with sleep.  Requesting refill for Ambien  PERTINENT PMH / PSH: Mood disorder Hyperlipidemia Hypertension Diabetes type 2   OBJECTIVE:  BP 128/72   Pulse 86   Wt 143 lb (64.9 kg)   SpO2 96%   BMI 22.07 kg/m    Physical Exam Vitals reviewed.  Constitutional:      General: She is not in acute distress.    Appearance: Normal appearance. She is normal weight. She is not ill-appearing, toxic-appearing or diaphoretic.  HENT:     Right Ear: Tympanic membrane, ear canal and external ear normal.     Left Ear: Tympanic membrane, ear canal and external ear normal.  Eyes:     General:        Right eye: No discharge.        Left eye: No discharge.     Conjunctiva/sclera: Conjunctivae normal.  Neck:     Thyroid: No thyromegaly or thyroid tenderness.  Cardiovascular:     Rate and Rhythm: Normal rate and regular rhythm.     Heart sounds: Normal heart sounds.   Pulmonary:     Effort: Pulmonary effort is normal.     Breath sounds: Normal breath sounds.  Abdominal:     General: Bowel sounds are normal.  Musculoskeletal:        General: Normal range of motion.  Skin:    General: Skin is warm and dry.  Neurological:     General: No focal deficit present.     Mental Status: She is alert and oriented to person, place, and time. Mental status is at baseline.  Psychiatric:        Mood and Affect: Mood normal.        Behavior: Behavior normal.        Thought Content: Thought content normal.        Judgment: Judgment normal.     ASSESSMENT/PLAN:  Type 2 diabetes mellitus with complication, without long-term current use of insulin (HCC) Assessment & Plan: Chronic.  Asymptomatic.  Recent A1c 6.4 since discontinuation of glipizide Continue metformin XR 500 mg 2 tabs twice daily Continue Rybelsus 7 mg daily Repeat A1c in 3 month, if elevated will increase Rybelsus dose Blood pressure controlled, continue ARB Lipids at goal, continue statin Follows with ophthalmology, recent right cataract extraction and intraocular lens placement. Diabetic foot exam completed.  Due 05/25. Follow-up in 3 month   Orders: -     Hemoglobin A1c; Future  Hypertension associated with diabetes [facility]) Assessment & Plan: Chronic.  Well-controlled n current medications.  Recent creatinine normal Continue Norvasc 2.5 mg daily Continue Hyzaar 100-25 mg daily Monitor blood pressure at home, goal less than 140/90    Mood disorder (HCC) Assessment & Plan: Chronic.  Currently taking BuSpar 7.5 mg 2 times daily and Celexa 10 mg daily.  Would like to try weaning medication. Start weaning Celexa Continue BuSpar 7.5 mg twice daily Follow-up in 3 months or sooner if needed  Orders: -     Zolpidem Tartrate; Take 1 tablet (5 mg total) by mouth at bedtime as needed for sleep.  Dispense: 90 tablet; Refill: 1  Hyperlipidemia associated with type 2 diabetes mellitus  (HCC) Assessment & Plan: Chronic.  LDL at goal less than 50 On statin therapy and tolerating well.  Denies any myalgias. Continue Crestor 10 mg daily Triglycerides elevated, recommend low glycemic diet.   Insomnia, unspecified type Assessment & Plan: Chronic.  Long-term use of Ambien. Refill Ambien 5 mg nightly  Orders: -     Zolpidem Tartrate; Take 1 tablet (5 mg total) by mouth at bedtime as needed for sleep.  Dispense: 90 tablet; Refill: 1   PDMP reviewed  Return in about 3 months (around 05/20/2023) for PCP.  Dana Allan, MD

## 2023-02-17 NOTE — Patient Instructions (Addendum)
It was a pleasure seeing you today. Thank you for allowing me to take part in your health care.  Our goals for today as we discussed include:  Continue current medications for diabetes  Can decrease Celexa to 5 mg daily for 4 weeks, then 2.5 mg weekly for 4 weeks.  If doing well can take 2.5 mg every other day on week 9  Refill sent for Ambien  Follow up in 3 months.  Will repeat A1c   If you have any questions or concerns, please do not hesitate to call the office at [phone].  I look forward to our next visit and until then take care and stay safe.  Regards,   Dana Allan, MD   [facility]

## 2023-02-24 ENCOUNTER — Ambulatory Visit (INDEPENDENT_AMBULATORY_CARE_PROVIDER_SITE_OTHER): Payer: Medicare Other | Admitting: *Deleted

## 2023-02-24 DIAGNOSIS — Z0000 Encounter for general adult medical examination without abnormal findings: Secondary | ICD-10-CM

## 2023-02-24 NOTE — Patient Instructions (Signed)
Christy Stout , Thank you for taking time to come for your Medicare Wellness Visit. I appreciate your ongoing commitment to your health goals. Please review the following plan we discussed and let me know if I can assist you in the future.   Screening recommendations/referrals: Colonoscopy: up to date Mammogram: up to date Bone Density: Education provided Recommended yearly ophthalmology/optometry visit for glaucoma screening and checkup Recommended yearly dental visit for hygiene and checkup  Vaccinations: Influenza vaccine: up to date Pneumococcal vaccine: up to date Tdap vaccine: up to date Shingles vaccine: up to date    Advanced directives: not on file    Preventive Care 65 Years and Older, Female Preventive care refers to lifestyle choices and visits with your health care provider that can promote health and wellness. What does preventive care include? A yearly physical exam. This is also called an annual well check. Dental exams once or twice a year. Routine eye exams. Ask your health care provider how often you should have your eyes checked. Personal lifestyle choices, including: Daily care of your teeth and gums. Regular physical activity. Eating a healthy diet. Avoiding tobacco and drug use. Limiting alcohol use. Practicing safe sex. Taking low-dose aspirin every day. Taking vitamin and mineral supplements as recommended by your health care provider. What happens during an annual well check? The services and screenings done by your health care provider during your annual well check will depend on your age, overall health, lifestyle risk factors, and family history of disease. Counseling  Your health care provider may ask you questions about your: Alcohol use. Tobacco use. Drug use. Emotional well-being. Home and relationship well-being. Sexual activity. Eating habits. History of falls. Memory and ability to understand (cognition). Work and work  Astronomer. Reproductive health. Screening  You may have the following tests or measurements: Height, weight, and BMI. Blood pressure. Lipid and cholesterol levels. These may be checked every 5 years, or more frequently if you are over 37 years old. Skin check. Lung cancer screening. You may have this screening every year starting at age 20 if you have a 30-pack-year history of smoking and currently smoke or have quit within the past 15 years. Fecal occult blood test (FOBT) of the stool. You may have this test every year starting at age 76. Flexible sigmoidoscopy or colonoscopy. You may have a sigmoidoscopy every 5 years or a colonoscopy every 10 years starting at age 72. Hepatitis C blood test. Hepatitis B blood test. Sexually transmitted disease (STD) testing. Diabetes screening. This is done by checking your blood sugar (glucose) after you have not eaten for a while (fasting). You may have this done every 1-3 years. Bone density scan. This is done to screen for osteoporosis. You may have this done starting at age 42. Mammogram. This may be done every 1-2 years. Talk to your health care provider about how often you should have regular mammograms. Talk with your health care provider about your test results, treatment options, and if necessary, the need for more tests. Vaccines  Your health care provider may recommend certain vaccines, such as: Influenza vaccine. This is recommended every year. Tetanus, diphtheria, and acellular pertussis (Tdap, Td) vaccine. You may need a Td booster every 10 years. Zoster vaccine. You may need this after age 1. Pneumococcal 13-valent conjugate (PCV13) vaccine. One dose is recommended after age 68. Pneumococcal polysaccharide (PPSV23) vaccine. One dose is recommended after age 80. Talk to your health care provider about which screenings and vaccines you need and how  often you need them. This information is not intended to replace advice given to you by  your health care provider. Make sure you discuss any questions you have with your health care provider. Document Released: 10/17/2015 Document Revised: 06/09/2016 Document Reviewed: 07/22/2015 Elsevier Interactive Patient Education  2017 [employer].  Fall Prevention in the Home Falls can cause injuries. They can happen to people of all ages. There are many things you can do to make your home safe and to help prevent falls. What can I do on the outside of my home? Regularly fix the edges of walkways and driveways and fix any cracks. Remove anything that might make you trip as you walk through a door, such as a raised step or threshold. Trim any bushes or trees on the path to your home. Use bright outdoor lighting. Clear any walking paths of anything that might make someone trip, such as rocks or tools. Regularly check to see if handrails are loose or broken. Make sure that both sides of any steps have handrails. Any raised decks and porches should have guardrails on the edges. Have any leaves, snow, or ice cleared regularly. Use sand or salt on walking paths during winter. Clean up any spills in your garage right away. This includes oil or grease spills. What can I do in the bathroom? Use night lights. Install grab bars by the toilet and in the tub and shower. Do not use towel bars as grab bars. Use non-skid mats or decals in the tub or shower. If you need to sit down in the shower, use a plastic, non-slip stool. Keep the floor dry. Clean up any water that spills on the floor as soon as it happens. Remove soap buildup in the tub or shower regularly. Attach bath mats securely with double-sided non-slip rug tape. Do not have throw rugs and other things on the floor that can make you trip. What can I do in the bedroom? Use night lights. Make sure that you have a light by your bed that is easy to reach. Do not use any sheets or blankets that are too big for your bed. They should not hang  down onto the floor. Have a firm chair that has side arms. You can use this for support while you get dressed. Do not have throw rugs and other things on the floor that can make you trip. What can I do in the kitchen? Clean up any spills right away. Avoid walking on wet floors. Keep items that you use a lot in easy-to-reach places. If you need to reach something above you, use a strong step stool that has a grab bar. Keep electrical cords out of the way. Do not use floor polish or wax that makes floors slippery. If you must use wax, use non-skid floor wax. Do not have throw rugs and other things on the floor that can make you trip. What can I do with my stairs? Do not leave any items on the stairs. Make sure that there are handrails on both sides of the stairs and use them. Fix handrails that are broken or loose. Make sure that handrails are as long as the stairways. Check any carpeting to make sure that it is firmly attached to the stairs. Fix any carpet that is loose or worn. Avoid having throw rugs at the top or bottom of the stairs. If you do have throw rugs, attach them to the floor with carpet tape. Make sure that you have a  light switch at the top of the stairs and the bottom of the stairs. If you do not have them, ask someone to add them for you. What else can I do to help prevent falls? Wear shoes that: Do not have high heels. Have rubber bottoms. Are comfortable and fit you well. Are closed at the toe. Do not wear sandals. If you use a stepladder: Make sure that it is fully opened. Do not climb a closed stepladder. Make sure that both sides of the stepladder are locked into place. Ask someone to hold it for you, if possible. Clearly mark and make sure that you can see: Any grab bars or handrails. First and last steps. Where the edge of each step is. Use tools that help you move around (mobility aids) if they are needed. These  include: Canes. Walkers. Scooters. Crutches. Turn on the lights when you go into a dark area. Replace any light bulbs as soon as they burn out. Set up your furniture so you have a clear path. Avoid moving your furniture around. If any of your floors are uneven, fix them. If there are any pets around you, be aware of where they are. Review your medicines with your doctor. Some medicines can make you feel dizzy. This can increase your chance of falling. Ask your doctor what other things that you can do to help prevent falls. This information is not intended to replace advice given to you by your health care provider. Make sure you discuss any questions you have with your health care provider. Document Released: 07/17/2009 Document Revised: 02/26/2016 Document Reviewed: 10/25/2014 Elsevier Interactive Patient Education  2017 [employer].

## 2023-02-24 NOTE — Progress Notes (Signed)
Subjective:   Christy Stout is a 72 y.o. female who presents for Medicare Annual (Subsequent) preventive examination.  I connected with  Glynis Smiles on 02/24/23 by a telephone enabled telemedicine application and verified that I am speaking with the correct person using two identifiers.   I discussed the limitations of evaluation and management by telemedicine. The patient expressed understanding and agreed to proceed.  Patient location: home  Provider location: telephone/home     Review of Systems     Cardiac Risk Factors include: advanced age (>52men, >46 women);diabetes mellitus;family history of premature cardiovascular disease;hypertension     Objective:    Today's Vitals   There is no height or weight on file to calculate BMI.     02/24/2023    2:10 PM 11/03/2022    6:56 AM 10/20/2022   10:01 AM 06/02/2022   11:11 AM 02/22/2022    2:56 PM 11/10/2020    1:36 PM 04/28/2020    2:48 PM  Advanced Directives  Does Patient Have a Medical Advance Directive? Yes Yes Yes No Yes Yes   Type of Estate agent of [employer] Power of [place];Living will Healthcare Power of [place];Living will  Healthcare Power of [place];Living will Living will Healthcare Power of [place];Living will  Does patient want to make changes to medical advance directive?  No - Patient declined No - Patient declined  No - Patient declined No - Patient declined   Copy of Healthcare Power of Attorney in Chart? No - copy requested Yes - validated most recent copy scanned in chart (See row information) Yes - validated most recent copy scanned in chart (See row information)  No - copy requested    Would patient like information on creating a medical advance directive?    No - Patient declined       Current Medications (verified) Outpatient Encounter Medications as of 02/24/2023  Medication Sig   Accu-Chek Softclix Lancets lancets Used to check blood sugars once per day.  Softclix lancets or ask pt which she prefers   amLODipine (NORVASC) 2.5 MG tablet Take 1 tablet (2.5 mg total) by mouth daily.   aspirin EC 81 MG tablet Take 81 mg by mouth daily.   blood glucose meter kit and supplies KIT Dispense based on patient and insurance preference. Use up to four times daily as directed. (FOR ICD-9 250.00, 250.01).   blood glucose meter kit and supplies Dispense based on patient and insurance preference. Bid  directed. (FOR ICD-10 E10.9, E11.9). 1 year supply strips and lancets   Blood Glucose Monitoring Suppl (ACCU-CHEK GUIDE) w/Device KIT 1 Device by Does not apply route daily.   busPIRone (BUSPAR) 7.5 MG tablet Take 1 tablet (7.5 mg total) by mouth 2 (two) times daily.   citalopram (CELEXA) 10 MG tablet Take 1 tablet (10 mg total) by mouth daily.   glucose blood (ACCU-CHEK GUIDE) test strip Used to check blood sugar once per day E11.9.   loratadine (CLARITIN) 10 MG tablet Take 10 mg by mouth daily.   losartan-hydrochlorothiazide (HYZAAR) 100-25 MG tablet Take 1 tablet by mouth daily. In am   Magnesium 250 MG TABS Take by mouth daily.   meloxicam (MOBIC) 7.5 MG tablet Take 1 tablet (7.5 mg total) by mouth daily as needed for pain.   metFORMIN (GLUCOPHAGE-XR) 500 MG 24 hr tablet Take 2 tablets (1,000 mg total) by mouth 2 (two) times daily with a meal.   Multiple Vitamin (MULTI-VITAMIN DAILY PO) Take by mouth.  Omega-3 Fatty Acids (FISH OIL PO) Take by mouth.   rosuvastatin (CRESTOR) 10 MG tablet Take 1 tablet (10 mg total) by mouth at bedtime.   Semaglutide (RYBELSUS) 7 MG TABS Take 1 tablet by mouth daily. In 1 month   Vitamin D, Cholecalciferol, 1000 units CAPS Take 2,000 Int'l Units/day by mouth.    zolpidem (AMBIEN) 5 MG tablet Take 1 tablet (5 mg total) by mouth at bedtime as needed for sleep.   Facility-Administered Encounter Medications as of 02/24/2023  Medication   0.9 %  sodium chloride infusion    Allergies (verified) Lisinopril   History: Past  Medical History:  Diagnosis Date   Arthritis    Bacterial vaginosis    Breast cancer, right (HCC) 2000    lumpectomy 8 rounds chemo, 6 weeks radiation mammo 02/24/17 negative on Arimedex x 7 years    Cataract    COVID-19    04/13/21   Diabetes mellitus without complication (HCC)    Diverticulosis    GERD (gastroesophageal reflux disease)    History of chicken pox    Hyperlipidemia    Hypertension    Overweight (BMI 25.0-29.9) 04/03/2020   Personal history of chemotherapy 2000   Rt. Breast   Personal history of radiation therapy 2000   Rt. breast   URI, acute 12/08/2020   UTI (urinary tract infection)    UTI (urinary tract infection)    e coli 12/2020    Past Surgical History:  Procedure Laterality Date   BREAST EXCISIONAL BIOPSY Right 2000   +   BREAST SURGERY     2000   CATARACT EXTRACTION W/PHACO Left 10/20/2022   Procedure: CATARACT EXTRACTION PHACO AND INTRAOCULAR LENS PLACEMENT (IOC) LEFT DIABETIC CLAREON VIVITY LENS;  Surgeon: Lockie Mola, MD;  Location: [facility];  Service: Ophthalmology;  Laterality: Left;  12.12 1:10.6   CATARACT EXTRACTION W/PHACO Right 11/03/2022   Procedure: CATARACT EXTRACTION PHACO AND INTRAOCULAR LENS PLACEMENT (IOC) RIGHT DIABETIC CLAREON TORIC 9.45 00:56.9;  Surgeon: Lockie Mola, MD;  Location: [facility];  Service: Ophthalmology;  Laterality: Right;  Diabetic   WRIST SURGERY Left    Family History  Problem Relation Age of Onset   Colon cancer Mother 38   Heart disease Father    Stroke Father    Arthritis Brother    Diabetes Brother    Breast cancer Maternal Grandmother    Heart disease Maternal Grandfather    Arthritis Brother    Alcohol abuse Son    Hypertension Son    Esophageal cancer Neg Hx    Stomach cancer Neg Hx    Liver disease Neg Hx    Social History   Socioeconomic History   Marital status: Widowed    Spouse name: Not on file   Number of children: Not on file   Years of education:  Not on file   Highest education level: 12th grade  Occupational History   Not on file  Tobacco Use   Smoking status: Never   Smokeless tobacco: Never  Substance and Sexual Activity   Alcohol use: Yes    Comment: 2 drinks per month   Drug use: Not Currently   Sexual activity: Yes    Partners: Male  Other Topics Concern   Not on file  Social History Narrative   Twin sons 1 daughter    Married husband [name] x 20 years he died 04-08-22       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, back clerk retired    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 also used to live in [place], from [place]   Never smoker    3 pregnancys 4 live births   No guns, wears seat belt safe in relationship       Social Determinants of Health   Financial Resource Strain: Low Risk  (02/24/2023)   Overall Financial Resource Strain (CARDIA)    Difficulty of Paying Living Expenses: Not very hard  Food Insecurity: No Food Insecurity (02/24/2023)   Hunger Vital Sign    Worried About Running Out of Food in the Last Year: Never true    Ran Out of Food in the Last Year: Never true  Transportation Needs: No Transportation Needs (02/24/2023)   PRAPARE - Administrator, Civil Service (Medical): No    Lack of Transportation (Non-Medical): No  Physical Activity: Sufficiently Active (02/24/2023)   Exercise Vital Sign    Days of Exercise per Week: 4 days    Minutes of Exercise per Session: 40 min  Stress: No Stress Concern Present (02/24/2023)   [employer] of Occupational Health - Occupational Stress Questionnaire    Feeling of Stress : Only a little  Social Connections: Moderately Isolated (02/24/2023)   Social Connection and Isolation Panel [NHANES]    Frequency of Communication with Friends and Family: More than three times a week    Frequency of Social Gatherings with Friends and Family: Twice a week    Attends Religious Services: Never    Database administrator or Organizations: Yes    Attends Engineer, structural:  More than 4 times per year    Marital Status: Widowed    Tobacco Counseling Counseling given: Not Answered   Clinical Intake:  Pre-visit preparation completed: Yes  Pain : No/denies pain     Diabetes: Yes CBG done?: No Did pt. bring in CBG monitor from home?: No  How often do you need to have someone help you when you read instructions, pamphlets, or other written materials from your doctor or pharmacy?: 1 - Never  Diabetic?  Yes  Nutrition Risk Assessment:  Has the patient had any N/V/D within the last 2 months?  No  Does the patient have any non-healing wounds?  No  Has the patient had any unintentional weight loss or weight gain?  No   Diabetes:  Is the patient diabetic?  Yes  If diabetic, was a CBG obtained today?  No  Did the patient bring in their glucometer from home?  No  How often do you monitor your CBG's? 1 x day.   Financial Strains and Diabetes Management:  Are you having any financial strains with the device, your supplies or your medication? No .  Does the patient want to be seen by Chronic Care Management for management of their diabetes?  No  Would the patient like to be referred to a Nutritionist or for Diabetic Management?  No   Diabetic Exams:  Diabetic Eye Exam: Completed .  Pt has been advised about the importance in completing this exam. A referral has been placed today. Message sent to referral coordinator for scheduling purposes. Advised pt to expect a call from office referred to regarding appt.  Diabetic Foot Exam: . Pt has been advised about the importance in completing this exam.   Interpreter Needed?: No  Information entered by :: Remi Haggard LPN   Activities of Daily Living    02/24/2023    2:11 PM 02/21/2023    1:11 PM  In your present  state of health, do you have any difficulty performing the following activities:  Hearing? 0 0  Vision? 0 0  Difficulty concentrating or making decisions? 0 0  Walking or climbing stairs? 0 0   Dressing or bathing? 0 0  Doing errands, shopping? 0 0  Preparing Food and eating ? N N  Using the Toilet? N N  In the past six months, have you accidently leaked urine? N N  Do you have problems with loss of bowel control? N N  Managing your Medications? N N  Managing your Finances? N N  Housekeeping or managing your Housekeeping? N N    Patient Care Team: Dana Allan, MD as PCP - General (Family Medicine)  Indicate any recent Medical Services you may have received from other than [facility] providers in the past year (date may be approximate).     Assessment:   This is a routine wellness examination for [place].  Hearing/Vision screen Hearing Screening - Comments:: No trouble hearing Vision Screening - Comments:: [facility] Has had cataract surgery both eyes  Dietary issues and exercise activities discussed: Current Exercise Habits: Home exercise routine, Type of exercise: walking, Time (Minutes): 40, Frequency (Times/Week): 4, Weekly Exercise (Minutes/Week): 160, Intensity: Mild   Goals Addressed             This Visit's Progress    Increase physical activity       Lower A1C       Depression Screen    02/24/2023    2:15 PM 01/04/2023    1:05 PM 07/05/2022   11:13 AM 04/13/2022    9:55 AM 02/22/2022    2:54 PM 01/26/2022   10:55 AM 08/06/2021    1:58 PM  PHQ 2/9 Scores  PHQ - 2 Score 0 0 1 2 0 0 0  PHQ- 9 Score 1   6       Fall Risk    02/21/2023    1:11 PM 01/04/2023    1:05 PM 07/05/2022   11:13 AM 04/13/2022    9:55 AM 02/22/2022    2:57 PM  Fall Risk   Falls in the past year? 0 0 1 0 0  Number falls in past yr:  0 1 0 0  Injury with Fall?  0 0 0   Risk for fall due to :  No Fall Risks History of fall(s) No Fall Risks   Follow up  Falls evaluation completed Falls evaluation completed Falls evaluation completed Falls evaluation completed    FALL RISK PREVENTION PERTAINING TO THE HOME:  Any stairs in or around the home? No  If so, are there any without  handrails? No  Home free of loose throw rugs in walkways, pet beds, electrical cords, etc? Yes  Adequate lighting in your home to reduce risk of falls? Yes   ASSISTIVE DEVICES UTILIZED TO PREVENT FALLS:  Life alert? No  Use of a cane, walker or w/c? No  Grab bars in the bathroom? No  Shower chair or bench in shower? Yes  Elevated toilet seat or a handicapped toilet? Yes   TIMED UP AND GO:  Was the test performed? No .    Cognitive Function:        02/24/2023    2:12 PM 04/21/2020   12:49 PM  6CIT Screen  What Year? 0 points 0 points  What month? 0 points 0 points  What time? 0 points 0 points  Count back from 20 0 points 0 points  Months  in reverse 0 points 0 points  Repeat phrase 0 points 0 points  Total Score 0 points 0 points    Immunizations Immunization History  Administered Date(s) Administered   Fluad Quad(high Dose 65+) 06/15/2019, 06/23/2020, 06/24/2021, 07/05/2022   Influenza, High Dose Seasonal PF 06/13/2018   Moderna Covid-19 Vaccine Bivalent Booster 84yrs & up 07/10/2021   Moderna SARS-COV2 Booster Vaccination 01/02/2021   Moderna Sars-Covid-2 Vaccination 11/03/2019, 12/03/2019, 07/25/2020   PNEUMOCOCCAL CONJUGATE-20 01/04/2023   Pneumococcal Polysaccharide-23 03/16/2018   Tdap 11/21/2015   Zoster Recombinat (Shingrix) 05/14/2020, 09/05/2020    TDAP status: Up to date  Flu Vaccine status: Up to date  Pneumococcal vaccine status: Up to date  Covid-19 vaccine status: Information provided on how to obtain vaccines.   Qualifies for Shingles Vaccine? Yes   Zostavax completed Yes   Shingrix Completed?: Yes  Screening Tests Health Maintenance  Topic Date Due   COVID-19 Vaccine (5 - 2023-24 season) 03/12/2023 (Originally 06/04/2022)   INFLUENZA VACCINE  05/05/2023   HEMOGLOBIN A1C  08/13/2023   OPHTHALMOLOGY EXAM  09/07/2023   Colonoscopy  11/11/2023   Diabetic kidney evaluation - eGFR measurement  02/10/2024   Diabetic kidney evaluation - Urine  ACR  02/10/2024   FOOT EXAM  02/17/2024   Medicare Annual Wellness (AWV)  02/24/2024   MAMMOGRAM  05/18/2024   DTaP/Tdap/Td (2 - Td or Tdap) 11/20/2025   Pneumonia Vaccine 78+ Years old  Completed   DEXA SCAN  Completed   Hepatitis C Screening  Completed   Zoster Vaccines- Shingrix  Completed   HPV VACCINES  Aged Out    Health Maintenance  There are no preventive care reminders to display for this patient.   Colorectal cancer screening: Type of screening: Colonoscopy. Completed 2022. Repeat every 3 years  Mammogram status: Completed  . Repeat every year  Bone Density status: Completed 2018. Results reflect: Bone density results: NORMAL. Repeat every 5 years.  Lung Cancer Screening: (Low Dose CT Chest recommended if Age 12-80 years, 30 pack-year currently smoking OR have quit w/in 15years.) does not qualify.   Lung Cancer Screening Referral:   Additional Screening:  Hepatitis C Screening: does not qualify; Completed 2018  Vision Screening: Recommended annual ophthalmology exams for early detection of glaucoma and other disorders of the eye. Is the patient up to date with their annual eye exam?  Yes  Who is the provider or what is the name of the office in which the patient attends annual eye exams? [facility] If pt is not established with a provider, would they like to be referred to a provider to establish care? No .   Dental Screening: Recommended annual dental exams for proper oral hygiene  Community Resource Referral / Chronic Care Management: CRR required this visit?  No   CCM required this visit?  No      Plan:     I have personally reviewed and noted the following in the patient's chart:   Medical and social history Use of alcohol, tobacco or illicit drugs  Current medications and supplements including opioid prescriptions. Patient is not currently taking opioid prescriptions. Functional ability and status Nutritional status Physical activity Advanced  directives List of other physicians Hospitalizations, surgeries, and ER visits in previous 12 months Vitals Screenings to include cognitive, depression, and falls Referrals and appointments  In addition, I have reviewed and discussed with patient certain preventive protocols, quality metrics, and best practice recommendations. A written personalized care plan for preventive services as well as general  preventive health recommendations were provided to patient.     Remi Haggard, LPN   1/61/0960   Nurse Notes:

## 2023-03-05 NOTE — Assessment & Plan Note (Signed)
Chronic.  LDL at goal less than 50 On statin therapy and tolerating well.  Denies any myalgias. Continue Crestor 10 mg daily Triglycerides elevated, recommend low glycemic diet.

## 2023-03-05 NOTE — Assessment & Plan Note (Signed)
Chronic.  Long-term use of Ambien. Refill Ambien 5 mg nightly

## 2023-03-05 NOTE — Assessment & Plan Note (Signed)
Chronic.  Asymptomatic.  Recent A1c 6.4 since discontinuation of glipizide Continue metformin XR 500 mg 2 tabs twice daily Continue Rybelsus 7 mg daily Repeat A1c in 3 month, if elevated will increase Rybelsus dose Blood pressure controlled, continue ARB Lipids at goal, continue statin Follows with ophthalmology, recent right cataract extraction and intraocular lens placement. Diabetic foot exam completed.  Due 05/25. Follow-up in 3 month

## 2023-03-05 NOTE — Assessment & Plan Note (Signed)
Chronic.  Well-controlled n current medications.  Recent creatinine normal Continue Norvasc 2.5 mg daily Continue Hyzaar 100-25 mg daily Monitor blood pressure at home, goal less than 140/90

## 2023-03-05 NOTE — Assessment & Plan Note (Signed)
Chronic.  Currently taking BuSpar 7.5 mg 2 times daily and Celexa 10 mg daily.  Would like to try weaning medication. Start weaning Celexa Continue BuSpar 7.5 mg twice daily Follow-up in 3 months or sooner if needed

## 2023-03-08 DIAGNOSIS — D485 Neoplasm of uncertain behavior of skin: Secondary | ICD-10-CM | POA: Diagnosis not present

## 2023-03-08 DIAGNOSIS — D2262 Melanocytic nevi of left upper limb, including shoulder: Secondary | ICD-10-CM | POA: Diagnosis not present

## 2023-03-08 DIAGNOSIS — D2272 Melanocytic nevi of left lower limb, including hip: Secondary | ICD-10-CM | POA: Diagnosis not present

## 2023-03-08 DIAGNOSIS — D2261 Melanocytic nevi of right upper limb, including shoulder: Secondary | ICD-10-CM | POA: Diagnosis not present

## 2023-03-08 DIAGNOSIS — D225 Melanocytic nevi of trunk: Secondary | ICD-10-CM | POA: Diagnosis not present

## 2023-03-08 DIAGNOSIS — L821 Other seborrheic keratosis: Secondary | ICD-10-CM | POA: Diagnosis not present

## 2023-03-08 DIAGNOSIS — L570 Actinic keratosis: Secondary | ICD-10-CM | POA: Diagnosis not present

## 2023-03-08 DIAGNOSIS — D2271 Melanocytic nevi of right lower limb, including hip: Secondary | ICD-10-CM | POA: Diagnosis not present

## 2023-04-20 ENCOUNTER — Other Ambulatory Visit: Payer: Self-pay | Admitting: Family Medicine

## 2023-04-20 DIAGNOSIS — Z1231 Encounter for screening mammogram for malignant neoplasm of breast: Secondary | ICD-10-CM

## 2023-05-04 ENCOUNTER — Encounter (INDEPENDENT_AMBULATORY_CARE_PROVIDER_SITE_OTHER): Payer: Self-pay

## 2023-05-10 ENCOUNTER — Telehealth: Payer: Self-pay | Admitting: Family Medicine

## 2023-05-10 NOTE — Telephone Encounter (Signed)
Patient need lab orders, only thing pending is A1c and note says fasting labs. Thanks

## 2023-05-17 ENCOUNTER — Other Ambulatory Visit: Payer: Medicare Other

## 2023-05-17 DIAGNOSIS — E118 Type 2 diabetes mellitus with unspecified complications: Secondary | ICD-10-CM

## 2023-05-17 LAB — HEMOGLOBIN A1C: Hgb A1c MFr Bld: 6.4 % (ref 4.6–6.5)

## 2023-05-24 ENCOUNTER — Ambulatory Visit (INDEPENDENT_AMBULATORY_CARE_PROVIDER_SITE_OTHER): Payer: Medicare Other | Admitting: Family Medicine

## 2023-05-24 ENCOUNTER — Encounter: Payer: Self-pay | Admitting: Family Medicine

## 2023-05-24 VITALS — BP 124/60 | HR 76 | Temp 97.9°F | Resp 16 | Ht 67.5 in | Wt 140.2 lb

## 2023-05-24 DIAGNOSIS — I152 Hypertension secondary to endocrine disorders: Secondary | ICD-10-CM | POA: Diagnosis not present

## 2023-05-24 DIAGNOSIS — Z7984 Long term (current) use of oral hypoglycemic drugs: Secondary | ICD-10-CM

## 2023-05-24 DIAGNOSIS — F39 Unspecified mood [affective] disorder: Secondary | ICD-10-CM | POA: Diagnosis not present

## 2023-05-24 DIAGNOSIS — G4700 Insomnia, unspecified: Secondary | ICD-10-CM | POA: Diagnosis not present

## 2023-05-24 DIAGNOSIS — E1169 Type 2 diabetes mellitus with other specified complication: Secondary | ICD-10-CM

## 2023-05-24 DIAGNOSIS — E871 Hypo-osmolality and hyponatremia: Secondary | ICD-10-CM | POA: Diagnosis not present

## 2023-05-24 DIAGNOSIS — E785 Hyperlipidemia, unspecified: Secondary | ICD-10-CM

## 2023-05-24 DIAGNOSIS — E1159 Type 2 diabetes mellitus with other circulatory complications: Secondary | ICD-10-CM

## 2023-05-24 DIAGNOSIS — E118 Type 2 diabetes mellitus with unspecified complications: Secondary | ICD-10-CM

## 2023-05-24 LAB — BASIC METABOLIC PANEL
BUN: 19 mg/dL (ref 6–23)
CO2: 30 mEq/L (ref 19–32)
Calcium: 10.6 mg/dL — ABNORMAL HIGH (ref 8.4–10.5)
Chloride: 97 mEq/L (ref 96–112)
Creatinine, Ser: 0.55 mg/dL (ref 0.40–1.20)
GFR: 91.63 mL/min (ref >60.00)
Glucose, Bld: 131 mg/dL — ABNORMAL HIGH (ref 70–99)
Potassium: 4.8 mEq/L (ref 3.5–5.1)
Sodium: 137 mEq/L (ref 135–145)

## 2023-05-24 NOTE — Patient Instructions (Addendum)
It was a pleasure meeting you today. Thank you for allowing me to take part in your health care.  Our goals for today as we discussed include:  Check sodium level today  Continue current medications  Monitor blood pressure, if less than 90/60 notify MD  Follow up in November   If you have any questions or concerns, please do not hesitate to call the office at [phone].  I look forward to our next visit and until then take care and stay safe.  Regards,   Dana Allan, MD   [facility]

## 2023-05-24 NOTE — Progress Notes (Signed)
SUBJECTIVE:   Chief Complaint  Patient presents with   Medical Management of Chronic Issues    3 Month follow up   HPI Presents to clinic to for chronic care management.  No acute concerns today.  Hypertension Compliant with current medications.  Remains on Norvasc 2.5 mg and Hyzaar 100-25 mg daily.  Recent labs wnl   Diabetes type 2 Asymptomatic.  Currently on metformin XR 1 g twice daily, Rybelsus 7 mg daily.  Recent A1c 6.4 and remains at goal. On statin and ARB.  Hyperlipidemia Tolerating Crestor 10 mg nightly and omega-3 fatty acids.   Mood disorder Has weaned off Celexa and doing well.  Continues to take Buspar and doing well.  Mood has not been depressed.  Denies SI?HI  Sleep disorder Continues to have difficulty with sleep.  Controlled with Ambien 5 mg at bedtime.    PERTINENT PMH / PSH: Mood disorder Hyperlipidemia Hypertension Diabetes type 2   OBJECTIVE:  BP 124/60   Pulse 76   Temp 97.9 F (36.6 C)   Resp 16   Ht 5' 7.5" (1.715 m)   Wt 140 lb 4 oz (63.6 kg)   SpO2 97%   BMI 21.64 kg/m    Physical Exam Vitals reviewed.  Constitutional:      General: She is not in acute distress.    Appearance: Normal appearance. She is normal weight. She is not ill-appearing, toxic-appearing or diaphoretic.  Eyes:     General:        Right eye: No discharge.        Left eye: No discharge.     Conjunctiva/sclera: Conjunctivae normal.  Cardiovascular:     Rate and Rhythm: Normal rate and regular rhythm.     Heart sounds: Normal heart sounds.  Pulmonary:     Effort: Pulmonary effort is normal.     Breath sounds: Normal breath sounds.  Abdominal:     General: Bowel sounds are normal.  Musculoskeletal:        General: Normal range of motion.  Skin:    General: Skin is warm and dry.  Neurological:     General: No focal deficit present.     Mental Status: She is alert and oriented to person, place, and time. Mental status is at baseline.  Psychiatric:         Mood and Affect: Mood normal.        Behavior: Behavior normal.        Thought Content: Thought content normal.        Judgment: Judgment normal.       05/24/2023   11:28 AM 02/24/2023    2:15 PM 01/04/2023    1:05 PM 07/05/2022   11:13 AM 04/13/2022    9:55 AM  Depression screen PHQ 2/9  Decreased Interest 0 0 0 0 1  Down, Depressed, Hopeless 0 0 0 1 1  PHQ - 2 Score 0 0 0 1 2  Altered sleeping 0 1   1  Tired, decreased energy 0 0   1  Change in appetite 0 0   1  Feeling bad or failure about yourself  0 0   1  Trouble concentrating 0 0   0  Moving slowly or fidgety/restless 0 0   0  Suicidal thoughts 0 0   0  PHQ-9 Score 0 1   6  Difficult doing work/chores Not difficult at all Not difficult at all   Somewhat difficult  05/24/2023   11:28 AM 04/13/2022   11:22 AM 04/03/2020   10:45 AM  GAD 7 : Generalized Anxiety Score  Nervous, Anxious, on Edge 0 1 0  Control/stop worrying 0 1 0  Worry too much - different things 0 1 0  Trouble relaxing 0 1 0  Restless 0 0 0  Easily annoyed or irritable 0 1 0  Afraid - awful might happen 0 0 0  Total GAD 7 Score 0 5 0  Anxiety Difficulty Not difficult at all Somewhat difficult Not difficult at all      ASSESSMENT/PLAN:  Hyponatremia Assessment & Plan: Recent Na 132 Asymptomatic Likely from diuretic use Recheck Bmet   Orders: -     Basic metabolic panel  Hyperlipidemia associated with type 2 diabetes mellitus (HCC) Assessment & Plan: Chronic.  LDL at goal less than 50 Continue Crestor 10 mg daily Continue modified diet and increase activity as tolerated   Type 2 diabetes mellitus with complication, without long-term current use of insulin (HCC) Assessment & Plan: Chronic.  Asymptomatic.  Recent A1c 6.4  Continue metformin XR 500 mg 2 tabs twice daily, consider decreasing in future Continue Rybelsus 7 mg daily.   Blood pressure controlled, continue ARB Lipids at goal, continue statin Follow-up in 3  month    Hypertension associated with diabetes [facility]) Assessment & Plan: Chronic.  Well-controlled n current medications.   Continue Norvasc 2.5 mg daily Continue Hyzaar 100-25 mg daily Monitor blood pressure at home, goal less than 140/90    Mood disorder (HCC) Assessment & Plan: Chronic.  Has weaned off Celexa. PHQ9/GAD normal Discontinued Celexa Continue BuSpar 7.5 mg twice daily    Insomnia, unspecified type Assessment & Plan: Chronic.  Long-term use of Ambien. Continue Ambien 5 mg nightly Requires UDS and non opioid substance contract at next visit     PDMP reviewed  Return in about 3 months (around 08/24/2023).  Dana Allan, MD

## 2023-05-30 DIAGNOSIS — H43813 Vitreous degeneration, bilateral: Secondary | ICD-10-CM | POA: Diagnosis not present

## 2023-05-30 DIAGNOSIS — E119 Type 2 diabetes mellitus without complications: Secondary | ICD-10-CM | POA: Diagnosis not present

## 2023-05-30 DIAGNOSIS — Z961 Presence of intraocular lens: Secondary | ICD-10-CM | POA: Diagnosis not present

## 2023-05-30 LAB — HM DIABETES EYE EXAM

## 2023-05-31 ENCOUNTER — Ambulatory Visit
Admission: RE | Admit: 2023-05-31 | Discharge: 2023-05-31 | Disposition: A | Discharge status: Home / Self Care | Payer: Medicare Other | Source: Ambulatory Visit | Attending: Family Medicine | Admitting: Family Medicine

## 2023-05-31 DIAGNOSIS — Z1231 Encounter for screening mammogram for malignant neoplasm of breast: Secondary | ICD-10-CM | POA: Diagnosis present

## 2023-05-31 HISTORY — DX: Malignant neoplasm of unspecified site of unspecified female breast: C50.919

## 2023-06-10 ENCOUNTER — Encounter: Payer: Self-pay | Admitting: Family Medicine

## 2023-06-10 DIAGNOSIS — E871 Hypo-osmolality and hyponatremia: Secondary | ICD-10-CM | POA: Insufficient documentation

## 2023-06-10 NOTE — Assessment & Plan Note (Signed)
Chronic.  Has weaned off Celexa. PHQ9/GAD normal Discontinued Celexa Continue BuSpar 7.5 mg twice daily

## 2023-06-10 NOTE — Assessment & Plan Note (Signed)
Chronic.  Long-term use of Ambien. Continue Ambien 5 mg nightly Requires UDS and non opioid substance contract at next visit

## 2023-06-10 NOTE — Assessment & Plan Note (Signed)
Chronic.  Asymptomatic.  Recent A1c 6.4  Continue metformin XR 500 mg 2 tabs twice daily, consider decreasing in future Continue Rybelsus 7 mg daily.   Blood pressure controlled, continue ARB Lipids at goal, continue statin Follow-up in 3 month

## 2023-06-10 NOTE — Assessment & Plan Note (Signed)
Recent Na 132 Asymptomatic Likely from diuretic use Recheck Bmet

## 2023-06-10 NOTE — Assessment & Plan Note (Signed)
Chronic.  LDL at goal less than 50 Continue Crestor 10 mg daily Continue modified diet and increase activity as tolerated

## 2023-06-10 NOTE — Assessment & Plan Note (Signed)
Chronic.  Well-controlled n current medications.   Continue Norvasc 2.5 mg daily Continue Hyzaar 100-25 mg daily Monitor blood pressure at home, goal less than 140/90

## 2023-06-23 DIAGNOSIS — Z23 Encounter for immunization: Secondary | ICD-10-CM | POA: Diagnosis not present

## 2023-06-29 ENCOUNTER — Ambulatory Visit: Payer: Medicare Other

## 2023-06-30 ENCOUNTER — Ambulatory Visit (INDEPENDENT_AMBULATORY_CARE_PROVIDER_SITE_OTHER): Payer: Medicare Other

## 2023-06-30 DIAGNOSIS — Z23 Encounter for immunization: Secondary | ICD-10-CM | POA: Diagnosis not present

## 2023-08-02 ENCOUNTER — Encounter: Payer: Self-pay | Admitting: Family Medicine

## 2023-08-02 DIAGNOSIS — E119 Type 2 diabetes mellitus without complications: Secondary | ICD-10-CM

## 2023-08-02 DIAGNOSIS — I152 Hypertension secondary to endocrine disorders: Secondary | ICD-10-CM

## 2023-08-02 MED ORDER — RYBELSUS 7 MG PO TABS
1.0000 | ORAL_TABLET | Freq: Every day | ORAL | 3 refills | Status: DC
Start: 2023-08-02 — End: 2023-12-09

## 2023-08-11 ENCOUNTER — Other Ambulatory Visit: Payer: Self-pay

## 2023-08-11 ENCOUNTER — Encounter: Payer: Self-pay | Admitting: Family Medicine

## 2023-08-11 DIAGNOSIS — E1159 Type 2 diabetes mellitus with other circulatory complications: Secondary | ICD-10-CM

## 2023-08-11 DIAGNOSIS — M1710 Unilateral primary osteoarthritis, unspecified knee: Secondary | ICD-10-CM

## 2023-08-11 DIAGNOSIS — I10 Essential (primary) hypertension: Secondary | ICD-10-CM

## 2023-08-11 DIAGNOSIS — F4321 Adjustment disorder with depressed mood: Secondary | ICD-10-CM

## 2023-08-11 DIAGNOSIS — F4323 Adjustment disorder with mixed anxiety and depressed mood: Secondary | ICD-10-CM

## 2023-08-11 NOTE — Telephone Encounter (Signed)
Sent to provider for refill

## 2023-08-13 MED ORDER — AMLODIPINE BESYLATE 2.5 MG PO TABS: 2.5000 mg | ORAL_TABLET | Freq: Every day | ORAL | 3 refills | Status: DC

## 2023-08-13 MED ORDER — MELOXICAM 7.5 MG PO TABS: 7.5000 mg | ORAL_TABLET | Freq: Every day | ORAL | Status: DC | PRN

## 2023-08-13 MED ORDER — BUSPIRONE HCL 7.5 MG PO TABS: 7.5000 mg | ORAL_TABLET | Freq: Two times a day (BID) | ORAL | 3 refills | Status: DC

## 2023-08-13 MED ORDER — METFORMIN HCL ER 500 MG PO TB24
1000.0000 mg | ORAL_TABLET | Freq: Two times a day (BID) | ORAL | 3 refills | Status: DC
Start: 2023-08-13 — End: 2024-03-15

## 2023-08-13 MED ORDER — LOSARTAN POTASSIUM-HCTZ 100-25 MG PO TABS
1.0000 | ORAL_TABLET | Freq: Every day | ORAL | 3 refills | Status: DC
Start: 2023-08-13 — End: 2023-12-09

## 2023-08-18 ENCOUNTER — Other Ambulatory Visit: Payer: Self-pay

## 2023-08-18 ENCOUNTER — Encounter: Payer: Self-pay | Admitting: Family Medicine

## 2023-08-18 DIAGNOSIS — E1165 Type 2 diabetes mellitus with hyperglycemia: Secondary | ICD-10-CM

## 2023-08-18 MED ORDER — ROSUVASTATIN CALCIUM 10 MG PO TABS: 10.0000 mg | ORAL_TABLET | Freq: Every day | ORAL | 3 refills | Status: DC

## 2023-08-18 NOTE — Telephone Encounter (Signed)
Good afternoon the refills have been sent. If you have trouble getting them please let us know.

## 2023-08-23 ENCOUNTER — Telehealth: Payer: Self-pay | Admitting: Family Medicine

## 2023-08-23 NOTE — Telephone Encounter (Signed)
Patient need lab orders.

## 2023-08-24 ENCOUNTER — Ambulatory Visit: Payer: Medicare Other | Admitting: Family Medicine

## 2023-08-24 NOTE — Telephone Encounter (Signed)
Lvm for pt. Also mychart msg sent. Appt has been canceled

## 2023-08-29 ENCOUNTER — Other Ambulatory Visit: Payer: Medicare Other

## 2023-08-31 ENCOUNTER — Ambulatory Visit: Payer: Medicare Other | Admitting: Family Medicine

## 2023-09-09 ENCOUNTER — Ambulatory Visit: Payer: Medicare Other | Admitting: Family Medicine

## 2023-09-09 ENCOUNTER — Encounter: Payer: Self-pay | Admitting: Family Medicine

## 2023-09-09 VITALS — BP 132/70 | HR 73 | Temp 98.4°F | Resp 18 | Ht 67.5 in | Wt 139.4 lb

## 2023-09-09 DIAGNOSIS — E118 Type 2 diabetes mellitus with unspecified complications: Secondary | ICD-10-CM | POA: Diagnosis not present

## 2023-09-09 DIAGNOSIS — F39 Unspecified mood [affective] disorder: Secondary | ICD-10-CM | POA: Diagnosis not present

## 2023-09-09 DIAGNOSIS — Z7984 Long term (current) use of oral hypoglycemic drugs: Secondary | ICD-10-CM

## 2023-09-09 DIAGNOSIS — G4700 Insomnia, unspecified: Secondary | ICD-10-CM | POA: Diagnosis not present

## 2023-09-09 DIAGNOSIS — M1710 Unilateral primary osteoarthritis, unspecified knee: Secondary | ICD-10-CM

## 2023-09-09 LAB — POCT GLYCOSYLATED HEMOGLOBIN (HGB A1C): Hemoglobin A1C: 6.2 % — AB (ref 4.0–5.6)

## 2023-09-09 MED ORDER — MELOXICAM 7.5 MG PO TABS: 7.5000 mg | ORAL_TABLET | Freq: Every day | ORAL | 3 refills | Status: DC | PRN

## 2023-09-09 MED ORDER — MELOXICAM 7.5 MG PO TABS: 7.5000 mg | ORAL_TABLET | Freq: Every day | ORAL | Status: DC | PRN

## 2023-09-09 MED ORDER — ZOLPIDEM TARTRATE 5 MG PO TABS: 5.0000 mg | ORAL_TABLET | Freq: Every evening | ORAL | 1 refills | Status: DC | PRN

## 2023-09-09 NOTE — Patient Instructions (Addendum)
It was a pleasure meeting you today. Thank you for allowing me to take part in your health care.  Our goals for today as we discussed include:  Continue current medication    This is a list of the screening recommended for you and due dates:  Health Maintenance  Topic Date Due   COVID-19 Vaccine (6 - 2023-24 season) 08/18/2023   Colon Cancer Screening  11/11/2023   Hemoglobin A1C  11/17/2023   Yearly kidney health urinalysis for diabetes  02/10/2024   Complete foot exam   02/17/2024   Medicare Annual Wellness Visit  02/24/2024   Yearly kidney function blood test for diabetes  05/23/2024   Eye exam for diabetics  05/29/2024   Mammogram  05/30/2025   DTaP/Tdap/Td vaccine (2 - Td or Tdap) 11/20/2025   Pneumonia Vaccine  Completed   Flu Shot  Completed   DEXA scan (bone density measurement)  Completed   Hepatitis C Screening  Completed   Zoster (Shingles) Vaccine  Completed   HPV Vaccine  Aged Out    Follow up in 3 months   If you have any questions or concerns, please do not hesitate to call the office at [phone].  I look forward to our next visit and until then take care and stay safe.  Regards,   Dana Allan, MD   [facility]

## 2023-09-09 NOTE — Progress Notes (Unsigned)
SUBJECTIVE:   Chief Complaint  Patient presents with   Diabetes    3 month follow up   HPI Presents to clinic for follow up chronic disease management  Discussed the use of AI scribe software for clinical note transcription with the patient, who gave verbal consent to proceed.  History of Present Illness The patient, with a history of diabetes, presents for a routine follow-up. They report good adherence to their current regimen of Rybelsus 7mg  daily, which has significantly improved their morning blood glucose levels. Prior to starting Rybelsus, their morning readings were consistently between 180-190 mg/dL. However, since starting the medication, their morning readings have been between 120-132 mg/dL. They have not experienced any hypoglycemic events.  The patient also reports a change in their insurance plan, which will continue to cover their Rybelsus prescription. They have stocked up on their medication and currently have a six-month supply at home.  In addition to diabetes, the patient has arthritis for which they take Meloxicam as needed. They also take Zolpidem for sleep, and Buspar, with no reported issues. They have occasional constipation, but it is not severe or prolonged. They report a decrease in appetite and a reduced desire for alcohol since starting Rybelsus.  The patient has been proactive in managing their health, including getting their flu vaccine, the last COVID vaccine, and two pneumonia vaccines in the past. They have been maintaining their weight and trying to improve muscle tone through walking.    PERTINENT PMH / PSH: As above  OBJECTIVE:  BP 132/70   Pulse 73   Temp 98.4 F (36.9 C)   Resp 18   Ht 5' 7.5" (1.715 m)   Wt 139 lb 6 oz (63.2 kg)   SpO2 97%   BMI 21.51 kg/m    Physical Exam Vitals reviewed.  Constitutional:      General: She is not in acute distress.    Appearance: Normal appearance. She is normal weight. She is not ill-appearing,  toxic-appearing or diaphoretic.  Eyes:     General:        Right eye: No discharge.        Left eye: No discharge.     Conjunctiva/sclera: Conjunctivae normal.  Cardiovascular:     Rate and Rhythm: Normal rate and regular rhythm.     Heart sounds: Normal heart sounds.  Pulmonary:     Effort: Pulmonary effort is normal.     Breath sounds: Normal breath sounds.  Abdominal:     General: Bowel sounds are normal.  Musculoskeletal:        General: Normal range of motion.  Skin:    General: Skin is warm and dry.  Neurological:     General: No focal deficit present.     Mental Status: She is alert and oriented to person, place, and time. Mental status is at baseline.  Psychiatric:        Mood and Affect: Mood normal.        Behavior: Behavior normal.        Thought Content: Thought content normal.        Judgment: Judgment normal.        09/09/2023    2:48 PM 05/24/2023   11:28 AM 02/24/2023    2:15 PM 01/04/2023    1:05 PM 07/05/2022   11:13 AM  Depression screen PHQ 2/9  Decreased Interest 0 0 0 0 0  Down, Depressed, Hopeless 0 0 0 0 1  PHQ - 2 Score 0  0 0 0 1  Altered sleeping 0 0 1    Tired, decreased energy 0 0 0    Change in appetite 0 0 0    Feeling bad or failure about yourself  0 0 0    Trouble concentrating 0 0 0    Moving slowly or fidgety/restless 0 0 0    Suicidal thoughts 0 0 0    PHQ-9 Score 0 0 1    Difficult doing work/chores Not difficult at all Not difficult at all Not difficult at all        09/09/2023    2:48 PM 05/24/2023   11:28 AM 04/13/2022   11:22 AM 04/03/2020   10:45 AM  GAD 7 : Generalized Anxiety Score  Nervous, Anxious, on Edge 0 0 1 0  Control/stop worrying 0 0 1 0  Worry too much - different things 0 0 1 0  Trouble relaxing 0 0 1 0  Restless 0 0 0 0  Easily annoyed or irritable 0 0 1 0  Afraid - awful might happen 0 0 0 0  Total GAD 7 Score 0 0 5 0  Anxiety Difficulty Not difficult at all Not difficult at all Somewhat difficult Not  difficult at all    ASSESSMENT/PLAN:  Type 2 diabetes mellitus with complication, without long-term current use of insulin (HCC) Assessment & Plan: Improved fasting blood glucose levels with Rybelsus 7mg  daily. Discussed potential insurance changes and the possibility of switching to Ozempic if needed. -Check A1c today. -Continue Rybelsus 7mg  daily. -Plan to increase Rybelsus to 14mg  daily if A1c is not at goal.  Orders: -     POCT glycosylated hemoglobin (Hb A1C)  Arthritis of knee Assessment & Plan: Occasional use of Meloxicam for symptomatic relief. -Refill Meloxicam as needed.  Orders: -     Meloxicam; Take 1 tablet (7.5 mg total) by mouth daily as needed for pain.  Dispense: 30 tablet; Refill: 3  Insomnia, unspecified type Assessment & Plan: Chronic use of Zolpidem. -Refill Zolpidem.  Orders: -     Zolpidem Tartrate; Take 1 tablet (5 mg total) by mouth at bedtime as needed for sleep.  Dispense: 90 tablet; Refill: 1  Mood disorder (HCC) Assessment & Plan: Chronic.   -Continue BuSpar 7.5 mg twice daily   Orders: -     Zolpidem Tartrate; Take 1 tablet (5 mg total) by mouth at bedtime as needed for sleep.  Dispense: 90 tablet; Refill: 1    PDMP reviewed  Return in about 3 months (around 12/08/2023) for PCP, DM, HTN.  Dana Allan, MD

## 2023-09-12 ENCOUNTER — Encounter: Payer: Self-pay | Admitting: Family Medicine

## 2023-09-12 NOTE — Assessment & Plan Note (Signed)
Occasional use of Meloxicam for symptomatic relief. -Refill Meloxicam as needed.

## 2023-09-12 NOTE — Assessment & Plan Note (Signed)
Improved fasting blood glucose levels with Rybelsus 7mg  daily. Discussed potential insurance changes and the possibility of switching to Ozempic if needed. -Check A1c today. -Continue Rybelsus 7mg  daily. -Plan to increase Rybelsus to 14mg  daily if A1c is not at goal.

## 2023-09-12 NOTE — Assessment & Plan Note (Addendum)
Chronic.   -Continue BuSpar 7.5 mg twice daily

## 2023-09-12 NOTE — Assessment & Plan Note (Signed)
Chronic use of Zolpidem. -Refill Zolpidem.

## 2023-10-19 ENCOUNTER — Encounter: Payer: Self-pay | Admitting: Family Medicine

## 2023-10-21 ENCOUNTER — Telehealth: Payer: Self-pay

## 2023-10-21 NOTE — Telephone Encounter (Signed)
Pt requesting call back to schedule colonoscopy.

## 2023-10-24 ENCOUNTER — Other Ambulatory Visit: Payer: Self-pay

## 2023-10-24 ENCOUNTER — Telehealth: Payer: Self-pay

## 2023-10-24 DIAGNOSIS — Z860100 Personal history of colon polyps, unspecified: Secondary | ICD-10-CM

## 2023-10-24 DIAGNOSIS — Z800 Family history of malignant neoplasm of digestive organs: Secondary | ICD-10-CM

## 2023-10-24 MED ORDER — NA SULFATE-K SULFATE-MG SULF 17.5-3.13-1.6 GM/177ML PO SOLN: 1.0000 | Freq: Once | ORAL | 0 refills | Status: AC

## 2023-10-24 NOTE — Telephone Encounter (Signed)
Gastroenterology Pre-Procedure Review  Request Date: 11/18/23 Requesting Physician: Dr. Tobi Bastos  PATIENT REVIEW QUESTIONS: The patient responded to the following health history questions as indicated:    1. Are you having any GI issues? no 2. Do you have a personal history of Polyps? yes (Last colonoscopy performed by [facility] 11/10/20 polyps were present. Physician recommended repeat in 3 years) 3. Do you have a family history of Colon Cancer or Polyps? yes (mother colon cancer) 4. Diabetes Mellitus? yes (patient has been advised to stop rybelsus 7 days prior metformin 2 days prior glipizide 1 day prior noted on instructions) 5. Joint replacements in the past 12 months?no 6. Major health problems in the past 3 months?no 7. Any artificial heart valves, MVP, or defibrillator?no    MEDICATIONS & ALLERGIES:    Patient reports the following regarding taking any anticoagulation/antiplatelet therapy:   Plavix, Coumadin, Eliquis, Xarelto, Lovenox, Pradaxa, Brilinta, or Effient? no Aspirin? yes (81 mg daily)  Patient confirms/reports the following medications:  Current Outpatient Medications  Medication Sig Dispense Refill   glipiZIDE (GLUCOTROL XL) 10 MG 24 hr tablet SMARTSIG:1.0 Tablet(s) By Mouth Daily     Na Sulfate-K Sulfate-Mg Sulfate concentrate 17.5-3.13-1.6 GM/177ML SOLN Take 1 kit by mouth once for 1 dose. 354 mL 0   Accu-Chek Softclix Lancets lancets Used to check blood sugars once per day. Softclix lancets or ask pt which she prefers 100 each 4   amLODipine (NORVASC) 2.5 MG tablet Take 1 tablet (2.5 mg total) by mouth daily. 90 tablet 3   aspirin EC 81 MG tablet Take 81 mg by mouth daily.     blood glucose meter kit and supplies KIT Dispense based on patient and insurance preference. Use up to four times daily as directed. (FOR ICD-9 250.00, 250.01). 1 each 0   blood glucose meter kit and supplies Dispense based on patient and insurance preference. Bid  directed. (FOR ICD-10 E10.9, E11.9).  1 year supply strips and lancets 1 each 0   Blood Glucose Monitoring Suppl (ACCU-CHEK GUIDE) w/Device KIT 1 Device by Does not apply route daily. 1 kit 0   busPIRone (BUSPAR) 7.5 MG tablet Take 1 tablet (7.5 mg total) by mouth 2 (two) times daily. 180 tablet 3   glucose blood (ACCU-CHEK GUIDE) test strip Used to check blood sugar once per day E11.9. 100 each 5   loratadine (CLARITIN) 10 MG tablet Take 10 mg by mouth daily.     losartan-hydrochlorothiazide (HYZAAR) 100-25 MG tablet Take 1 tablet by mouth daily. In am 90 tablet 3   Magnesium 250 MG TABS Take by mouth daily.     meloxicam (MOBIC) 7.5 MG tablet Take 1 tablet (7.5 mg total) by mouth daily as needed for pain. 30 tablet 3   metFORMIN (GLUCOPHAGE-XR) 500 MG 24 hr tablet Take 2 tablets (1,000 mg total) by mouth 2 (two) times daily with a meal. 360 tablet 3   Multiple Vitamin (MULTI-VITAMIN DAILY PO) Take by mouth.     Omega-3 Fatty Acids (FISH OIL PO) Take by mouth.     rosuvastatin (CRESTOR) 10 MG tablet Take 1 tablet (10 mg total) by mouth at bedtime. 90 tablet 3   Semaglutide (RYBELSUS) 7 MG TABS Take 1 tablet (7 mg total) by mouth daily. In 1 month 90 tablet 3   Vitamin D, Cholecalciferol, 1000 units CAPS Take 2,000 Int'l Units/day by mouth.      zolpidem (AMBIEN) 5 MG tablet Take 1 tablet (5 mg total) by mouth at bedtime as needed  for sleep. 90 tablet 1   Current Facility-Administered Medications  Medication Dose Route Frequency Provider Last Rate Last Admin   0.9 %  sodium chloride infusion  500 mL Intravenous Once Sherrilyn Rist, MD        Patient confirms/reports the following allergies:  Allergies  Allergen Reactions   Lisinopril     Cough     Orders Placed This Encounter  Procedures   Ambulatory referral to Gastroenterology    Referral Priority:   Routine    Referral Type:   Consultation    Referral Reason:   Specialty Services Required    Number of Visits Requested:   1    AUTHORIZATION  INFORMATION Primary Insurance: 1D#: Group #:  Secondary Insurance: 1D#: Group #:  SCHEDULE INFORMATION: Date: 11/18/23 Time: Location: [facility]

## 2023-11-09 ENCOUNTER — Telehealth: Payer: Self-pay

## 2023-11-09 NOTE — Telephone Encounter (Signed)
Colonoscopy has been rescheduled from 11/18/23 to 12/23/23 at [facility] with Dr. Tobi Bastos.  Kim at [facility] notified.  Trish to be notified.  Instructions updated and referral updated.  Thanks,  [place], [place]

## 2023-11-09 NOTE — Telephone Encounter (Signed)
 The patient called in to reschedule her colonoscopy for 12/12/23 or 12/23/23.

## 2023-12-09 ENCOUNTER — Encounter: Payer: Self-pay | Admitting: Family Medicine

## 2023-12-09 ENCOUNTER — Ambulatory Visit: Payer: Medicare Other | Admitting: Family Medicine

## 2023-12-09 VITALS — BP 120/68 | HR 79 | Temp 98.2°F | Resp 20 | Ht 67.5 in | Wt 140.2 lb

## 2023-12-09 DIAGNOSIS — E118 Type 2 diabetes mellitus with unspecified complications: Secondary | ICD-10-CM | POA: Diagnosis not present

## 2023-12-09 DIAGNOSIS — E1169 Type 2 diabetes mellitus with other specified complication: Secondary | ICD-10-CM

## 2023-12-09 DIAGNOSIS — F39 Unspecified mood [affective] disorder: Secondary | ICD-10-CM | POA: Diagnosis not present

## 2023-12-09 DIAGNOSIS — Z7984 Long term (current) use of oral hypoglycemic drugs: Secondary | ICD-10-CM | POA: Diagnosis not present

## 2023-12-09 DIAGNOSIS — E1159 Type 2 diabetes mellitus with other circulatory complications: Secondary | ICD-10-CM

## 2023-12-09 DIAGNOSIS — E785 Hyperlipidemia, unspecified: Secondary | ICD-10-CM

## 2023-12-09 DIAGNOSIS — I152 Hypertension secondary to endocrine disorders: Secondary | ICD-10-CM

## 2023-12-09 LAB — POCT GLYCOSYLATED HEMOGLOBIN (HGB A1C): Hemoglobin A1C: 6.3 % — AB (ref 4.0–5.6)

## 2023-12-09 MED ORDER — AMLODIPINE BESYLATE 2.5 MG PO TABS: 2.5000 mg | ORAL_TABLET | Freq: Every day | ORAL | 3 refills | Status: DC

## 2023-12-09 MED ORDER — BUSPIRONE HCL 7.5 MG PO TABS: 7.5000 mg | ORAL_TABLET | Freq: Two times a day (BID) | ORAL | 3 refills | Status: DC

## 2023-12-09 MED ORDER — ROSUVASTATIN CALCIUM 10 MG PO TABS: 10.0000 mg | ORAL_TABLET | Freq: Every day | ORAL | 3 refills | Status: DC

## 2023-12-09 MED ORDER — LOSARTAN POTASSIUM-HCTZ 100-25 MG PO TABS: 1.0000 | ORAL_TABLET | Freq: Every day | ORAL | 3 refills | Status: DC

## 2023-12-09 MED ORDER — RYBELSUS 14 MG PO TABS
14.0000 mg | ORAL_TABLET | Freq: Every day | ORAL | 11 refills | Status: DC
Start: 2023-12-09 — End: 2024-03-15

## 2023-12-09 NOTE — Progress Notes (Signed)
 SUBJECTIVE:   Chief Complaint  Patient presents with   Medical Management of Chronic Issues    3 month follow up   HPI Presents for follow up chronic disease management  Discussed the use of AI scribe software for clinical note transcription with the patient, who gave verbal consent to proceed.  History of Present Illness The patient, with type 2 diabetes mellitus, presents for a three-month follow-up regarding diabetes management.  Her blood sugar levels are consistently elevated in the morning, even when she abstains from eating past 8:30 PM. This morning, her blood sugar was 151 mg/dL, and previously, her morning levels were in the 140s to low 150s. Throughout the day, her blood sugar typically measures in the 120s when checked two to three hours postprandially, but it never falls below 100 mg/dL.  Her current medication regimen includes Rybelsus 7 mg, which she has been on for approximately three years, and metformin, 1000 mg twice daily. She has about two months' supply of Rybelsus remaining and three months' supply of metformin. She is also taking losartan, Buspar, amlodipine, and Crestor, and she occasionally uses Mobic. She manages her medications through mail order with [facility], except for Rybelsus, which she prefers to pick up due to its cost.  Her diet typically consists of meat and vegetables for dinner, but she sometimes consumes popcorn or other carbohydrates at night, which she acknowledges may contribute to higher morning blood sugar levels. She occasionally snacks at night.      PERTINENT PMH / PSH: As above  OBJECTIVE:  BP 120/68   Pulse 79   Temp 98.2 F (36.8 C)   Resp 20   Ht 5' 7.5" (1.715 m)   Wt 140 lb 4 oz (63.6 kg)   SpO2 98%   BMI 21.64 kg/m    Physical Exam Vitals reviewed.  Constitutional:      General: She is not in acute distress.    Appearance: Normal appearance. She is normal weight. She is not ill-appearing, toxic-appearing or  diaphoretic.  Eyes:     General:        Right eye: No discharge.        Left eye: No discharge.     Conjunctiva/sclera: Conjunctivae normal.  Cardiovascular:     Rate and Rhythm: Normal rate and regular rhythm.     Heart sounds: Normal heart sounds.  Pulmonary:     Effort: Pulmonary effort is normal.     Breath sounds: Normal breath sounds.  Abdominal:     General: Bowel sounds are normal.  Musculoskeletal:        General: Normal range of motion.  Skin:    General: Skin is warm and dry.  Neurological:     General: No focal deficit present.     Mental Status: She is alert and oriented to person, place, and time. Mental status is at baseline.  Psychiatric:        Mood and Affect: Mood normal.        Behavior: Behavior normal.        Thought Content: Thought content normal.        Judgment: Judgment normal.           12/09/2023   11:34 AM 09/09/2023    2:48 PM 05/24/2023   11:28 AM 02/24/2023    2:15 PM 01/04/2023    1:05 PM  Depression screen PHQ 2/9  Decreased Interest 0 0 0 0 0  Down, Depressed, Hopeless 0 0 0 0  0  PHQ - 2 Score 0 0 0 0 0  Altered sleeping 0 0 0 1   Tired, decreased energy 0 0 0 0   Change in appetite 0 0 0 0   Feeling bad or failure about yourself  0 0 0 0   Trouble concentrating 0 0 0 0   Moving slowly or fidgety/restless 0 0 0 0   Suicidal thoughts 0 0 0 0   PHQ-9 Score 0 0 0 1   Difficult doing work/chores Not difficult at all Not difficult at all Not difficult at all Not difficult at all       12/09/2023   11:34 AM 09/09/2023    2:48 PM 05/24/2023   11:28 AM 04/13/2022   11:22 AM  GAD 7 : Generalized Anxiety Score  Nervous, Anxious, on Edge 0 0 0 1  Control/stop worrying 0 0 0 1  Worry too much - different things 0 0 0 1  Trouble relaxing 0 0 0 1  Restless 0 0 0 0  Easily annoyed or irritable 0 0 0 1  Afraid - awful might happen 0 0 0 0  Total GAD 7 Score 0 0 0 5  Anxiety Difficulty Not difficult at all Not difficult at all Not difficult at  all Somewhat difficult    ASSESSMENT/PLAN:  Type 2 diabetes mellitus with complication, without long-term current use of insulin (HCC) Assessment & Plan: A1c previously at 6.2, fasting glucose 140s-150s, likely due to nighttime carbohydrates. Goal: A1c 6.0-7.0 without hypoglycemia. - Increase Rybelsus to 14 mg if A1c stable. - Decrease metformin to 500 mg BID for two weeks, then 500 mg QD for two weeks, discontinue if stable. - Monitor fasting glucose daily. - Avoid nighttime carbohydrates. - Reassess A1c for treatment adjustments.  Orders: -     POCT glycosylated hemoglobin (Hb A1C) -     Rybelsus; Take 1 tablet (14 mg total) by mouth daily.  Dispense: 30 tablet; Refill: 11  Hypertension associated with diabetes (HCC) Assessment & Plan: Blood pressure well-controlled with current medications. - Continue losartan, amlodipine, Hyzaar. - Refill losartan, amlodipine, Hyzaar.  Orders: -     amLODIPine Besylate; Take 1 tablet (2.5 mg total) by mouth daily.  Dispense: 90 tablet; Refill: 3 -     Losartan Potassium-HCTZ; Take 1 tablet by mouth daily. In am  Dispense: 90 tablet; Refill: 3  Hyperlipidemia associated with type 2 diabetes mellitus (HCC) Assessment & Plan: No changes needed in lipid-lowering therapy. - Continue Crestor. - Refill Crestor.  Orders: -     Rosuvastatin Calcium; Take 1 tablet (10 mg total) by mouth at bedtime.  Dispense: 90 tablet; Refill: 3  Mood disorder (HCC) Assessment & Plan: Managed with  -Refill BuSpar 7.5 mg twice daily   Orders: -     busPIRone HCl; Take 1 tablet (7.5 mg total) by mouth 2 (two) times daily.  Dispense: 180 tablet; Refill: 3   PDMP reviewed  Return in about 3 months (around 03/10/2024) for PCP, DM.  Dana Allan, MD

## 2023-12-09 NOTE — Patient Instructions (Addendum)
 It was a pleasure meeting you today. Thank you for allowing me to take part in your health care.  Our goals for today as we discussed include:  A1c today is 6.3 Stop Rybelsus 7 mg Start Rybelsus 14 mg daily Decrease Metformin to 500 mg two times a day for 1-2 weeks.  If blood glucose remains less than 150 can decrease to 500 mg daily for 1-2 weeks.  If continues to remain less than 150 stop Metformin.   Follow up in 3 months  Refill sent for requested medications  This is a list of the screening recommended for you and due dates:  Health Maintenance  Topic Date Due   COVID-19 Vaccine (6 - 2024-25 season) 08/18/2023   Colon Cancer Screening  11/11/2023   Yearly kidney health urinalysis for diabetes  02/10/2024   Complete foot exam   02/17/2024   Medicare Annual Wellness Visit  02/24/2024   Yearly kidney function blood test for diabetes  05/23/2024   Eye exam for diabetics  05/29/2024   Hemoglobin A1C  06/10/2024   Mammogram  05/30/2025   DTaP/Tdap/Td vaccine (2 - Td or Tdap) 11/20/2025   Pneumonia Vaccine  Completed   Flu Shot  Completed   DEXA scan (bone density measurement)  Completed   Hepatitis C Screening  Completed   Zoster (Shingles) Vaccine  Completed   HPV Vaccine  Aged Out        If you have any questions or concerns, please do not hesitate to call the office at [phone].  I look forward to our next visit and until then take care and stay safe.  Regards,   Dana Allan, MD   [facility]

## 2023-12-13 ENCOUNTER — Other Ambulatory Visit (HOSPITAL_COMMUNITY): Payer: Self-pay

## 2023-12-13 ENCOUNTER — Telehealth: Payer: Self-pay

## 2023-12-13 NOTE — Telephone Encounter (Signed)
 Pharmacy Patient Advocate Encounter   Received notification from Onbase that prior authorization for Rybelsus 14MG  tablets is required/requested.   Insurance verification completed.   The patient is insured through [facility] .   Per test claim: Refill too soon. PA is not needed at this time. Medication was filled 12/12/2023. Next eligible fill date is 01/04/2024.

## 2023-12-14 ENCOUNTER — Other Ambulatory Visit (HOSPITAL_COMMUNITY): Payer: Self-pay

## 2023-12-14 ENCOUNTER — Encounter: Payer: Self-pay | Admitting: Family Medicine

## 2023-12-14 ENCOUNTER — Encounter: Payer: Self-pay | Admitting: Gastroenterology

## 2023-12-14 NOTE — Assessment & Plan Note (Signed)
 Managed with  -Refill BuSpar 7.5 mg twice daily

## 2023-12-14 NOTE — Assessment & Plan Note (Signed)
 No changes needed in lipid-lowering therapy. - Continue Crestor. - Refill Crestor.

## 2023-12-14 NOTE — Assessment & Plan Note (Signed)
 A1c previously at 6.2, fasting glucose 140s-150s, likely due to nighttime carbohydrates. Goal: A1c 6.0-7.0 without hypoglycemia. - Increase Rybelsus to 14 mg if A1c stable. - Decrease metformin to 500 mg BID for two weeks, then 500 mg QD for two weeks, discontinue if stable. - Monitor fasting glucose daily. - Avoid nighttime carbohydrates. - Reassess A1c for treatment adjustments.

## 2023-12-14 NOTE — Assessment & Plan Note (Signed)
 Blood pressure well-controlled with current medications. - Continue losartan, amlodipine, Hyzaar. - Refill losartan, amlodipine, Hyzaar.

## 2023-12-23 ENCOUNTER — Ambulatory Visit: Payer: Self-pay | Admitting: Anesthesiology

## 2023-12-23 ENCOUNTER — Encounter: Payer: Self-pay | Admitting: Gastroenterology

## 2023-12-23 ENCOUNTER — Other Ambulatory Visit: Payer: Self-pay

## 2023-12-23 ENCOUNTER — Encounter
Admission: RE | Disposition: A | Discharge status: Home / Self Care | Payer: Self-pay | Source: Home / Self Care | Attending: Gastroenterology

## 2023-12-23 ENCOUNTER — Ambulatory Visit
Admission: RE | Admit: 2023-12-23 | Discharge: 2023-12-23 | Disposition: A | Discharge status: Home / Self Care | Payer: Medicare Other | Attending: Gastroenterology | Admitting: Gastroenterology

## 2023-12-23 DIAGNOSIS — Z1211 Encounter for screening for malignant neoplasm of colon: Secondary | ICD-10-CM

## 2023-12-23 DIAGNOSIS — Z860100 Personal history of colon polyps, unspecified: Secondary | ICD-10-CM

## 2023-12-23 DIAGNOSIS — K5730 Diverticulosis of large intestine without perforation or abscess without bleeding: Secondary | ICD-10-CM | POA: Insufficient documentation

## 2023-12-23 DIAGNOSIS — E119 Type 2 diabetes mellitus without complications: Secondary | ICD-10-CM | POA: Insufficient documentation

## 2023-12-23 DIAGNOSIS — D126 Benign neoplasm of colon, unspecified: Secondary | ICD-10-CM

## 2023-12-23 DIAGNOSIS — E118 Type 2 diabetes mellitus with unspecified complications: Secondary | ICD-10-CM

## 2023-12-23 DIAGNOSIS — Z800 Family history of malignant neoplasm of digestive organs: Secondary | ICD-10-CM | POA: Diagnosis not present

## 2023-12-23 DIAGNOSIS — K219 Gastro-esophageal reflux disease without esophagitis: Secondary | ICD-10-CM | POA: Insufficient documentation

## 2023-12-23 DIAGNOSIS — I10 Essential (primary) hypertension: Secondary | ICD-10-CM | POA: Insufficient documentation

## 2023-12-23 DIAGNOSIS — D122 Benign neoplasm of ascending colon: Secondary | ICD-10-CM | POA: Insufficient documentation

## 2023-12-23 DIAGNOSIS — Z09 Encounter for follow-up examination after completed treatment for conditions other than malignant neoplasm: Secondary | ICD-10-CM | POA: Diagnosis not present

## 2023-12-23 DIAGNOSIS — D120 Benign neoplasm of cecum: Secondary | ICD-10-CM | POA: Insufficient documentation

## 2023-12-23 DIAGNOSIS — K635 Polyp of colon: Secondary | ICD-10-CM | POA: Diagnosis not present

## 2023-12-23 DIAGNOSIS — Z860101 Personal history of adenomatous and serrated colon polyps: Secondary | ICD-10-CM | POA: Diagnosis not present

## 2023-12-23 DIAGNOSIS — I2510 Atherosclerotic heart disease of native coronary artery without angina pectoris: Secondary | ICD-10-CM | POA: Diagnosis not present

## 2023-12-23 HISTORY — DX: Unspecified mood (affective) disorder: F39

## 2023-12-23 HISTORY — PX: COLONOSCOPY WITH PROPOFOL: SHX5780

## 2023-12-23 HISTORY — DX: Hypertension secondary to endocrine disorders: I15.2

## 2023-12-23 HISTORY — DX: Type 2 diabetes mellitus with unspecified complications: E11.8

## 2023-12-23 HISTORY — PX: POLYPECTOMY: SHX5525

## 2023-12-23 HISTORY — DX: Type 2 diabetes mellitus with other circulatory complications: E11.59

## 2023-12-23 LAB — GLUCOSE, CAPILLARY: Glucose-Capillary: 138 mg/dL — ABNORMAL HIGH (ref 70–99)

## 2023-12-23 SURGERY — COLONOSCOPY WITH PROPOFOL
Anesthesia: General | Site: Rectum

## 2023-12-23 MED ORDER — STERILE WATER FOR IRRIGATION IR SOLN
Status: DC | PRN
  Administered 2023-12-23: 1

## 2023-12-23 MED ORDER — LACTATED RINGERS IV SOLN: INTRAVENOUS | Status: DC

## 2023-12-23 MED ORDER — STERILE WATER FOR IRRIGATION IR SOLN
Status: DC | PRN
  Administered 2023-12-23: 60 mL

## 2023-12-23 MED ORDER — LIDOCAINE HCL (PF) 2 % IJ SOLN
INTRAMUSCULAR | Status: AC
  Filled 2023-12-23: qty 5

## 2023-12-23 MED ORDER — SODIUM CHLORIDE 0.9% FLUSH
3.0000 mL | INTRAVENOUS | Status: DC | PRN
Start: 2023-12-23 — End: 2023-12-23

## 2023-12-23 MED ORDER — LIDOCAINE HCL (CARDIAC) PF 100 MG/5ML IV SOSY
PREFILLED_SYRINGE | INTRAVENOUS | Status: DC | PRN
Start: 2023-12-23 — End: 2023-12-23
  Administered 2023-12-23: 40 mg via INTRATRACHEAL

## 2023-12-23 MED ORDER — ACETAMINOPHEN 160 MG/5ML PO SOLN: 325.0000 mg | ORAL | Status: DC | PRN

## 2023-12-23 MED ORDER — SODIUM CHLORIDE 0.9% FLUSH: 3.0000 mL | Freq: Two times a day (BID) | INTRAVENOUS | Status: DC

## 2023-12-23 MED ORDER — PROPOFOL 1000 MG/100ML IV EMUL
INTRAVENOUS | Status: AC
  Filled 2023-12-23: qty 100

## 2023-12-23 MED ORDER — ACETAMINOPHEN 325 MG PO TABS: 650.0000 mg | ORAL_TABLET | Freq: Once | ORAL | Status: DC | PRN

## 2023-12-23 MED ORDER — PROPOFOL 10 MG/ML IV BOLUS
INTRAVENOUS | Status: DC | PRN
  Administered 2023-12-23: 20 mg via INTRAVENOUS
  Administered 2023-12-23 (×3): 40 mg via INTRAVENOUS
  Administered 2023-12-23 (×5): 20 mg via INTRAVENOUS

## 2023-12-23 MED ORDER — ONDANSETRON HCL 4 MG/2ML IJ SOLN: 4.0000 mg | Freq: Once | INTRAMUSCULAR | Status: DC | PRN

## 2023-12-23 SURGICAL SUPPLY — 17 items
CLIP HMST 235XBRD CATH ROT (MISCELLANEOUS) IMPLANT
ELECT REM PT RETURN 9FT ADLT (ELECTROSURGICAL) IMPLANT
ELECTRODE REM PT RTRN 9FT ADLT (ELECTROSURGICAL) IMPLANT
FORCEPS BIOP RAD 4 LRG CAP 4 (CUTTING FORCEPS) IMPLANT
GAUZE SPONGE 4X4 12PLY STRL (GAUZE/BANDAGES/DRESSINGS) IMPLANT
GOWN CVR UNV OPN BCK APRN NK (MISCELLANEOUS) ×4 IMPLANT
INJECTOR VARIJECT VIN23 (MISCELLANEOUS) IMPLANT
KIT DEFENDO VALVE AND CONN (KITS) IMPLANT
KIT PRC NS LF DISP ENDO (KITS) ×2 IMPLANT
MANIFOLD NEPTUNE II (INSTRUMENTS) IMPLANT
MARKER SPOT ENDO TATTOO 5ML (MISCELLANEOUS) IMPLANT
PROBE APC STR FIRE (PROBE) IMPLANT
RETRIEVER NET ROTH 2.5X230 LF (MISCELLANEOUS) IMPLANT
SNARE COLD EXACTO (MISCELLANEOUS) IMPLANT
TRAP ETRAP POLY (MISCELLANEOUS) IMPLANT
VARIJECT INJECTOR VIN23 (MISCELLANEOUS) IMPLANT
WATER STERILE IRR 250ML POUR (IV SOLUTION) ×2 IMPLANT

## 2023-12-23 NOTE — Anesthesia Postprocedure Evaluation (Signed)
 Anesthesia Post Note  Patient: Christy Stout  Procedure(s) Performed: COLONOSCOPY WITH PROPOFOL (Rectum) POLYPECTOMY (Rectum)  Patient location during evaluation: PACU Anesthesia Type: General Level of consciousness: awake and alert Pain management: pain level controlled Vital Signs Assessment: post-procedure vital signs reviewed and stable Respiratory status: spontaneous breathing, nonlabored ventilation, respiratory function stable and patient connected to nasal cannula oxygen Cardiovascular status: blood pressure returned to baseline and stable Postop Assessment: no apparent nausea or vomiting Anesthetic complications: no   No notable events documented.   Last Vitals:  Vitals:   12/23/23 0809 12/23/23 0810  BP: 94/66   Pulse:  65  Resp:  16  Temp:  36.8 C  SpO2:  97%    Last Pain:  Vitals:   12/23/23 0810  TempSrc:   PainSc: 0-No pain                 Lenard Simmer

## 2023-12-23 NOTE — Op Note (Signed)
 [facility] Gastroenterology Patient Name: Christy Stout Procedure Date: 12/23/2023 7:38 AM MRN: [id number] Account #: [id number] Date of Birth: 1951-03-15 Admit Type: Outpatient Age: 73 Room: [facility] OR ROOM 01 Gender: Female Note Status: Finalized Instrument Name: [id number] Procedure:             Colonoscopy Indications:           Surveillance: Personal history of adenomatous polyps                         on last colonoscopy 3 years ago, Last colonoscopy:                         February 2022 Providers:             [name] MD, MD Referring MD:          Dana Allan (Referring MD) Medicines:             Monitored Anesthesia Care Complications:         No immediate complications. Procedure:             Pre-Anesthesia Assessment:                        - Prior to the procedure, a History and Physical was                         performed, and patient medications, allergies and                         sensitivities were reviewed. The patient's tolerance                         of previous anesthesia was reviewed.                        - The risks and benefits of the procedure and the                         sedation options and risks were discussed with the                         patient. All questions were answered and informed                         consent was obtained.                        - ASA Grade Assessment: II - A patient with mild                         systemic disease.                        After obtaining informed consent, the colonoscope was                         passed under direct vision. Throughout the procedure,                         the patient's blood pressure, pulse, and  oxygen                         saturations were monitored continuously. The                         Colonoscope was introduced through the anus and                         advanced to the the cecum, identified by the                         appendiceal orifice.  The colonoscopy was performed                         with ease. The patient tolerated the procedure well.                         The quality of the bowel preparation was excellent.                         The ileocecal valve, appendiceal orifice, and rectum                         were photographed. Findings:      The perianal and digital rectal examinations were normal.      Multiple medium-mouthed diverticula were found in the entire colon.      Two sessile polyps were found in the cecum. The polyps were 4 to 5 mm in       size. These polyps were removed with a cold snare. Resection was       complete, and retrieval was complete. one polyp was at prior polypectomy       site      Four sessile polyps were found in the ascending colon. The polyps were 5       to 6 mm in size. These polyps were removed with a cold snare. Resection       and retrieval were complete. To prevent bleeding after the polypectomy,       one hemostatic clip was successfully placed. Clip manufacturer: [employer]. There was no bleeding at the end of the procedure.      The exam was otherwise without abnormality on direct and retroflexion       views. Impression:            - Diverticulosis in the entire examined colon.                        - Two 4 to 5 mm polyps in the cecum, removed with a                         cold snare. Resected and retrieved.                        - Four 5 to 6 mm polyps in the ascending colon,                         removed with a cold snare. Resected and retrieved.  Clip was placed. Clip manufacturer: [employer].                        - The examination was otherwise normal on direct and                         retroflexion views. Recommendation:        - Discharge patient to home (with escort).                        - Resume previous diet.                        - Continue present medications.                        - Await pathology  results.                        - Repeat colonoscopy in 3 - 5 years for surveillance                         based on pathology results. Procedure Code(s):     --- Professional ---                        [phone], Colonoscopy, flexible; with removal of                         tumor(s), polyp(s), or other lesion(s) by snare                         technique Diagnosis Code(s):     --- Professional ---                        Z86.010, Personal history of colonic polyps                        D12.0, Benign neoplasm of cecum                        D12.2, Benign neoplasm of ascending colon                        K57.30, Diverticulosis of large intestine without                         perforation or abscess without bleeding CPT copyright 2022 American Medical Association. All rights reserved. The codes documented in this report are preliminary and upon coder review may  be revised to meet current compliance requirements. [name], MD [name] MD, MD 12/23/2023 8:09:40 AM This report has been signed electronically. Number of Addenda: 0 Note Initiated On: 12/23/2023 7:38 AM Scope Withdrawal Time: 0 hours 17 minutes 15 seconds  Total Procedure Duration: 0 hours 22 minutes 26 seconds  Estimated Blood Loss:  Estimated blood loss: none.      [facility]

## 2023-12-23 NOTE — Transfer of Care (Signed)
 Immediate Anesthesia Transfer of Care Note  Patient: Christy Stout  Procedure(s) Performed: COLONOSCOPY WITH PROPOFOL (Rectum) POLYPECTOMY (Rectum)  Patient Location: PACU  Anesthesia Type: General  Level of Consciousness: awake, alert  and patient cooperative  Airway and Oxygen Therapy: Patient Spontanous Breathing and Patient connected to supplemental oxygen  Post-op Assessment: Post-op Vital signs reviewed, Patient's Cardiovascular Status Stable, Respiratory Function Stable, Patent Airway and No signs of Nausea or vomiting  Post-op Vital Signs: Reviewed and stable  Complications: No notable events documented.

## 2023-12-23 NOTE — H&P (Signed)
 Christy Mood, MD [address], [address], [place], [place], [address] [address], [address], [place], [place], [address] Phone: [phone]  Fax: [phone]  Primary Care Physician:  Christy Allan, MD   Pre-Procedure History & Physical: HPI:  Christy Stout is a 73 y.o. female is here for an colonoscopy.   Past Medical History:  Diagnosis Date   Arthritis    Bacterial vaginosis    Breast cancer (HCC)    Breast cancer, right (HCC) 2000    lumpectomy 8 rounds chemo, 6 weeks radiation mammo 02/24/17 negative on Arimedex x 7 years    Cataract    COVID-19    04/13/21   Diverticulosis    GERD (gastroesophageal reflux disease)    History of chicken pox    Hyperlipidemia    Hypertension    Hypertension associated with diabetes (HCC)    Stout disorder (HCC)    Overweight (BMI 25.0-29.9) 04/03/2020   Personal history of chemotherapy 2000   Rt. Breast   Personal history of radiation therapy 2000   Rt. breast   Type 2 diabetes mellitus with complication, without long-term current use of insulin (HCC)    URI, acute 12/08/2020   UTI (urinary tract infection)    UTI (urinary tract infection)    e coli 12/2020     Past Surgical History:  Procedure Laterality Date   BREAST EXCISIONAL BIOPSY Right 2000   +   BREAST SURGERY     2000   CATARACT EXTRACTION W/PHACO Left 10/20/2022   Procedure: CATARACT EXTRACTION PHACO AND INTRAOCULAR LENS PLACEMENT (IOC) LEFT DIABETIC CLAREON VIVITY LENS;  Surgeon: Lockie Mola, MD;  Location: [facility];  Service: Ophthalmology;  Laterality: Left;  12.12 1:10.6   CATARACT EXTRACTION W/PHACO Right 11/03/2022   Procedure: CATARACT EXTRACTION PHACO AND INTRAOCULAR LENS PLACEMENT (IOC) RIGHT DIABETIC CLAREON TORIC 9.45 00:56.9;  Surgeon: Lockie Mola, MD;  Location: [facility];  Service: Ophthalmology;  Laterality: Right;  Diabetic   WRIST SURGERY Left     Prior to Admission medications   Medication Sig Start Date End  Date Taking? Authorizing Provider  amLODipine (NORVASC) 2.5 MG tablet Take 1 tablet (2.5 mg total) by mouth daily. 12/09/23  Yes Christy Allan, MD  aspirin EC 81 MG tablet Take 81 mg by mouth daily.   Yes [provider]  busPIRone (BUSPAR) 7.5 MG tablet Take 1 tablet (7.5 mg total) by mouth 2 (two) times daily. 12/09/23  Yes Christy Allan, MD  loratadine (CLARITIN) 10 MG tablet Take 10 mg by mouth daily.   Yes [provider]  losartan-hydrochlorothiazide (HYZAAR) 100-25 MG tablet Take 1 tablet by mouth daily. In am 12/09/23  Yes Christy Allan, MD  Magnesium 250 MG TABS Take by mouth daily.   Yes [provider]  meloxicam (MOBIC) 7.5 MG tablet Take 1 tablet (7.5 mg total) by mouth daily as needed for pain. 09/09/23  Yes Christy Allan, MD  metFORMIN (GLUCOPHAGE-XR) 500 MG 24 hr tablet Take 2 tablets (1,000 mg total) by mouth 2 (two) times daily with a meal. 08/13/23  Yes Christy Allan, MD  Multiple Vitamin (MULTI-VITAMIN DAILY PO) Take by mouth.   Yes [provider]  Omega-3 Fatty Acids (FISH OIL PO) Take by mouth.   Yes [provider]  rosuvastatin (CRESTOR) 10 MG tablet Take 1 tablet (10 mg total) by mouth at bedtime. 12/09/23  Yes Christy Allan, MD  Semaglutide (RYBELSUS) 14 MG TABS Take 1 tablet (14 mg total) by mouth daily. 12/09/23  Yes Christy Allan, MD  Vitamin D, Cholecalciferol, 1000 units CAPS Take 2,000 Int'l Units/day by mouth.    Yes [provider]  zolpidem (AMBIEN) 5 MG tablet Take 1 tablet (5 mg total) by mouth at bedtime as needed for sleep. 09/09/23  Yes Christy Allan, MD  Accu-Chek Softclix Lancets lancets Used to check blood sugars once per day. Softclix lancets or ask pt which she prefers 11/30/19   Stout, Christy Spillers, MD  blood glucose meter kit and supplies KIT Dispense based on patient and insurance preference. Use up to four times daily as directed. (FOR ICD-9 250.00, 250.01). 12/05/19   Stout, Christy Spillers, MD  blood glucose  meter kit and supplies Dispense based on patient and insurance preference. Bid  directed. (FOR ICD-10 E10.9, E11.9). 1 year supply strips and lancets 11/19/19   Stout, Christy Spillers, MD  Blood Glucose Monitoring Suppl (ACCU-CHEK GUIDE) w/Device KIT 1 Device by Does not apply route daily. 12/14/19   Stout, Christy Spillers, MD  glucose blood (ACCU-CHEK GUIDE) test strip Used to check blood sugar once per day E11.9. 09/03/21   Stout, Christy Spillers, MD    Allergies as of 10/24/2023 - Review Complete 10/24/2023  Allergen Reaction Noted   Lisinopril  04/03/2020    Family History  Problem Relation Age of Onset   Colon cancer Mother 71   Heart disease Father    Stroke Father    Breast cancer Sister 22   Breast cancer Maternal Grandmother    Heart disease Maternal Grandfather    Arthritis Brother    Diabetes Brother    Arthritis Brother    Alcohol abuse Son    Hypertension Son    Esophageal cancer Neg Hx    Stomach cancer Neg Hx    Liver disease Neg Hx     Social History   Socioeconomic History   Marital status: Widowed    Spouse name: Not on file   Number of children: Not on file   Years of education: Not on file   Highest education level: 12th grade  Occupational History   Not on file  Tobacco Use   Smoking status: Never   Smokeless tobacco: Never  Substance and Sexual Activity   Alcohol use: Yes    Comment: weekly   Drug use: Not Currently   Sexual activity: Yes    Partners: Male  Other Topics Concern   Not on file  Social History Narrative   Twin sons 1 daughter    Married husband [name] x 20 years he died 03/25/22       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, back clerk retired    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 also used to live in [place], from [place]   Never smoker    3 pregnancys 4 live births   No guns, wears seat belt safe in relationship       Social Drivers of Corporate investment banker Strain: Low Risk  (12/06/2023)   Overall Financial Resource Strain (CARDIA)     Difficulty of Paying Living Expenses: Not hard at all  Food Insecurity: No Food Insecurity (12/06/2023)   Hunger Vital Sign    Worried About Running Out of Food in the Last Year: Never true    Ran Out of Food in the Last Year: Never true  Transportation Needs: No Transportation Needs (12/06/2023)   PRAPARE - Administrator, Civil Service (Medical): No    Lack of Transportation (Non-Medical): No  Physical Activity: Insufficiently Active (  12/06/2023)   Exercise Vital Sign    Days of Exercise per Week: 4 days    Minutes of Exercise per Session: 30 min  Stress: No Stress Concern Present (12/06/2023)   [employer] of Occupational Health - Occupational Stress Questionnaire    Feeling of Stress : Not at all  Social Connections: Moderately Isolated (12/06/2023)   Social Connection and Isolation Panel [NHANES]    Frequency of Communication with Friends and Family: More than three times a week    Frequency of Social Gatherings with Friends and Family: Three times a week    Attends Religious Services: Never    Active Member of Clubs or Organizations: No    Attends Engineer, structural: More than 4 times per year    Marital Status: Widowed  Intimate Partner Violence: Not At Risk (02/24/2023)   Humiliation, Afraid, Rape, and Kick questionnaire    Fear of Current or Ex-Partner: No    Emotionally Abused: No    Physically Abused: No    Sexually Abused: No    Review of Systems: See HPI, otherwise negative ROS  Physical Exam: BP (!) 152/78   Temp 99.3 F (37.4 C) (Temporal)   Resp 11   Ht 5' 7.52" (1.715 m)   Wt 62.2 kg   SpO2 98%   BMI 21.16 kg/m  General:   Alert,  pleasant and cooperative in NAD Head:  Normocephalic and atraumatic. Neck:  Supple; no masses or thyromegaly. Lungs:  Clear throughout to auscultation, normal respiratory effort.    Heart:  +S1, +S2, Regular rate and rhythm, No edema. Abdomen:  Soft, nontender and nondistended. Normal bowel sounds, without  guarding, and without rebound.   Neurologic:  Alert and  oriented x4;  grossly normal neurologically.  Impression/Plan: Christy Stout is here for an colonoscopy to be performed for surveillance due to prior history of colon polyps   Risks, benefits, limitations, and alternatives regarding  colonoscopy have been reviewed with the patient.  Questions have been answered.  All parties agreeable.   Christy Mood, MD  12/23/2023, 7:33 AM

## 2023-12-23 NOTE — Anesthesia Preprocedure Evaluation (Signed)
 Anesthesia Evaluation  Patient identified by MRN, date of birth, ID band Patient awake    Reviewed: Allergy & Precautions, NPO status , Patient's Chart, lab work & pertinent test results  History of Anesthesia Complications Negative for: history of anesthetic complications  Airway Mallampati: III  TM Distance: <3 FB Neck ROM: full    Dental no notable dental hx. (+) Teeth Intact   Pulmonary neg pulmonary ROS, neg shortness of breath, neg sleep apnea, neg COPD, neg recent URI, Patient abstained from smoking.Not current smoker   Pulmonary exam normal breath sounds clear to auscultation       Cardiovascular Exercise Tolerance: Good METShypertension, (-) angina + CAD  (-) Past MI and (-) Cardiac Stents Normal cardiovascular exam(-) dysrhythmias (-) Valvular Problems/Murmurs Rhythm:Regular Rate:Normal - Systolic murmurs    Neuro/Psych negative neurological ROS  negative psych ROS   GI/Hepatic Neg liver ROS,GERD  Controlled,,  Endo/Other  diabetes, Type 2    Renal/GU negative Renal ROS     Musculoskeletal   Abdominal   Peds  Hematology negative hematology ROS (+)   Anesthesia Other Findings Past Medical History: No date: Arthritis No date: Bacterial vaginosis 2000: Breast cancer, right (HCC)     Comment:   lumpectomy 8 rounds chemo, 6 weeks radiation mammo               02/24/17 negative on Arimedex x 7 years  No date: Cataract No date: COVID-19     Comment:  04/13/21 No date: Diabetes mellitus without complication (HCC) No date: Diverticulosis No date: GERD (gastroesophageal reflux disease) No date: History of chicken pox No date: Hyperlipidemia No date: Hypertension 2000: Personal history of chemotherapy     Comment:  Rt. Breast 2000: Personal history of radiation therapy     Comment:  Rt. breast No date: UTI (urinary tract infection) No date: UTI (urinary tract infection)     Comment:  e coli 12/2020    Past Surgical History: 2000: BREAST EXCISIONAL BIOPSY; Right     Comment:  + No date: BREAST SURGERY     Comment:  2000 No date: WRIST SURGERY; Left  BMI    Body Mass Index: 11.06 kg/m      Reproductive/Obstetrics negative OB ROS                             Anesthesia Physical Anesthesia Plan  ASA: 2  Anesthesia Plan: General   Post-op Pain Management: Minimal or no pain anticipated   Induction: Intravenous  PONV Risk Score and Plan: 3 and Propofol infusion and TIVA  Airway Management Planned: Nasal Cannula and Natural Airway  Additional Equipment:   Intra-op Plan:   Post-operative Plan:   Informed Consent: I have reviewed the patients History and Physical, chart, labs and discussed the procedure including the risks, benefits and alternatives for the proposed anesthesia with the patient or authorized representative who has indicated his/her understanding and acceptance.       Plan Discussed with: CRNA and Surgeon  Anesthesia Plan Comments: (Explained risks of anesthesia, including PONV, and rare emergencies such as cardiac events, respiratory problems, and allergic reactions, requiring invasive intervention. Discussed the role of CRNA in patient's perioperative care. Patient understands. )       Anesthesia Quick Evaluation

## 2023-12-27 LAB — SURGICAL PATHOLOGY

## 2024-01-10 ENCOUNTER — Encounter: Payer: Self-pay | Admitting: Gastroenterology

## 2024-02-29 ENCOUNTER — Ambulatory Visit

## 2024-03-12 ENCOUNTER — Ambulatory Visit: Admitting: Family Medicine

## 2024-03-15 ENCOUNTER — Ambulatory Visit (INDEPENDENT_AMBULATORY_CARE_PROVIDER_SITE_OTHER): Admitting: Family Medicine

## 2024-03-15 ENCOUNTER — Encounter: Payer: Self-pay | Admitting: Family Medicine

## 2024-03-15 ENCOUNTER — Telehealth: Payer: Self-pay

## 2024-03-15 ENCOUNTER — Ambulatory Visit: Payer: Self-pay | Admitting: Family Medicine

## 2024-03-15 ENCOUNTER — Other Ambulatory Visit (HOSPITAL_COMMUNITY): Payer: Self-pay

## 2024-03-15 VITALS — BP 134/68 | HR 77 | Temp 97.9°F | Resp 20 | Ht 67.25 in | Wt 139.5 lb

## 2024-03-15 DIAGNOSIS — G4700 Insomnia, unspecified: Secondary | ICD-10-CM

## 2024-03-15 DIAGNOSIS — M1710 Unilateral primary osteoarthritis, unspecified knee: Secondary | ICD-10-CM | POA: Diagnosis not present

## 2024-03-15 DIAGNOSIS — I152 Hypertension secondary to endocrine disorders: Secondary | ICD-10-CM | POA: Diagnosis not present

## 2024-03-15 DIAGNOSIS — E118 Type 2 diabetes mellitus with unspecified complications: Secondary | ICD-10-CM

## 2024-03-15 DIAGNOSIS — E1169 Type 2 diabetes mellitus with other specified complication: Secondary | ICD-10-CM

## 2024-03-15 DIAGNOSIS — E538 Deficiency of other specified B group vitamins: Secondary | ICD-10-CM | POA: Diagnosis not present

## 2024-03-15 DIAGNOSIS — E1159 Type 2 diabetes mellitus with other circulatory complications: Secondary | ICD-10-CM | POA: Diagnosis not present

## 2024-03-15 DIAGNOSIS — E559 Vitamin D deficiency, unspecified: Secondary | ICD-10-CM

## 2024-03-15 DIAGNOSIS — F39 Unspecified mood [affective] disorder: Secondary | ICD-10-CM | POA: Diagnosis not present

## 2024-03-15 DIAGNOSIS — E785 Hyperlipidemia, unspecified: Secondary | ICD-10-CM

## 2024-03-15 DIAGNOSIS — Z7985 Long-term (current) use of injectable non-insulin antidiabetic drugs: Secondary | ICD-10-CM | POA: Diagnosis not present

## 2024-03-15 LAB — POCT GLYCOSYLATED HEMOGLOBIN (HGB A1C): Hemoglobin A1C: 6.7 % — AB (ref 4.0–5.6)

## 2024-03-15 MED ORDER — ZOLPIDEM TARTRATE 5 MG PO TABS: 5.0000 mg | ORAL_TABLET | Freq: Every evening | ORAL | 1 refills | Status: DC | PRN

## 2024-03-15 MED ORDER — OZEMPIC (0.25 OR 0.5 MG/DOSE) 2 MG/3ML ~~LOC~~ SOPN: 0.5000 mg | PEN_INJECTOR | SUBCUTANEOUS | Status: DC

## 2024-03-15 MED ORDER — AMLODIPINE BESYLATE 2.5 MG PO TABS: 2.5000 mg | ORAL_TABLET | Freq: Every day | ORAL | 3 refills | Status: AC

## 2024-03-15 MED ORDER — MELOXICAM 7.5 MG PO TABS: 7.5000 mg | ORAL_TABLET | Freq: Every day | ORAL | 6 refills | Status: DC | PRN

## 2024-03-15 MED ORDER — LOSARTAN POTASSIUM-HCTZ 100-25 MG PO TABS
1.0000 | ORAL_TABLET | Freq: Every day | ORAL | 3 refills | Status: AC
Start: 2024-03-15 — End: ?

## 2024-03-15 MED ORDER — ROSUVASTATIN CALCIUM 10 MG PO TABS: 10.0000 mg | ORAL_TABLET | Freq: Every day | ORAL | 3 refills | Status: AC

## 2024-03-15 MED ORDER — OZEMPIC (0.25 OR 0.5 MG/DOSE) 2 MG/3ML ~~LOC~~ SOPN: 0.5000 mg | PEN_INJECTOR | SUBCUTANEOUS | 6 refills | Status: DC

## 2024-03-15 MED ORDER — METFORMIN HCL ER 500 MG PO TB24: 1000.0000 mg | ORAL_TABLET | Freq: Two times a day (BID) | ORAL | 3 refills | Status: DC

## 2024-03-15 MED ORDER — BUSPIRONE HCL 7.5 MG PO TABS: 7.5000 mg | ORAL_TABLET | Freq: Two times a day (BID) | ORAL | 3 refills | Status: AC

## 2024-03-15 MED ORDER — ACCU-CHEK SOFTCLIX LANCETS MISC: 4 refills | Status: DC

## 2024-03-15 NOTE — Progress Notes (Signed)
 Medication Samples have been provided to the patient.  Drug name: Ozempic      Strength: 0.25/0.5        Qty: 1 box  LOT: [id number]  Exp.Date: 08/03/2026  Dosing instructions: Inject 0.5 mg into the skin once a week   The patient has been instructed regarding the correct time, dose, and frequency of taking this medication, including desired effects and most common side effects.   Loetta Ringer  Detric Scalisi 1:45 PM 03/15/2024

## 2024-03-15 NOTE — Progress Notes (Signed)
 SUBJECTIVE:   Chief Complaint  Patient presents with   Diabetes    3 month follow up   HPI Presents for follow up chronic disease management  History of Present Illness  Discussed the use of AI scribe software for clinical note transcription with the patient, who gave verbal consent to proceed.  History of Present Illness Christy Stout is a 73 year old female with type 2 diabetes mellitus who presents for follow-up regarding her diabetes management.  She has been managing her type 2 diabetes with Rybelsus , which was increased to 14 mg, the maximum dose, three months ago. Her blood sugar levels at home are usually in the 130s in the morning. Previously, when she was taking four tablets of metformin , her morning sugars were in the 160-170 range. She has since reduced her metformin  to two tablets, and her morning sugars are now around 130. Her A1c has increased from 6.3 to between 6.4 and 6.7. She has been eating more carbohydrates recently, particularly in the evenings.  She prefers reducing the number of pills she takes, as she finds it cumbersome to manage multiple medications. She has prescription insurance through [employer], which has made Rybelsus  more affordable compared to her previous insurance with [employer]. She pays $45 for a 30-day supply and has two months of the 14 mg Rybelsus  left, as she recently refilled a 90-day supply.  She also takes zolpidem  and meloxicam , the latter for arthritis. She is considering seeing an orthopedic specialist for a potential knee replacement due to worsening knee issues, which are causing hip pain and limiting her ability to walk. She had knee surgery approximately 30 years ago, resulting in significant scar tissue. She is concerned about the alignment of her leg and the impact on her mobility.  No issues with nausea from her current medications and has not experienced significant weight loss.     PERTINENT PMH / PSH: As  above  OBJECTIVE:  BP 134/68   Pulse 77   Temp 97.9 F (36.6 C)   Resp 20   Ht 5' 7.25 (1.708 m)   Wt 139 lb 8 oz (63.3 kg)   SpO2 99%   BMI 21.69 kg/m    Physical Exam Vitals reviewed.  Constitutional:      General: She is not in acute distress.    Appearance: Normal appearance. She is normal weight. She is not ill-appearing, toxic-appearing or diaphoretic.   Eyes:     General:        Right eye: No discharge.        Left eye: No discharge.     Conjunctiva/sclera: Conjunctivae normal.    Cardiovascular:     Rate and Rhythm: Normal rate and regular rhythm.     Heart sounds: Normal heart sounds.  Pulmonary:     Effort: Pulmonary effort is normal.     Breath sounds: Normal breath sounds.  Abdominal:     General: Bowel sounds are normal.   Musculoskeletal:        General: Normal range of motion.   Skin:    General: Skin is warm and dry.   Neurological:     General: No focal deficit present.     Mental Status: She is alert and oriented to person, place, and time. Mental status is at baseline.   Psychiatric:        Mood and Affect: Mood normal.        Behavior: Behavior normal.  Thought Content: Thought content normal.        Judgment: Judgment normal.           03/15/2024    1:10 PM 12/09/2023   11:34 AM 09/09/2023    2:48 PM 05/24/2023   11:28 AM 02/24/2023    2:15 PM  Depression screen PHQ 2/9  Decreased Interest 0 0 0 0 0  Down, Depressed, Hopeless 0 0 0 0 0  PHQ - 2 Score 0 0 0 0 0  Altered sleeping 0 0 0 0 1  Tired, decreased energy 0 0 0 0 0  Change in appetite 0 0 0 0 0  Feeling bad or failure about yourself  0 0 0 0 0  Trouble concentrating 0 0 0 0 0  Moving slowly or fidgety/restless 0 0 0 0 0  Suicidal thoughts 0 0 0 0 0  PHQ-9 Score 0 0 0 0 1  Difficult doing work/chores Not difficult at all Not difficult at all Not difficult at all Not difficult at all Not difficult at all      03/15/2024    1:10 PM 12/09/2023   11:34 AM 09/09/2023     2:48 PM 05/24/2023   11:28 AM  GAD 7 : Generalized Anxiety Score  Nervous, Anxious, on Edge 0 0 0 0  Control/stop worrying 0 0 0 0  Worry too much - different things 0 0 0 0  Trouble relaxing 0 0 0 0  Restless 0 0 0 0  Easily annoyed or irritable 0 0 0 0  Afraid - awful might happen 0 0 0 0  Total GAD 7 Score 0 0 0 0  Anxiety Difficulty Not difficult at all Not difficult at all Not difficult at all Not difficult at all    ASSESSMENT/PLAN:  Type 2 diabetes mellitus with complications (HCC) Assessment & Plan: Increased HbA1c from 6.3% to 6.7% likely due to increased carbohydrate intake. Prefers Ozempic injections due to pill fatigue. - Avoid nighttime carbohydrates. - Start Ozempic 0.5 mg weekly injection. Sample provided and educated how to inject medication - Discontinue Rybelsus  - Increase Metformin  back to 2 gm BID.  Prescription resent - Foot exam completed - UACR today - Check Cmet - Referral sent to pharmacy for follow up in 3 months.  Orders: -     Microalbumin / creatinine urine ratio -     HM Diabetes Foot Exam -     POCT glycosylated hemoglobin (Hb A1C) -     metFORMIN  HCl ER; Take 2 tablets (1,000 mg total) by mouth 2 (two) times daily with a meal.  Dispense: 360 tablet; Refill: 3 -     Accu-Chek Softclix Lancets; Used to check blood sugars once per day. Softclix lancets or ask pt which she prefers  Dispense: 100 each; Refill: 4 -     Ozempic (0.25 or 0.5 MG/DOSE); Inject 0.5 mg into the skin once a week. -     AMB Referral [facility] Care Management -     Ozempic (0.25 or 0.5 MG/DOSE); Inject 0.5 mg into the skin once a week.  Dispense: 3 mL; Refill: 6  Hyperlipidemia associated with type 2 diabetes mellitus (HCC) Assessment & Plan: Tolerating statin - Refill Crestor  10 mg daily - Check fasting lipids  Orders: -     Rosuvastatin  Calcium ; Take 1 tablet (10 mg total) by mouth at bedtime.  Dispense: 90 tablet; Refill: 3 -     Lipid panel; Future  Insomnia,  unspecified type Assessment &  Plan: Chronic use of Zolpidem . Previously discussed risks benefits of long term use Z drugs.  Patient uses intermittently. - Refill Zolpidem  5 mg at bedtime prn x 90 tabs 1 refill - PDMP reviewed and appropriate - Will need new contract at next visit   Orders: -     Zolpidem  Tartrate; Take 1 tablet (5 mg total) by mouth at bedtime as needed for sleep.  Dispense: 90 tablet; Refill: 1  Mood disorder (HCC) Assessment & Plan: Well controlled  -Refill BuSpar  7.5 mg twice daily   Orders: -     Zolpidem  Tartrate; Take 1 tablet (5 mg total) by mouth at bedtime as needed for sleep.  Dispense: 90 tablet; Refill: 1 -     busPIRone  HCl; Take 1 tablet (7.5 mg total) by mouth 2 (two) times daily.  Dispense: 180 tablet; Refill: 3  Arthritis of knee -     Meloxicam ; Take 1 tablet (7.5 mg total) by mouth daily as needed for pain.  Dispense: 30 tablet; Refill: 6  Hypertension associated with diabetes (HCC) Assessment & Plan: Blood pressure well-controlled with current medications. - Refill Losartan -Hyzaar 100-25 mg daily - Refill Amlodipine  2.5 mg daily - Check Cmet   Orders: -     amLODIPine  Besylate; Take 1 tablet (2.5 mg total) by mouth daily.  Dispense: 90 tablet; Refill: 3 -     Losartan  Potassium-HCTZ; Take 1 tablet by mouth daily. In am  Dispense: 90 tablet; Refill: 3 -     Comprehensive metabolic panel with GFR; Future  Vitamin B 12 deficiency -     Vitamin B12; Future  Vitamin D  deficiency -     VITAMIN D  25 Hydroxy (Vit-D Deficiency, Fractures); Future    PDMP reviewed  Return in about 6 months (around 09/14/2024) for PCP.  Valli Gaw, MD

## 2024-03-15 NOTE — Assessment & Plan Note (Signed)
 Blood pressure well-controlled with current medications. - Refill Losartan -Hyzaar 100-25 mg daily - Refill Amlodipine  2.5 mg daily - Check Cmet

## 2024-03-15 NOTE — Assessment & Plan Note (Signed)
 Well controlled  -Refill BuSpar  7.5 mg twice daily

## 2024-03-15 NOTE — Assessment & Plan Note (Addendum)
 Increased HbA1c from 6.3% to 6.7% likely due to increased carbohydrate intake. Prefers Ozempic injections due to pill fatigue. - Avoid nighttime carbohydrates. - Start Ozempic 0.5 mg weekly injection. Sample provided and educated how to inject medication - Discontinue Rybelsus  - Increase Metformin  back to 2 gm BID.  Prescription resent - Foot exam completed - UACR today - Check Cmet - Referral sent to pharmacy for follow up in 3 months.

## 2024-03-15 NOTE — Telephone Encounter (Signed)
 Pharmacy Patient Advocate Encounter   Received notification from CoverMyMeds that prior authorization for Ozempic (0.25 or 0.5 MG/DOSE) 2MG /3ML pen-injectors is required/requested.   Insurance verification completed.   The patient is insured through [facility] .   Per test claim: The current 30 day co-pay is, $11.01.  No PA needed at this time. This test claim was processed through [facility]- copay amounts may vary at other pharmacies due to pharmacy/plan contracts, or as the patient moves through the different stages of their insurance plan.

## 2024-03-15 NOTE — Assessment & Plan Note (Signed)
 Chronic use of Zolpidem . Previously discussed risks benefits of long term use Z drugs.  Patient uses intermittently. - Refill Zolpidem  5 mg at bedtime prn x 90 tabs 1 refill - PDMP reviewed and appropriate - Will need new contract at next visit

## 2024-03-15 NOTE — Patient Instructions (Addendum)
 It was a pleasure meeting you today. Thank you for allowing me to take part in your health care.  Our goals for today as we discussed include:  A1c 6.7 Increase Metformin  to 2 tablets 2 times a day Stop Rybelsus  Start Ozempic 0.5 mg weekly.  Can start tomorrow.  Do not take the Rybelsus  Sample provided for Ozempic  Have reached out to pharmacy to follow up with you in 3 months for Diabetes management until you see your new Primary care doctor  [facility] [address] [place], [place], [place] [address] Phone: [phone]   This is a list of the screening recommended for you and due dates:  Health Maintenance  Topic Date Due   COVID-19 Vaccine (7 - Moderna risk 2024-25 season) 12/21/2023   Yearly kidney health urinalysis for diabetes  02/10/2024   Medicare Annual Wellness Visit  02/24/2024   Flu Shot  05/04/2024   Yearly kidney function blood test for diabetes  05/23/2024   Eye exam for diabetics  05/29/2024   Hemoglobin A1C  09/14/2024   Complete foot exam   03/15/2025   Mammogram  05/30/2025   DTaP/Tdap/Td vaccine (2 - Td or Tdap) 11/20/2025   Colon Cancer Screening  12/23/2026   Pneumococcal Vaccine for age over 13  Completed   DEXA scan (bone density measurement)  Completed   Hepatitis C Screening  Completed   Zoster (Shingles) Vaccine  Completed   HPV Vaccine  Aged Out   Meningitis B Vaccine  Aged Out      If you have any questions or concerns, please do not hesitate to call the office at [phone].  I look forward to our next visit and until then take care and stay safe.  Regards,   Valli Gaw, MD   [facility]

## 2024-03-15 NOTE — Assessment & Plan Note (Signed)
 Tolerating statin - Refill Crestor  10 mg daily - Check fasting lipids

## 2024-03-16 ENCOUNTER — Other Ambulatory Visit (INDEPENDENT_AMBULATORY_CARE_PROVIDER_SITE_OTHER)

## 2024-03-16 DIAGNOSIS — E538 Deficiency of other specified B group vitamins: Secondary | ICD-10-CM

## 2024-03-16 DIAGNOSIS — E559 Vitamin D deficiency, unspecified: Secondary | ICD-10-CM

## 2024-03-16 DIAGNOSIS — I152 Hypertension secondary to endocrine disorders: Secondary | ICD-10-CM | POA: Diagnosis not present

## 2024-03-16 DIAGNOSIS — E785 Hyperlipidemia, unspecified: Secondary | ICD-10-CM | POA: Diagnosis not present

## 2024-03-16 DIAGNOSIS — E1159 Type 2 diabetes mellitus with other circulatory complications: Secondary | ICD-10-CM | POA: Diagnosis not present

## 2024-03-16 DIAGNOSIS — E1169 Type 2 diabetes mellitus with other specified complication: Secondary | ICD-10-CM | POA: Diagnosis not present

## 2024-03-16 LAB — MICROALBUMIN / CREATININE URINE RATIO
Creatinine,U: 22.9 mg/dL
Microalb Creat Ratio: UNDETERMINED mg/g (ref 0.0–30.0)
Microalb, Ur: <0.7 mg/dL

## 2024-03-17 LAB — COMPREHENSIVE METABOLIC PANEL WITH GFR
ALT: 21 IU/L (ref 0–32)
AST: 16 IU/L (ref 0–40)
Albumin: 4.8 g/dL (ref 3.8–4.8)
Alkaline Phosphatase: 71 IU/L (ref 44–121)
BUN/Creatinine Ratio: 26 (ref 12–28)
BUN: 17 mg/dL (ref 8–27)
Bilirubin Total: 0.4 mg/dL (ref 0.0–1.2)
CO2: 24 mmol/L (ref 20–29)
Calcium: 10.1 mg/dL (ref 8.7–10.3)
Chloride: 98 mmol/L (ref 96–106)
Creatinine, Ser: 0.65 mg/dL (ref 0.57–1.00)
Globulin, Total: 2.4 g/dL (ref 1.5–4.5)
Glucose: 120 mg/dL — ABNORMAL HIGH (ref 70–99)
Potassium: 4.8 mmol/L (ref 3.5–5.2)
Sodium: 138 mmol/L (ref 134–144)
Total Protein: 7.2 g/dL (ref 6.0–8.5)
eGFR: 93 mL/min/{1.73_m2} (ref >59)

## 2024-03-17 LAB — LIPID PANEL
Chol/HDL Ratio: 2.7 ratio (ref 0.0–4.4)
Cholesterol, Total: 118 mg/dL (ref 100–199)
HDL: 43 mg/dL (ref >39)
LDL Chol Calc (NIH): 53 mg/dL (ref 0–99)
Triglycerides: 120 mg/dL (ref 0–149)
VLDL Cholesterol Cal: 22 mg/dL (ref 5–40)

## 2024-03-17 LAB — VITAMIN D 25 HYDROXY (VIT D DEFICIENCY, FRACTURES): Vit D, 25-Hydroxy: 71.2 ng/mL (ref 30.0–100.0)

## 2024-03-17 LAB — VITAMIN B12: Vitamin B-12: 1463 pg/mL — ABNORMAL HIGH (ref 232–1245)

## 2024-03-30 ENCOUNTER — Telehealth: Payer: Self-pay

## 2024-03-30 NOTE — Progress Notes (Unsigned)
 Complex Care Management Note Care Guide Note  03/30/2024 Name: Christy Stout MRN: [id number] DOB: 1951-07-21   Complex Care Management Outreach Attempts: An unsuccessful telephone outreach was attempted today to offer the patient information about available complex care management services.  Follow Up Plan:  Additional outreach attempts will be made to offer the patient complex care management information and services.   Encounter Outcome:  No Answer  Leotis Rase [facility], [facility] Guide  Direct Dial: [phone]  Fax [phone]

## 2024-04-02 NOTE — Progress Notes (Unsigned)
 Complex Care Management Note Care Guide Note  04/02/2024 Name: Christy Stout MRN: [id number] DOB: 05-03-1951   Complex Care Management Outreach Attempts: A second unsuccessful outreach was attempted today to offer the patient with information about available complex care management services.  Follow Up Plan:  Additional outreach attempts will be made to offer the patient complex care management information and services.   Encounter Outcome:  No Answer  Leotis Rase [facility], [facility] Guide  Direct Dial: [phone]  Fax [phone]

## 2024-04-03 NOTE — Progress Notes (Signed)
 Care Guide Pharmacy Note  04/03/2024 Name: Jordanne Elsbury MRN: [id number] DOB: May 04, 1951  Referred By: No primary care provider on file. Reason for referral: Complex Care Management (Unsuccessful Initial Outreach to schedule with Pharm D- Manuelita )   Lavana Huckeba is a 73 y.o. year old female who is a primary care patient of No primary care provider on file.SABRA Elida Luck was referred to the pharmacist for assistance related to: DMII  Successful contact was made with the patient to discuss pharmacy services including being ready for the pharmacist to call at least 5 minutes before the scheduled appointment time and to have medication bottles and any blood pressure readings ready for review. The patient agreed to meet with the pharmacist via telephone visit on (date/time). 04-09-24 @ 1:00 pm  Leotis Rase [facility], [facility] Guide  Direct Dial: [phone]  Fax [phone]

## 2024-04-09 ENCOUNTER — Other Ambulatory Visit (INDEPENDENT_AMBULATORY_CARE_PROVIDER_SITE_OTHER): Admitting: Pharmacist

## 2024-04-09 DIAGNOSIS — E118 Type 2 diabetes mellitus with unspecified complications: Secondary | ICD-10-CM

## 2024-04-09 NOTE — Patient Instructions (Signed)
 Ms. Christy Stout,   It was a pleasure to speak with you today! As we discussed:?   Continue Rybelsus  14 mg tablets until gone.  ~24 hours after your last dose of Rybelsus , you may start Ozempic  at the 0.5 mg once weekly dose.   While on a GLP1 medication (such as Rybelsus  and Ozempic ): Eating smaller meals, avoiding high-fat and high-sugar foods, and remaining upright after eating may reduce nausea.  Overeating is a major trigger of nausea, as often times patients will start to feel full sooner and may need to decrease portion sizes from what they were previously accustomed to.  Nausea typically resolves over time and is often a temporary side effect as your body adjusts.   If you have any questions or concerns, you may respond directly to this message, or leave me a voicemail at [phone] and I will get back to you shortly.   Thank you!   Christy Stout, PharmD Clinical Pharmacist [facility]  [phone] Future Appointments  Date Time Provider Department Center  05/08/2024  1:00 PM [facility] PHARMACIST [facility]  05/22/2024  1:40 PM [facility] ANNUAL WELLNESS VISIT [facility]  09/17/2024 11:00 AM Onesimo Claude, MD [facility]  11/23/2024  9:00 AM Abbey Bruckner, MD [facility]

## 2024-04-09 NOTE — Progress Notes (Signed)
 04/09/2024 Name: Christy Stout MRN: [id number] DOB: 07/27/1951  Subjective  Chief Complaint  Patient presents with   Diabetes    Reason for visit: ?  Christy Stout is a 73 y.o. female with a history of diabetes (type 2), who presents today for an initial diabetes pharmacotherapy visit.? Pertinent PMH also includes CAD, HTN, FLD, hx gallstones, HLD, Hx b12 deficiency.  Known DM Complications: no known complications   Care Team: Primary Care Provider: [facility] (11/2024)   Date of Last Diabetes Related Visit: with PCP on 03/15/24  Recent Summary of Change: A1c 6.7% (from 6.3%) 2/2 increased carbs in evening. Transition Rybelsus  14 mg/d to Ozempic  0.5 mg/wk. Increase metformin  back to 1000 mg BID  Medication Access/Adherence: Prescription drug coverage: Payor: MEDICARE / Plan: MEDICARE PART A AND B / Product Type: *No Product type* / .  - Reports that all medications are affordable. Rybelsus  ~$45/mo - Current Patient Assistance: None\ - Medication Adherence: Patient denies missing doses of their medication.    Since Last visit / History of Present Illness: ?  Patient reports implementing plan from last visit. Denies adverse effects with titration of metformin  back to 1000 mg BID dosing. Confirms picking up Ozempic  though has not started taking at this time. Notes preference to finish current supply of Rybelsus  first. Notes she will be out by end of this week.   General preference to reduce pill burden. Finds copious pills cumbersome. Had cut back on quantity of metformin  tablets each day, though re-increased at last visit 2/2 increase A1c.  Reported DM Regimen:  Ozemic 0.5 mg sq weekly  Metformin  1000 mg twice daily   DM medications tried in the past:?  Rybelsus  14 mg (tolerated well, transitioned to Ozempic  2025) Jardiance  and Farxiga (vaginal itching, UTI)  SMBG: Glucometer ([employer])? FBG (fasting): 133, 134 (prior to Rybelsus  170s)  Purchased own meter  at the pharmacy. Now purchases her own testing supplies which she feels is affordable. States this has been easier than dealing with Medicare.    Hypo/Hyperglycemia: ?  Symptoms of hypoglycemia since last visit:? no  Symptoms of hyperglycemia since last visit:? no  DM Prevention:  Statin: Taking; moderate intensity.?  History of chronic kidney disease? no History of albuminuria? no, last UACR on 03/15/24 = undetectable ACE/ARB - Taking losartan  100mg ; Urine MA/CR Ratio - normal.  Last eye exam: 05/30/23; No retinopathy present Last foot exam: 03/15/2024 Tobacco Use: Never smoker  Immunizations:? Flu: Up to date (Last: 06/30/2023); Pneumococcal: Up to Date PCV20 (2024; received after age 80) PPSV23 (2019; received after age 74); Shingrix: Up to date (Last: 09/05/2020, 05/14/2020)  Cardiovascular Risk Reduction History of clinical ASCVD? no The ASCVD Risk score (Arnett DK, et al., 2019) failed to calculate for the following reasons:   The valid total cholesterol range is 130 to 320 mg/dL History of heart failure? no  History of hyperlipidemia? yes Current BMI: 21.8 kg/m2 (Ht 67.25 in, Wt 63.3 kg) Taking statin? yes; moderate intensity (rosuvastatin  10mg ) Taking aspirin? not indicated; Not taking   Taking SGLT-2i? no Taking GLP- 1 RA? yes      _______________________________________________  Objective    Review of Systems:? Limited in the setting of virtual visit GI:? No nausea, vomiting, constipation, diarrhea, abdominal pain, dyspepsia, change in bowel habits  Endocrine:? No polyuria, polyphagia or blurred vision    Vitals:  Wt Readings from Last 3 Encounters:  03/15/24 139 lb 8 oz (63.3 kg)  12/23/23 137 lb 3.2 oz (62.2 kg)  12/09/23 140 lb 4 oz (63.6 kg)   BP Readings from Last 3 Encounters:  03/15/24 134/68  12/23/23 94/66  12/09/23 120/68   Pulse Readings from Last 3 Encounters:  03/15/24 77  12/23/23 65  12/09/23 79     Labs:?  Lab Results  Component Value Date    HGBA1C 6.7 (A) 03/15/2024   HGBA1C 6.3 (A) 12/09/2023   HGBA1C 6.2 (A) 09/09/2023   GLUCOSE 120 (H) 03/16/2024   MICRALBCREAT Unable to calculate 03/15/2024   MICRALBCREAT 7 09/02/2022   MICRALBCREAT 8 08/07/2020   CREATININE 0.65 03/16/2024   CREATININE 0.55 05/24/2023   CREATININE 0.57 02/10/2023   GFR 91.63 05/24/2023   GFR 91.02 02/10/2023   GFR 87.50 09/02/2022    Lab Results  Component Value Date   CHOL 118 03/16/2024   LDLCALC 53 03/16/2024   LDLCALC 34 02/10/2023   LDLCALC 44 09/02/2022   LDLDIRECT 52.0 02/08/2020   HDL 43 03/16/2024   TRIG 120 03/16/2024   TRIG 157.0 (H) 02/10/2023   TRIG 133.0 09/02/2022   ALT 21 03/16/2024   ALT 20 02/10/2023   AST 16 03/16/2024   AST 18 02/10/2023      Chemistry      Component Value Date/Time   NA 138 03/16/2024 0959   K 4.8 03/16/2024 0959   CL 98 03/16/2024 0959   CO2 24 03/16/2024 0959   BUN 17 03/16/2024 0959   CREATININE 0.65 03/16/2024 0959   CREATININE 0.66 10/19/2018 0859      Component Value Date/Time   CALCIUM  10.1 03/16/2024 0959   ALKPHOS 71 03/16/2024 0959   AST 16 03/16/2024 0959   ALT 21 03/16/2024 0959   BILITOT 0.4 03/16/2024 0959     The ASCVD Risk score (Arnett DK, et al., 2019) failed to calculate for the following reasons:   The valid total cholesterol range is 130 to 320 mg/dL  Assessment and Plan:   1. Diabetes, type 2: controlled per last A1c of 6.7% (03/15/24), though increased from previous 6.3% with goal <7% without hypoglycemia. Increased metformin  to goal dose as instructed. Has continued Rybelsus , wanted to finish her supply. Due to run out by end of week then will start Ozempic  as instructed by PCP at 0.5 mg dose.  Glucometer data shows FBG low-mid 130s mg/dL. Current Regimen: Rybelsus  14 mg/d, metformin  XR 1000 mg BID 24 hours after last Rybelsus  dose, start Ozempic  0.5 mg sq once weekly Continue metformin  XR 1000 mg BID  Future Consideration: GLP1-RA: Titrate as tolerated if  additional BG control is needed (patient goal to reduce/stop metformin ) - monitor weight. Goal = weight maintenance. 0.5 mg may be a reasonable maintenance dose for her.  SGLT2i: Avoid. Hx GU infections on both Farxiga and Jardiance .  Glucometer: Content purchasing her own supplies via [employer]. Medicare should cover supplies for her if desired. Patient verbalizes understanding.    2. HTN: uncontrolled based on last clinic BP of 134/68 mmHg (03/15/24), though historically consistently below goal <130/80 mmHg. Denies lightheadedness, dizziness, SOB, CP, vision changes.  Current Regimen: amlodipine  2.5 mg/d, losartan /hctz 100-25 mg/d Continue medications without changes.    3. ASCVD (primary prevention): controlled on last lipid panel with LDL 53 mg/dL, TG 879 mg/dL (3/86/74). LDL goal <70 mg/dL (primary prevention, diabetes).  Key risk factors include: diabetes (well-controlled), hypertension (well-controlled), hyperlipidemia (well-controlled), and sedentary lifestyle Current Regimen: rosuvastatin  10 mg daily Continue medications today without changes.    4. Healthcare Maintenance:  Pneumococcal - Current status: Series complete. Received  PCV20 after age 79.   Shingles - Current status: Series complete Influenza - Current status: Up to date for 2024  Due to receive the following vaccines: N/A   Follow Up Follow up with clinical pharmacist via phone in ~4 weeks Patient given direct line for questions/concerns  Future Appointments  Date Time Provider Department Center  05/08/2024  1:00 PM [facility] PHARMACIST [facility]  05/22/2024  1:40 PM [facility] ANNUAL WELLNESS VISIT [facility]  09/17/2024 11:00 AM Onesimo Claude, MD [facility]  11/23/2024  9:00 AM Abbey Bruckner, MD [facility]    Manuelita FABIENE Kobs, PharmD Clinical Pharmacist [facility] [phone]

## 2024-04-16 ENCOUNTER — Encounter: Payer: Self-pay | Admitting: Family Medicine

## 2024-04-17 ENCOUNTER — Other Ambulatory Visit: Payer: Self-pay

## 2024-04-17 DIAGNOSIS — Z1231 Encounter for screening mammogram for malignant neoplasm of breast: Secondary | ICD-10-CM

## 2024-04-20 DIAGNOSIS — D225 Melanocytic nevi of trunk: Secondary | ICD-10-CM | POA: Diagnosis not present

## 2024-04-20 DIAGNOSIS — D2271 Melanocytic nevi of right lower limb, including hip: Secondary | ICD-10-CM | POA: Diagnosis not present

## 2024-04-20 DIAGNOSIS — D2261 Melanocytic nevi of right upper limb, including shoulder: Secondary | ICD-10-CM | POA: Diagnosis not present

## 2024-04-20 DIAGNOSIS — D2272 Melanocytic nevi of left lower limb, including hip: Secondary | ICD-10-CM | POA: Diagnosis not present

## 2024-04-20 DIAGNOSIS — L814 Other melanin hyperpigmentation: Secondary | ICD-10-CM | POA: Diagnosis not present

## 2024-04-20 DIAGNOSIS — D2262 Melanocytic nevi of left upper limb, including shoulder: Secondary | ICD-10-CM | POA: Diagnosis not present

## 2024-05-08 ENCOUNTER — Other Ambulatory Visit: Admitting: Pharmacist

## 2024-05-08 DIAGNOSIS — E118 Type 2 diabetes mellitus with unspecified complications: Secondary | ICD-10-CM

## 2024-05-08 NOTE — Progress Notes (Unsigned)
 05/08/2024 Name: Christy Stout MRN: [id number] DOB: 1951/02/20  Subjective  Chief Complaint  Patient presents with   Diabetes    Reason for visit: ?  Christy Stout is a 73 y.o. female with a history of diabetes (type 2), who presents today for a follow up diabetes pharmacotherapy visit.? Pertinent PMH also includes CAD, HTN, FLD, hx gallstones, HLD, Hx b12 deficiency.  Known DM Complications: no known complications   Care Team: Primary Care Provider: [facility] ? TOC Bair (11/2024) Doc of the Day: [place]   Date of Last Diabetes Related Visit: with PCP on 03/15/24; 7/7 with PharmD Recent Summary of Change: 7/7: Transition from Rybelsus  14mg  ? Ozempic  0.5 mg 6/12: A1c 6.7% (from 6.3%) 2/2 increased carbs in evening. Transition Rybelsus  14 mg/d to Ozempic  0.5 mg/wk. Increase metformin  back to 1000 mg BID  Medication Access/Adherence: Prescription drug coverage: Payor: MEDICARE / Plan: MEDICARE PART A AND B / Product Type: *No Product type* / .  - Reports that all medications are affordable. Ozempic  ~$40 for first fill, then $0 for second refill.  - Current Patient Assistance: None - Medication Adherence: Patient denies missing doses of their medication.    Since Last visit / History of Present Illness: ?  Patient reports implementing plan from last visit. Continues to do well on max dose metformin  though states her goal is to be able to come off of metformin  this year.   Confirms starting Ozempic  at 0.5 mg dose. No adverse effects or other concerns at this time. Has completed ~6 weeks of Ozempic  (sample given in clinic + refilled at pharmacy). Just refilled Rx for additional month a few days ago and has 6 weeks left of 0.5 mg dose.    Reported DM Regimen:  Ozemic 0.5 mg sq weekly  Metformin  1000 mg twice daily   DM medications tried in the past:?  Rybelsus  14 mg (tolerated well, transitioned to Ozempic  2025) Jardiance  and Farxiga (vaginal itching, UTI)  SMBG: Glucometer  ([employer])? FBG: 130s most days  Highest 145, lowest 127  Purchased own meter at the pharmacy. Now purchases her own testing supplies which she feels is affordable. States this has been easier than dealing with Medicare.    Hypo/Hyperglycemia: ?  Symptoms of hypoglycemia since last visit:? no  Symptoms of hyperglycemia since last visit:? no  DM Prevention:  Statin: Taking; moderate intensity.?  History of chronic kidney disease? no History of albuminuria? no, last UACR on 03/15/24 = undetectable ACE/ARB - Taking losartan  100mg ; Urine MA/CR Ratio - normal.  Last eye exam: 05/30/23; No retinopathy present Last foot exam: 03/15/2024 Tobacco Use: Never smoker  Immunizations:? Flu: Up to date (Last: 06/30/2023); Pneumococcal: Up to Date PCV20 (2024; received after age 82) PPSV23 (2019; received after age 66); Shingrix: Up to date (Last: 09/05/2020, 05/14/2020)  Cardiovascular Risk Reduction History of clinical ASCVD? no The ASCVD Risk score (Arnett DK, et al., 2019) failed to calculate for the following reasons:   The valid total cholesterol range is 130 to 320 mg/dL History of heart failure? no  History of hyperlipidemia? yes Current BMI: 21.8 kg/m2 (Ht 67.25 in, Wt 63.3 kg) Taking statin? yes; moderate intensity (rosuvastatin  10mg ) Taking aspirin? not indicated; Not taking   Taking SGLT-2i? no Taking GLP- 1 RA? yes      _______________________________________________  Objective    Review of Systems:? Limited in the setting of virtual visit GI:? No nausea, vomiting, constipation, diarrhea, abdominal pain, dyspepsia, change in bowel habits  Endocrine:? No polyuria, polyphagia or  blurred vision    Vitals:  Wt Readings from Last 3 Encounters:  03/15/24 139 lb 8 oz (63.3 kg)  12/23/23 137 lb 3.2 oz (62.2 kg)  12/09/23 140 lb 4 oz (63.6 kg)   BP Readings from Last 3 Encounters:  03/15/24 134/68  12/23/23 94/66  12/09/23 120/68   Pulse Readings from Last 3 Encounters:   03/15/24 77  12/23/23 65  12/09/23 79     Labs:?  Lab Results  Component Value Date   HGBA1C 6.7 (A) 03/15/2024   HGBA1C 6.3 (A) 12/09/2023   HGBA1C 6.2 (A) 09/09/2023   GLUCOSE 120 (H) 03/16/2024   MICRALBCREAT Unable to calculate 03/15/2024   MICRALBCREAT 7 09/02/2022   MICRALBCREAT 8 08/07/2020   CREATININE 0.65 03/16/2024   CREATININE 0.55 05/24/2023   CREATININE 0.57 02/10/2023   GFR 91.63 05/24/2023   GFR 91.02 02/10/2023   GFR 87.50 09/02/2022    Lab Results  Component Value Date   CHOL 118 03/16/2024   LDLCALC 53 03/16/2024   LDLCALC 34 02/10/2023   LDLCALC 44 09/02/2022   LDLDIRECT 52.0 02/08/2020   HDL 43 03/16/2024   TRIG 120 03/16/2024   TRIG 157.0 (H) 02/10/2023   TRIG 133.0 09/02/2022   ALT 21 03/16/2024   ALT 20 02/10/2023   AST 16 03/16/2024   AST 18 02/10/2023      Chemistry      Component Value Date/Time   NA 138 03/16/2024 0959   K 4.8 03/16/2024 0959   CL 98 03/16/2024 0959   CO2 24 03/16/2024 0959   BUN 17 03/16/2024 0959   CREATININE 0.65 03/16/2024 0959   CREATININE 0.66 10/19/2018 0859      Component Value Date/Time   CALCIUM  10.1 03/16/2024 0959   ALKPHOS 71 03/16/2024 0959   AST 16 03/16/2024 0959   ALT 21 03/16/2024 0959   BILITOT 0.4 03/16/2024 0959     The ASCVD Risk score (Arnett DK, et al., 2019) failed to calculate for the following reasons:   The valid total cholesterol range is 130 to 320 mg/dL  Assessment and Plan:   1. Diabetes, type 2: controlled per last A1c of 6.7% (03/15/24), though increased from previous 6.3% with goal <7% without hypoglycemia. Denies concerns since transition from Rybelsus  to Ozempic . Has 6 doses remaining (0.5 mg). FBG have remained consistent compared to those on Rybelsus , averaging 130s most days. No c/f lows. Is interested in titration to 1.0 mg when able. Titration reasonable depending in insurance approved fill-date.  Glucometer data shows FBG low-mid 130s mg/dL. Current Regimen:  metformin  XR 1000 mg BID, Ozempic  0.5 mg/wk BMI: 21.8 kg/m2 Continue Ozempic  0.5 mg/wk Refill pended for 1.0 mg dose  Monitor BMI/weight (goal = maintenance) Future Consideration: GLP1-RA: Titrate as tolerated if additional BG control is needed (patient goal to reduce/stop metformin ) - monitor weight. Goal = weight maintenance. 0.5 mg may be a reasonable maintenance dose for her.  SGLT2i: Avoid. Hx GU infections on both Farxiga and Jardiance .  Glucometer: Content purchasing her own supplies via [employer]. Medicare should cover supplies for her if desired. Patient verbalizes understanding.    2. Healthcare Maintenance:  Pneumococcal - Current status: Series complete. Received PCV20 after age 77.   Shingles - Current status: Series complete Influenza - Current status: Up to date for 2024  Due to receive the following vaccines: N/A   Follow Up Follow up with clinical pharmacist via phone in ~4 weeks Patient given direct line for questions/concerns  Future Appointments  Date  Time Provider Department Center  05/22/2024  1:40 PM [facility] ANNUAL WELLNESS VISIT [facility]  06/06/2024 11:20 AM [facility]  09/17/2024 11:00 AM Onesimo Claude, MD [facility]  11/23/2024  9:00 AM Bair, Luke, MD [facility]    Manuelita FABIENE Kobs, PharmD Clinical Pharmacist [facility] [phone]

## 2024-05-08 NOTE — Patient Instructions (Signed)
 Ms. Christy Stout,   It was a pleasure to speak with you today! As we discussed:?   Continue Ozempic  0.5 mg once weekly I will work on getting the next strength sent in to your [facility] pharmacy. For now continue at the 0.5 mg dose. Once we know how soon the insurance company will cover the next dose, we can discuss doubling up on the 0.5 mg doses.   You may respond directly to this message, or leave me a voicemail at [phone] and I will get back to you shortly.   If I don't hear from you, I will plan to reach out in 3-4 weeks to see how things have been going/discuss increasing your dose to the 1.0 mg.   Please reach out prior to your next scheduled appointment should you have any questions or concerns.  Thank you!   Future Appointments  Date Time Provider Department Center  05/22/2024  1:40 PM [facility] ANNUAL WELLNESS VISIT [facility]  06/06/2024 11:20 AM [facility]  09/17/2024 11:00 AM Onesimo Claude, MD [facility]  11/23/2024  9:00 AM Bair, Luke, MD [facility]    Manuelita FABIENE Kobs, PharmD Clinical Pharmacist [facility] [phone]

## 2024-05-09 MED ORDER — SEMAGLUTIDE (1 MG/DOSE) 4 MG/3ML ~~LOC~~ SOPN
1.0000 mg | PEN_INJECTOR | SUBCUTANEOUS | 2 refills | Status: DC
Start: 2024-05-09 — End: 2024-08-07

## 2024-05-22 ENCOUNTER — Ambulatory Visit (INDEPENDENT_AMBULATORY_CARE_PROVIDER_SITE_OTHER): Admitting: *Deleted

## 2024-05-22 VITALS — Ht 67.25 in | Wt 138.0 lb

## 2024-05-22 DIAGNOSIS — Z0000 Encounter for general adult medical examination without abnormal findings: Secondary | ICD-10-CM

## 2024-05-22 NOTE — Patient Instructions (Signed)
 Ms. Croak , Thank you for taking time out of your busy schedule to complete your Annual Wellness Visit with me. I enjoyed our conversation and look forward to speaking with you again next year. I, as well as your care team,  appreciate your ongoing commitment to your health goals. Please review the following plan we discussed and let me know if I can assist you in the future. Your Game plan/ To Do List    Referrals: If you haven't heard from the office you've been referred to, please reach out to them at the phone provided.  Remember to get your fu and covid vaccines as discussed  Follow up Visits: We will see or speak with you next year for your Next Medicare AWV with our clinical staff 05/27/25 @ 1:40 Have you seen your provider in the last 6 months (3 months if uncontrolled diabetes)? Yes  Clinician Recommendations:  Aim for 30 minutes of exercise or brisk walking, 6-8 glasses of water , and 5 servings of fruits and vegetables each day.       This is a list of the screenings recommended for you:  Health Maintenance  Topic Date Due   COVID-19 Vaccine (7 - Moderna risk 2024-25 season) 12/21/2023   Flu Shot  05/04/2024   Eye exam for diabetics  05/29/2024   Hemoglobin A1C  09/14/2024   Yearly kidney health urinalysis for diabetes  03/15/2025   Complete foot exam   03/15/2025   Yearly kidney function blood test for diabetes  03/16/2025   Medicare Annual Wellness Visit  05/22/2025   Mammogram  05/30/2025   DTaP/Tdap/Td vaccine (2 - Td or Tdap) 11/20/2025   Colon Cancer Screening  12/23/2026   Pneumococcal Vaccine for age over 55  Completed   DEXA scan (bone density measurement)  Completed   Hepatitis C Screening  Completed   Zoster (Shingles) Vaccine  Completed   HPV Vaccine  Aged Out   Meningitis B Vaccine  Aged Out   Pneumococcal Vaccine  Discontinued    Advanced directives: (Copy Requested) Please bring a copy of your health care power of attorney and living will to the office  to be added to your chart at your convenience. You can mail to [facility] [address] [place], [place] [address] or email to [email or web address] Advance Care Planning is important because it:  [x]  Makes sure you receive the medical care that is consistent with your values, goals, and preferences  [x]  It provides guidance to your family and loved ones and reduces their decisional burden about whether or not they are making the right decisions based on your wishes.

## 2024-05-22 NOTE — Progress Notes (Signed)
 Subjective:   Christy Stout is a 73 y.o. who presents for a Medicare Wellness preventive visit.  As a reminder, Annual Wellness Visits don't include a physical exam, and some assessments may be limited, especially if this visit is performed virtually. We may recommend an in-person follow-up visit with your provider if needed.  Visit Complete: Virtual I connected with  Christy Stout on 05/22/24 by a audio enabled telemedicine application and verified that I am speaking with the correct person using two identifiers.  Patient Location: Home  Provider Location: Home Office  I discussed the limitations of evaluation and management by telemedicine. The patient expressed understanding and agreed to proceed.  Vital Signs: Because this visit was a virtual/telehealth visit, some criteria may be missing or patient reported. Any vitals not documented were not able to be obtained and vitals that have been documented are patient reported.  VideoDeclined- This patient declined Librarian, academic. Therefore the visit was completed with audio only.  Persons Participating in Visit: Patient.  AWV Questionnaire: Yes: Patient Medicare AWV questionnaire was completed by the patient on 05/19/24; I have confirmed that all information answered by patient is correct and no changes since this date.  Cardiac Risk Factors include: advanced age (>28men, >12 women);diabetes mellitus;dyslipidemia;hypertension     Objective:    Today's Vitals   05/22/24 1340  Weight: 138 lb (62.6 kg)  Height: 5' 7.25 (1.708 m)   Body mass index is 21.45 kg/m.     05/22/2024    1:54 PM 12/23/2023    6:42 AM 02/24/2023    2:10 PM 11/03/2022    6:56 AM 10/20/2022   10:01 AM 06/02/2022   11:11 AM 02/22/2022    2:56 PM  Advanced Directives  Does Patient Have a Medical Advance Directive? Yes Yes Yes Yes Yes No Yes  Type of Estate agent of [place];Living will Living  will;Healthcare Power of [employer] Power of [employer] Power of [place];Living will Healthcare Power of [place];Living will  Healthcare Power of [place];Living will  Does patient want to make changes to medical advance directive?    No - Patient declined No - Patient declined  No - Patient declined  Copy of Healthcare Power of Attorney in Chart? No - copy requested No - copy requested No - copy requested Yes - validated most recent copy scanned in chart (See row information) Yes - validated most recent copy scanned in chart (See row information)  No - copy requested  Would patient like information on creating a medical advance directive?      No - Patient declined     Current Medications (verified) Outpatient Encounter Medications as of 05/22/2024  Medication Sig   amLODipine  (NORVASC ) 2.5 MG tablet Take 1 tablet (2.5 mg total) by mouth daily.   aspirin EC 81 MG tablet Take 81 mg by mouth daily.   Blood Glucose Monitoring Suppl (ACCU-CHEK GUIDE) w/Device KIT 1 Device by Does not apply route daily.   busPIRone  (BUSPAR ) 7.5 MG tablet Take 1 tablet (7.5 mg total) by mouth 2 (two) times daily.   loratadine (CLARITIN) 10 MG tablet Take 10 mg by mouth daily.   losartan -hydrochlorothiazide  (HYZAAR) 100-25 MG tablet Take 1 tablet by mouth daily. In am   Magnesium 250 MG TABS Take by mouth daily.   meloxicam  (MOBIC ) 7.5 MG tablet Take 1 tablet (7.5 mg total) by mouth daily as needed for pain.   metFORMIN  (GLUCOPHAGE -XR) 500 MG 24 hr tablet Take 2 tablets (1,000  mg total) by mouth 2 (two) times daily with a meal.   Multiple Vitamin (MULTI-VITAMIN DAILY PO) Take by mouth.   Omega-3 Fatty Acids (FISH OIL PO) Take by mouth.   rosuvastatin  (CRESTOR ) 10 MG tablet Take 1 tablet (10 mg total) by mouth at bedtime.   Semaglutide ,0.25 or 0.5MG /DOS, (OZEMPIC , 0.25 OR 0.5 MG/DOSE,) 2 MG/3ML SOPN Inject 0.5 mg into the skin once a week.   Vitamin D , Cholecalciferol, 1000 units CAPS Take 2,000  Int'l Units/day by mouth.    zolpidem  (AMBIEN ) 5 MG tablet Take 1 tablet (5 mg total) by mouth at bedtime as needed for sleep.   Semaglutide , 1 MG/DOSE, 4 MG/3ML SOPN Inject 1 mg as directed once a week. (Patient not taking: Reported on 05/22/2024)   Facility-Administered Encounter Medications as of 05/22/2024  Medication   0.9 %  sodium chloride  infusion    Allergies (verified) Lisinopril   History: Past Medical History:  Diagnosis Date   Arthritis    Bacterial vaginosis    Breast cancer (HCC)    Breast cancer, right (HCC) 2000    lumpectomy 8 rounds chemo, 6 weeks radiation mammo 02/24/17 negative on Arimedex x 7 years    Cataract    COVID-19    04/13/21   Diverticulosis    GERD (gastroesophageal reflux disease)    History of chicken pox    Hyperlipidemia    Hypertension    Hypertension associated with diabetes (HCC)    Mood disorder (HCC)    Overweight (BMI 25.0-29.9) 04/03/2020   Personal history of chemotherapy 2000   Rt. Breast   Personal history of radiation therapy 2000   Rt. breast   Type 2 diabetes mellitus with complication, without long-term current use of insulin (HCC)    URI, acute 12/08/2020   UTI (urinary tract infection)    UTI (urinary tract infection)    e coli 12/2020    Past Surgical History:  Procedure Laterality Date   BREAST EXCISIONAL BIOPSY Right 2000   +   BREAST SURGERY     2000   CATARACT EXTRACTION W/PHACO Left 10/20/2022   Procedure: CATARACT EXTRACTION PHACO AND INTRAOCULAR LENS PLACEMENT (IOC) LEFT DIABETIC CLAREON VIVITY LENS;  Surgeon: Mittie Gaskin, MD;  Location: [facility];  Service: Ophthalmology;  Laterality: Left;  12.12 1:10.6   CATARACT EXTRACTION W/PHACO Right 11/03/2022   Procedure: CATARACT EXTRACTION PHACO AND INTRAOCULAR LENS PLACEMENT (IOC) RIGHT DIABETIC CLAREON TORIC 9.45 00:56.9;  Surgeon: Mittie Gaskin, MD;  Location: [facility];  Service: Ophthalmology;  Laterality: Right;  Diabetic    COLONOSCOPY WITH PROPOFOL  N/A 12/23/2023   Procedure: COLONOSCOPY WITH PROPOFOL ;  Surgeon: Therisa Bi, MD;  Location: [facility];  Service: Endoscopy;  Laterality: N/A;  CLIP X1 ASCENDING COLON   POLYPECTOMY  12/23/2023   Procedure: POLYPECTOMY;  Surgeon: Therisa Bi, MD;  Location: [facility];  Service: Endoscopy;;   WRIST SURGERY Left    Family History  Problem Relation Age of Onset   Colon cancer Mother 77   Heart disease Father    Stroke Father    Breast cancer Sister 45   Breast cancer Maternal Grandmother    Heart disease Maternal Grandfather    Arthritis Brother    Diabetes Brother    Arthritis Brother    Alcohol abuse Son    Hypertension Son    Esophageal cancer Neg Hx    Stomach cancer Neg Hx    Liver disease Neg Hx    Social History  Socioeconomic History   Marital status: Widowed    Spouse name: Not on file   Number of children: Not on file   Years of education: Not on file   Highest education level: 12th grade  Occupational History   Not on file  Tobacco Use   Smoking status: Never   Smokeless tobacco: Never  Substance and Sexual Activity   Alcohol use: Yes    Comment: weekly   Drug use: Not Currently   Sexual activity: Yes    Partners: Male  Other Topics Concern   Not on file  Social History Narrative   Twin sons 1 daughter    Married husband Debby x 20 years he died 03/25/2022       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, back clerk retired    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 also used to live in [place], from [place]    Never smoker    3 pregnancys 4 live births   No guns, wears seat belt safe in relationship       Social Drivers of Corporate investment banker Strain: Low Risk  (05/19/2024)   Overall Financial Resource Strain (CARDIA)    Difficulty of Paying Living Expenses: Not hard at all  Food Insecurity: No Food Insecurity (05/19/2024)   Hunger Vital Sign    Worried About Running Out of Food in the Last Year: Never true    Ran Out of Food in  the Last Year: Never true  Transportation Needs: No Transportation Needs (05/19/2024)   PRAPARE - Administrator, Civil Service (Medical): No    Lack of Transportation (Non-Medical): No  Physical Activity: Insufficiently Active (05/19/2024)   Exercise Vital Sign    Days of Exercise per Week: 3 days    Minutes of Exercise per Session: 30 min  Stress: No Stress Concern Present (05/19/2024)   [employer] of Occupational Health - Occupational Stress Questionnaire    Feeling of Stress: Only a little  Social Connections: Socially Isolated (05/19/2024)   Social Connection and Isolation Panel    Frequency of Communication with Friends and Family: More than three times a week    Frequency of Social Gatherings with Friends and Family: Three times a week    Attends Religious Services: Never    Active Member of Clubs or Organizations: No    Attends Banker Meetings: Never    Marital Status: Widowed    Tobacco Counseling Counseling given: Not Answered    Clinical Intake:  Pre-visit preparation completed: Yes  Pain : No/denies pain     BMI - recorded: 21.45 Nutritional Status: BMI of 19-24  Normal Nutritional Risks: None Diabetes: Yes CBG done?: Yes (FBS 134 per patient)  Lab Results  Component Value Date   HGBA1C 6.7 (A) 03/15/2024   HGBA1C 6.3 (A) 12/09/2023   HGBA1C 6.2 (A) 09/09/2023     How often do you need to have someone help you when you read instructions, pamphlets, or other written materials from your doctor or pharmacy?: 1 - Never  Interpreter Needed?: No  Information entered by :: R. Azarria Balint LPN   Activities of Daily Living     05/22/2024    1:41 PM 12/23/2023    6:41 AM  In your present state of health, do you have any difficulty performing the following activities:  Hearing? 0 0  Vision? 0 0  Difficulty concentrating or making decisions? 0 0  Walking or climbing stairs? 0   Dressing or bathing? 0  Doing errands, shopping? 0    Preparing Food and eating ? N   Using the Toilet? N   In the past six months, have you accidently leaked urine? N   Do you have problems with loss of bowel control? N   Managing your Medications? N   Managing your Finances? N   Housekeeping or managing your Housekeeping? N     Patient Care Team: Therisa Bi, MD as Consulting Physician (Gastroenterology)  I have updated your Care Teams any recent Medical Services you may have received from other providers in the past year.     Assessment:   This is a routine wellness examination for [place].  Hearing/Vision screen Hearing Screening - Comments:: No issues Vision Screening - Comments:: readers   Goals Addressed             This Visit's Progress    Patient Stated       Wants to get her A1c under control so that she can cut back on some of her medications       Depression Screen     05/22/2024    1:48 PM 03/15/2024    1:10 PM 12/09/2023   11:34 AM 09/09/2023    2:48 PM 05/24/2023   11:28 AM 02/24/2023    2:15 PM 01/04/2023    1:05 PM  PHQ 2/9 Scores  PHQ - 2 Score 0 0 0 0 0 0 0  PHQ- 9 Score 0 0 0 0 0 1     Fall Risk     05/22/2024    1:44 PM 03/15/2024    1:10 PM 12/09/2023   11:34 AM 09/09/2023    2:48 PM 05/24/2023   11:28 AM  Fall Risk   Falls in the past year? 0 0 0 0 0  Number falls in past yr: 0 0 0 0 0  Injury with Fall? 0 0 0 0 0  Risk for fall due to : No Fall Risks No Fall Risks No Fall Risks No Fall Risks No Fall Risks  Follow up Falls evaluation completed;Falls prevention discussed Falls evaluation completed;Education provided Falls evaluation completed  Falls evaluation completed    MEDICARE RISK AT HOME:  Medicare Risk at Home Any stairs in or around the home?: No If so, are there any without handrails?: No Home free of loose throw rugs in walkways, pet beds, electrical cords, etc?: Yes Adequate lighting in your home to reduce risk of falls?: Yes Life alert?: No Use of a cane, walker or w/c?:  No Grab bars in the bathroom?: No Shower chair or bench in shower?: Yes Elevated toilet seat or a handicapped toilet?: Yes  TIMED UP AND GO:  Was the test performed?  No  Cognitive Function: 6CIT completed        05/22/2024    1:55 PM 02/24/2023    2:12 PM 04/21/2020   12:49 PM  6CIT Screen  What Year? 0 points 0 points 0 points  What month? 0 points 0 points 0 points  What time? 0 points 0 points 0 points  Count back from 20 0 points 0 points 0 points  Months in reverse 0 points 0 points 0 points  Repeat phrase 0 points 0 points 0 points  Total Score 0 points 0 points 0 points    Immunizations Immunization History  Administered Date(s) Administered   Fluad Quad(high Dose 65+) 06/15/2019, 06/23/2020, 06/24/2021, 07/05/2022   Fluad Trivalent(High Dose 65+) 06/30/2023   Influenza, High Dose Seasonal PF 06/13/2018  Moderna Covid-19 Fall Seasonal Vaccine 62yrs & older 06/19/2022, 06/23/2023   Moderna Covid-19 Vaccine  Bivalent Booster 98yrs & up 07/10/2021   Moderna SARS-COV2 Booster Vaccination 01/02/2021   Moderna Sars-Covid-2 Vaccination 11/03/2019, 12/03/2019, 07/25/2020   PNEUMOCOCCAL CONJUGATE-20 01/04/2023   Pneumococcal Polysaccharide-23 03/16/2018   Respiratory Syncytial Virus Vaccine,Recomb Aduvanted(Arexvy) 08/31/2022   Tdap 11/21/2015   Zoster Recombinant(Shingrix) 05/14/2020, 09/05/2020    Screening Tests Health Maintenance  Topic Date Due   COVID-19 Vaccine (7 - Moderna risk 2024-25 season) 12/21/2023   Medicare Annual Wellness (AWV)  02/24/2024   INFLUENZA VACCINE  05/04/2024   OPHTHALMOLOGY EXAM  05/29/2024   HEMOGLOBIN A1C  09/14/2024   Diabetic kidney evaluation - Urine ACR  03/15/2025   FOOT EXAM  03/15/2025   Diabetic kidney evaluation - eGFR measurement  03/16/2025   MAMMOGRAM  05/30/2025   DTaP/Tdap/Td (2 - Td or Tdap) 11/20/2025   Colonoscopy  12/23/2026   Pneumococcal Vaccine: 50+ Years  Completed   DEXA SCAN  Completed   Hepatitis C  Screening  Completed   Zoster Vaccines- Shingrix  Completed   HPV VACCINES  Aged Out   Meningococcal B Vaccine  Aged Out   Pneumococcal Vaccine  Discontinued    Health Maintenance  Health Maintenance Due  Topic Date Due   COVID-19 Vaccine (7 - Moderna risk 2024-25 season) 12/21/2023   Medicare Annual Wellness (AWV)  02/24/2024   INFLUENZA VACCINE  05/04/2024   Health Maintenance Items Addressed: Discussed the need to update flu and covid vaccines annually  Additional Screening:  Vision Screening: Recommended annual ophthalmology exams for early detection of glaucoma and other disorders of the eye. Up to date  Eye Would you like a referral to an eye doctor? No    Dental Screening: Recommended annual dental exams for proper oral hygiene  Community Resource Referral / Chronic Care Management: CRR required this visit?  No   CCM required this visit?  No   Plan:    I have personally reviewed and noted the following in the patient's chart:   Medical and social history Use of alcohol, tobacco or illicit drugs  Current medications and supplements including opioid prescriptions. Patient is not currently taking opioid prescriptions. Functional ability and status Nutritional status Physical activity Advanced directives List of other physicians Hospitalizations, surgeries, and ER visits in previous 12 months Vitals Screenings to include cognitive, depression, and falls Referrals and appointments  In addition, I have reviewed and discussed with patient certain preventive protocols, quality metrics, and best practice recommendations. A written personalized care plan for preventive services as well as general preventive health recommendations were provided to patient.   Angeline Fredericks, LPN   1/80/7974   After Visit Summary: (MyChart) Due to this being a telephonic visit, the after visit summary with patients personalized plan was offered to patient via MyChart   Notes:  Nothing significant to report at this time.

## 2024-05-29 ENCOUNTER — Other Ambulatory Visit

## 2024-06-05 ENCOUNTER — Other Ambulatory Visit (INDEPENDENT_AMBULATORY_CARE_PROVIDER_SITE_OTHER): Admitting: Pharmacist

## 2024-06-05 DIAGNOSIS — E118 Type 2 diabetes mellitus with unspecified complications: Secondary | ICD-10-CM

## 2024-06-05 NOTE — Progress Notes (Signed)
 06/05/2024 Name: Christy Stout MRN: [id number] DOB: 01/08/1951  Subjective  Chief Complaint  Patient presents with   Diabetes    Reason for visit: ?  Song Myre is a 73 y.o. female with a history of diabetes (type 2), who presents today for a follow up diabetes pharmacotherapy visit.? Pertinent PMH also includes CAD, HTN, FLD, hx gallstones, HLD, Hx b12 deficiency.  Known DM Complications: no known complications   Care Team: Primary Care Provider: [facility] ? TOC [facility] (11/2024) Doc of the Day: [place]   Date of Last Diabetes Related Visit: with PCP on 03/15/24; 7/7 with PharmD Recent Summary of Change: 8/5: FBG on avg 130s-140s. Ozempic  1.0 mg ordered, too soon to refill. Continue 0.5 mg/wk until refillable. 7/7: Transition from Rybelsus  14mg  ? Ozempic  0.5 mg 6/12: A1c 6.7% (from 6.3%) 2/2 increased carbs in evening. Transition Rybelsus  14 mg/d to Ozempic  0.5 mg/wk. Increase metformin  back to 1000 mg BID  Medication Access/Adherence: Prescription drug coverage: Payor: MEDICARE / Plan: MEDICARE PART A AND B / Product Type: *No Product type* / .  - Reports that all medications are affordable. Ozempic  ~$40 for first fill, then $0 for second refill.  - Current Patient Assistance: None - Medication Adherence: Patient denies missing doses of their medication.  Since Last visit / History of Present Illness: ?  Patient reports implementing plan from last visit. Continues to do well on max dose metformin  though states her goal is to be able to come off of metformin  this year.   Confirms good adherence with Ozempic  at 0.5 mg dose. Recently was able to pick up her first Ozempic  1.0 mg pen. Has one unopened 0.5 mg pen remaining. Has not yet increased her dose.    Traveling out of state later this month and interested in getting flu vaccine prior to her trip. Also curious about new Covid vaccine.   Reported DM Regimen:  Ozemic 0.5 mg sq weekly Metformin  1000 mg twice daily   DM  medications tried in the past:?  Rybelsus  14 mg (tolerated well, transitioned to Ozempic  2025 per greater efficacy) Jardiance  and Farxiga (vaginal itching, UTI)  SMBG: Glucometer (amazon/[employer])? FBG: 120s on average Highest 135, lowest 125  Purchased own meter at the pharmacy. Now purchases her own testing supplies which she feels is affordable. States this has been easier than dealing with Medicare.    Hypo/Hyperglycemia: ?  Symptoms of hypoglycemia since last visit:? no  Symptoms of hyperglycemia since last visit:? no  DM Prevention:  Statin: Taking; moderate intensity.?  History of chronic kidney disease? no History of albuminuria? no, last UACR on 03/15/24 = undetectable ACE/ARB - Taking losartan  100mg ; Urine MA/CR Ratio - normal.  Last eye exam: 05/30/23; No retinopathy present Last foot exam: 03/15/2024 Tobacco Use: Never smoker  Immunizations:? Flu: Up to date (Last: 06/30/2023); Pneumococcal: Up to Date PCV20 (2024; received after age 31) PPSV23 (2019; received after age 23); Shingrix: Up to date (Last: 09/05/2020, 05/14/2020)  Cardiovascular Risk Reduction History of clinical ASCVD? no The ASCVD Risk score (Arnett DK, et al., 2019) failed to calculate for the following reasons:   The systolic blood pressure is missing   The valid total cholesterol range is 130 to 320 mg/dL History of heart failure? no  History of hyperlipidemia? yes Current BMI: 21.8 kg/m2 (Ht 67.25 in, Wt 63.3 kg) Taking statin? yes; moderate intensity (rosuvastatin  10mg ) Taking aspirin? not indicated; Not taking   Taking SGLT-2i? no Taking GLP- 1 RA? yes  _______________________________________________  Objective    Review of Systems:? Limited in the setting of virtual visit GI:? No nausea, vomiting, constipation, diarrhea, abdominal pain, dyspepsia, change in bowel habits  Endocrine:? No polyuria, polyphagia or blurred vision    Vitals:  Wt Readings from Last 3 Encounters:  05/22/24 138  lb (62.6 kg)  03/15/24 139 lb 8 oz (63.3 kg)  12/23/23 137 lb 3.2 oz (62.2 kg)   BP Readings from Last 3 Encounters:  03/15/24 134/68  12/23/23 94/66  12/09/23 120/68   Pulse Readings from Last 3 Encounters:  03/15/24 77  12/23/23 65  12/09/23 79     Labs:?  Lab Results  Component Value Date   HGBA1C 6.7 (A) 03/15/2024   HGBA1C 6.3 (A) 12/09/2023   HGBA1C 6.2 (A) 09/09/2023   GLUCOSE 120 (H) 03/16/2024   MICRALBCREAT Unable to calculate 03/15/2024   MICRALBCREAT 7 09/02/2022   MICRALBCREAT 8 08/07/2020   CREATININE 0.65 03/16/2024   CREATININE 0.55 05/24/2023   CREATININE 0.57 02/10/2023   GFR 91.63 05/24/2023   GFR 91.02 02/10/2023   GFR 87.50 09/02/2022    Lab Results  Component Value Date   CHOL 118 03/16/2024   LDLCALC 53 03/16/2024   LDLCALC 34 02/10/2023   LDLCALC 44 09/02/2022   LDLDIRECT 52.0 02/08/2020   HDL 43 03/16/2024   TRIG 120 03/16/2024   TRIG 157.0 (H) 02/10/2023   TRIG 133.0 09/02/2022   ALT 21 03/16/2024   ALT 20 02/10/2023   AST 16 03/16/2024   AST 18 02/10/2023      Chemistry      Component Value Date/Time   NA 138 03/16/2024 0959   K 4.8 03/16/2024 0959   CL 98 03/16/2024 0959   CO2 24 03/16/2024 0959   BUN 17 03/16/2024 0959   CREATININE 0.65 03/16/2024 0959   CREATININE 0.66 10/19/2018 0859      Component Value Date/Time   CALCIUM  10.1 03/16/2024 0959   ALKPHOS 71 03/16/2024 0959   AST 16 03/16/2024 0959   ALT 21 03/16/2024 0959   BILITOT 0.4 03/16/2024 0959     The ASCVD Risk score (Arnett DK, et al., 2019) failed to calculate for the following reasons:   The systolic blood pressure is missing   The valid total cholesterol range is 130 to 320 mg/dL  Assessment and Plan:   1. Diabetes, type 2: controlled per last A1c of 6.7% (03/15/24), though increased from previous 6.3% with goal <7% without hypoglycemia. Continues to do well since transition from Rybelsus  to Ozempic . FBG remain slightly above goal at times, though  improved ~10 mg/dL on average from last month. No c/f lows. Received first 1.0 mg Rx a few weeks ago and plans to increase on next injection.   Glucometer data shows FBG low-mid 130s mg/dL. Current Regimen: metformin  XR 1000 mg BID, Ozempic  0.5 mg/wk BMI: 21.8 kg/m2 Increase Ozempic  1.0 mg/wk (already prescribed) Monitor BMI/weight (goal = maintenance) Continue FBG monitoring at least a few days per week.  Advised getting influenza vaccine at local pharmacy Inquire about availability of 2025-2026 Covid vaccine while at her local pharmacy.  [facility] pharmacy projected to receive new vaccine by ~end of Sept. PCP can write prescription if no standing order at that point.  Future Consideration: GLP1-RA: Titrate as tolerated if additional BG control is needed (patient goal to reduce/stop metformin ) - monitor weight. Goal = weight maintenance. 0.5 mg may be a reasonable maintenance dose for her.  SGLT2i: Avoid. Hx GU infections on both Farxiga  and Jardiance .  Glucometer: Content purchasing her own supplies via [employer]. Medicare should cover supplies for her if desired. Patient verbalizes understanding.    2. Healthcare Maintenance:  Pneumococcal - Current status: Series complete. Received PCV20 after age 66.   Shingles - Current status: Shingrix series complete Influenza - Current status: Up to date for 2024. Due 2025 Due to receive the following vaccines: 2025 flu, 2025 Covid 65+   Follow Up Follow up with clinical pharmacist via phone in ~4 weeks Patient given direct line for questions/concerns  Future Appointments  Date Time Provider Department Center  06/06/2024 11:20 AM [facility]  07/10/2024  1:00 PM [facility] PHARMACIST [facility] [address]  09/17/2024 11:00 AM Onesimo Claude, MD [facility] [address]  11/23/2024  9:00 AM Abbey Bruckner, MD [facility] [address]  05/27/2025  1:40 PM [facility] ANNUAL WELLNESS VISIT [facility] [address] FABIENE Geronimo,  PharmD Clinical Pharmacist [facility] [phone]

## 2024-06-05 NOTE — Patient Instructions (Signed)
 Ms. Jakeline Dave,   It was a pleasure to speak with you today! As we discussed:?   You may increase your Ozempic  to 1.0 mg once weekly You may finish up your 0.5 mg pen by injecting 0.5 mg twice on the same day for 2 weeks, then you may start using your new 1.0 mg pen thereafter  Flu vaccine  You should be able to receive your influenza vaccine at your local pharmacy. Most pharmacies have had the flu vaccines in stock for several weeks already, including the senior dose.   Covid Vaccine 2025-2026 The New Covid Vaccine may not have reached all pharmacies quite yet (for example, [facility] does not yet have them in stock but hope to get their first shipment sometime in late September).  Ask your pharmacy about their supply/when it will be available when you go to get your flu shot.  If we need to send a prescription in, this can be done easily! Just let us  know what your pharmacy suggests.    Please reach out prior to your next scheduled appointment should you have any questions or concerns.  Thank you!   Future Appointments  Date Time Provider Department Center  06/06/2024 11:20 AM [facility]  07/10/2024  1:00 PM [facility] PHARMACIST [facility] [address]  09/17/2024 11:00 AM Onesimo Claude, MD [facility] [address]  11/23/2024  9:00 AM Abbey Bruckner, MD [facility] [address]  05/27/2025  1:40 PM [facility] ANNUAL WELLNESS VISIT [facility] [address] FABIENE Geronimo, PharmD Clinical Pharmacist [facility] [phone]

## 2024-06-06 ENCOUNTER — Ambulatory Visit
Admission: RE | Admit: 2024-06-06 | Discharge: 2024-06-06 | Disposition: A | Discharge status: Home / Self Care | Source: Ambulatory Visit | Attending: Internal Medicine | Admitting: Internal Medicine

## 2024-06-06 DIAGNOSIS — Z1231 Encounter for screening mammogram for malignant neoplasm of breast: Secondary | ICD-10-CM | POA: Diagnosis not present

## 2024-06-11 DIAGNOSIS — Z23 Encounter for immunization: Secondary | ICD-10-CM | POA: Diagnosis not present

## 2024-06-25 DIAGNOSIS — Z23 Encounter for immunization: Secondary | ICD-10-CM | POA: Diagnosis not present

## 2024-07-10 ENCOUNTER — Other Ambulatory Visit

## 2024-08-07 ENCOUNTER — Other Ambulatory Visit: Payer: Self-pay | Admitting: Pharmacist

## 2024-08-07 DIAGNOSIS — E118 Type 2 diabetes mellitus with unspecified complications: Secondary | ICD-10-CM

## 2024-08-07 NOTE — Progress Notes (Signed)
 08/07/2024 Name: Christy Stout MRN: [id number] DOB: 1951/02/26  Subjective  Chief Complaint  Patient presents with   Diabetes   Reason for visit: ?  Christy Stout is a 73 y.o. female with a history of diabetes (type 2), who presents today for a follow up diabetes pharmacotherapy visit.? Pertinent PMH also includes CAD, HTN, FLD, hx gallstones, HLD, Hx b12 deficiency.  Known DM Complications: no known complications   Care Team: Primary Care Provider: [facility] ? TOC [facility] (11/2024) OV with Dr. Narendra 09/17/24 Doc of the Day: [place]   Date of Last Diabetes Related Visit: with PCP on 03/15/24; 7/7 with PharmD Recent Summary of Change: 8/5: FBG on avg 130s-140s. Ozempic  1.0 mg ordered, too soon to refill. Continue 0.5 mg/wk until refillable. 7/7: Transition from Rybelsus  14mg  ? Ozempic  0.5 mg 6/12: A1c 6.7% (from 6.3%) 2/2 increased carbs in evening. Transition Rybelsus  14 mg/d to Ozempic  0.5 mg/wk. Increase metformin  back to 1000 mg BID  Medication Access/Adherence: Prescription drug coverage: Payor: MEDICARE / Plan: MEDICARE PART A AND B / Product Type: *No Product type* / .  - Reports that all medications are affordable. Ozempic  ~$40 for first fill, then $0 for second refill.  - Current Patient Assistance: None - Medication Adherence: Patient denies missing doses of their medication.  Since Last visit / History of Present Illness: ?  Patient reports implementing plan from last visit. Continues to do well on max dose metformin  though states her goal is to be able to come off of metformin  if A1c sufficiently controlled on Ozempic  alone.   Confirms good adherence with Ozempic  at 1.0 mg dose. Denies side effects with increase ~2.5 months ago. Generally, notes feeling full faster, eating smaller portions. Has learned to eat more slowly and listen to her hunger/fullness cues. A little bit more gas but not a major concern for her.  Denies major weight loss. Home scale last week read 135  lb.    Reported DM Regimen:  Ozemic 1.0 mg sq weekly Metformin  1000 mg twice daily   DM medications tried in the past:?  Rybelsus  14 mg (tolerated well, transitioned to Ozempic  2025 per greater efficacy) Jardiance  and Farxiga (vaginal itching, UTI)  SMBG: Glucometer ([employer])? FBG: 130-141 usually. If doesn't eat snack at night, in the 120s.  Purchased own meter at the pharmacy. Now purchases her own testing supplies which she feels is affordable. States this has been easier than dealing with Medicare.    Hypo/Hyperglycemia: ?  Symptoms of hypoglycemia since last visit:? no  Symptoms of hyperglycemia since last visit:? no  DM Prevention:  Statin: Taking; moderate intensity.?  History of chronic kidney disease? no History of albuminuria? no, last UACR on 03/15/24 = undetectable ACE/ARB - Taking losartan  100mg ; Urine MA/CR Ratio - normal.  Last eye exam: 05/30/23; No retinopathy present Last foot exam: 03/15/2024 Tobacco Use: Never smoker  Immunizations:? Flu: Up to date (Last: 06/30/2023); Pneumococcal: Up to Date PCV20 (2024; received after age 29) PPSV23 (2019; received after age 68); Shingrix: Up to date (Last: 09/05/2020, 05/14/2020)  Cardiovascular Risk Reduction History of clinical ASCVD? no The ASCVD Risk score (Arnett DK, et al., 2019) failed to calculate for the following reasons:   The systolic blood pressure is missing   The valid total cholesterol range is 130 to 320 mg/dL History of heart failure? no  History of hyperlipidemia? yes Current BMI: 21.8 kg/m2 (Ht 67.25 in, Wt 63.3 kg) Taking statin? yes; moderate intensity (rosuvastatin  10mg ) Taking aspirin? not indicated; Not taking  Taking SGLT-2i? no Taking GLP- 1 RA? yes      _______________________________________________  Objective    Review of Systems:? Limited in the setting of virtual visit GI:? No nausea, vomiting, constipation, diarrhea, abdominal pain, dyspepsia, change in bowel habits   Endocrine:? No polyuria, polyphagia or blurred vision    Vitals:  Wt Readings from Last 3 Encounters:  05/22/24 138 lb (62.6 kg)  03/15/24 139 lb 8 oz (63.3 kg)  12/23/23 137 lb 3.2 oz (62.2 kg)   BP Readings from Last 3 Encounters:  03/15/24 134/68  12/23/23 94/66  12/09/23 120/68   Pulse Readings from Last 3 Encounters:  03/15/24 77  12/23/23 65  12/09/23 79    Labs:?  Lab Results  Component Value Date   HGBA1C 6.7 (A) 03/15/2024   HGBA1C 6.3 (A) 12/09/2023   HGBA1C 6.2 (A) 09/09/2023   GLUCOSE 120 (H) 03/16/2024   MICRALBCREAT Unable to calculate 03/15/2024   MICRALBCREAT 7 09/02/2022   MICRALBCREAT 8 08/07/2020   CREATININE 0.65 03/16/2024   CREATININE 0.55 05/24/2023   CREATININE 0.57 02/10/2023   GFR 91.63 05/24/2023   GFR 91.02 02/10/2023   GFR 87.50 09/02/2022    Lab Results  Component Value Date   CHOL 118 03/16/2024   LDLCALC 53 03/16/2024   LDLCALC 34 02/10/2023   LDLCALC 44 09/02/2022   LDLDIRECT 52.0 02/08/2020   HDL 43 03/16/2024   TRIG 120 03/16/2024   TRIG 157.0 (H) 02/10/2023   TRIG 133.0 09/02/2022   ALT 21 03/16/2024   ALT 20 02/10/2023   AST 16 03/16/2024   AST 18 02/10/2023      Chemistry      Component Value Date/Time   NA 138 03/16/2024 0959   K 4.8 03/16/2024 0959   CL 98 03/16/2024 0959   CO2 24 03/16/2024 0959   BUN 17 03/16/2024 0959   CREATININE 0.65 03/16/2024 0959   CREATININE 0.66 10/19/2018 0859      Component Value Date/Time   CALCIUM  10.1 03/16/2024 0959   ALKPHOS 71 03/16/2024 0959   AST 16 03/16/2024 0959   ALT 21 03/16/2024 0959   BILITOT 0.4 03/16/2024 0959     The ASCVD Risk score (Arnett DK, et al., 2019) failed to calculate for the following reasons:   The systolic blood pressure is missing   The valid total cholesterol range is 130 to 320 mg/dL  Assessment and Plan:   1. Diabetes, type 2: controlled per last A1c of 6.7% (03/15/24), though increased from previous 6.3% with goal <7% without  hypoglycemia. Since last A1c, has done well with Ozempic  titrations. Suspect improvement on repeat A1c. FBG remain above goal at times 2/2 nighttime snacking which patient has identified. No c/f lows. Glucometer data shows FBG low-mid 130s mg/dL. Current Regimen: metformin  XR 1000 mg BID, Ozempic  1.0 BMI: 21.8 kg/m2 Continue Ozempic  1.0 mg/wk - refill pended to Doc of the day  Continue to Monitor BMI/weight (goal = maintenance) Continue FBG monitoring at least a few days per week. Future Consideration: GLP1-RA: Patient interested in possible titration to 2.0 mg at follow up visit as a means of coming off of metformin . Reasonable if weight remains relatively stable at next office visit. Consider repeat A1c at December f/u.   Follow Up ~5 weeks office visit as scheduled with Dr. Onesimo F/u with PharmD ~1 month after clinic visit (~min Jan) [facility] scheduled w Dr. Abbey 11/23/24  Future Appointments  Date Time Provider Department Center  09/17/2024 11:00 AM Onesimo Claude, MD  [facility] [address]  11/23/2024  9:00 AM Abbey Bruckner, MD [facility] [address]  05/27/2025  1:40 PM [facility] ANNUAL WELLNESS VISIT [facility] [address] FABIENE Geronimo, PharmD Clinical Pharmacist [facility] [phone]

## 2024-08-10 MED ORDER — SEMAGLUTIDE (1 MG/DOSE) 4 MG/3ML ~~LOC~~ SOPN: 1.0000 mg | PEN_INJECTOR | SUBCUTANEOUS | 2 refills | Status: DC

## 2024-08-16 ENCOUNTER — Other Ambulatory Visit: Payer: Self-pay | Admitting: Internal Medicine

## 2024-09-03 DIAGNOSIS — H26491 Other secondary cataract, right eye: Secondary | ICD-10-CM | POA: Diagnosis not present

## 2024-09-03 DIAGNOSIS — Z961 Presence of intraocular lens: Secondary | ICD-10-CM | POA: Diagnosis not present

## 2024-09-03 DIAGNOSIS — E119 Type 2 diabetes mellitus without complications: Secondary | ICD-10-CM | POA: Diagnosis not present

## 2024-09-03 DIAGNOSIS — H43813 Vitreous degeneration, bilateral: Secondary | ICD-10-CM | POA: Diagnosis not present

## 2024-09-03 LAB — OPHTHALMOLOGY REPORT-SCANNED

## 2024-09-17 ENCOUNTER — Encounter: Payer: Self-pay | Admitting: Internal Medicine

## 2024-09-17 ENCOUNTER — Ambulatory Visit: Admitting: Internal Medicine

## 2024-09-17 VITALS — BP 114/66 | HR 83 | Temp 97.7°F | Ht 67.25 in | Wt 138.4 lb

## 2024-09-17 DIAGNOSIS — E118 Type 2 diabetes mellitus with unspecified complications: Secondary | ICD-10-CM | POA: Diagnosis not present

## 2024-09-17 DIAGNOSIS — E538 Deficiency of other specified B group vitamins: Secondary | ICD-10-CM

## 2024-09-17 DIAGNOSIS — G4700 Insomnia, unspecified: Secondary | ICD-10-CM | POA: Diagnosis not present

## 2024-09-17 DIAGNOSIS — E1169 Type 2 diabetes mellitus with other specified complication: Secondary | ICD-10-CM | POA: Diagnosis not present

## 2024-09-17 DIAGNOSIS — Z7985 Long-term (current) use of injectable non-insulin antidiabetic drugs: Secondary | ICD-10-CM

## 2024-09-17 DIAGNOSIS — I152 Hypertension secondary to endocrine disorders: Secondary | ICD-10-CM

## 2024-09-17 DIAGNOSIS — E1159 Type 2 diabetes mellitus with other circulatory complications: Secondary | ICD-10-CM | POA: Diagnosis not present

## 2024-09-17 DIAGNOSIS — M1710 Unilateral primary osteoarthritis, unspecified knee: Secondary | ICD-10-CM

## 2024-09-17 DIAGNOSIS — E559 Vitamin D deficiency, unspecified: Secondary | ICD-10-CM

## 2024-09-17 DIAGNOSIS — E785 Hyperlipidemia, unspecified: Secondary | ICD-10-CM | POA: Diagnosis not present

## 2024-09-17 DIAGNOSIS — E1165 Type 2 diabetes mellitus with hyperglycemia: Secondary | ICD-10-CM

## 2024-09-17 LAB — POCT GLYCOSYLATED HEMOGLOBIN (HGB A1C): Hemoglobin A1C: 6.2 % — AB (ref 4.0–5.6)

## 2024-09-17 LAB — LIPID PANEL
Cholesterol: 111 mg/dL (ref 0–200)
HDL: 48.7 mg/dL (ref >39.00)
LDL Cholesterol: 41 mg/dL (ref 0–99)
NonHDL: 61.9
Total CHOL/HDL Ratio: 2
Triglycerides: 106 mg/dL (ref 0.0–149.0)
VLDL: 21.2 mg/dL (ref 0.0–40.0)

## 2024-09-17 LAB — VITAMIN B12: Vitamin B-12: 518 pg/mL (ref 211–911)

## 2024-09-17 LAB — COMPREHENSIVE METABOLIC PANEL WITH GFR
ALT: 17 U/L (ref 0–35)
AST: 14 U/L (ref 0–37)
Albumin: 5 g/dL (ref 3.5–5.2)
Alkaline Phosphatase: 44 U/L (ref 39–117)
BUN: 14 mg/dL (ref 6–23)
CO2: 32 meq/L (ref 19–32)
Calcium: 11.1 mg/dL — ABNORMAL HIGH (ref 8.4–10.5)
Chloride: 97 meq/L (ref 96–112)
Creatinine, Ser: 0.52 mg/dL (ref 0.40–1.20)
GFR: 92.02 mL/min (ref >60.00)
Glucose, Bld: 115 mg/dL — ABNORMAL HIGH (ref 70–99)
Potassium: 4.5 meq/L (ref 3.5–5.1)
Sodium: 138 meq/L (ref 135–145)
Total Bilirubin: 0.5 mg/dL (ref 0.2–1.2)
Total Protein: 7.5 g/dL (ref 6.0–8.3)

## 2024-09-17 LAB — VITAMIN D 25 HYDROXY (VIT D DEFICIENCY, FRACTURES): VITD: 53.02 ng/mL (ref 30.00–100.00)

## 2024-09-17 MED ORDER — ZOLPIDEM TARTRATE 5 MG PO TABS: 5.0000 mg | ORAL_TABLET | Freq: Every evening | ORAL | 1 refills | Status: AC | PRN

## 2024-09-17 MED ORDER — METFORMIN HCL ER 500 MG PO TB24: 500.0000 mg | ORAL_TABLET | Freq: Two times a day (BID) | ORAL | 3 refills | Status: AC

## 2024-09-17 MED ORDER — MELOXICAM 7.5 MG PO TABS: 7.5000 mg | ORAL_TABLET | Freq: Every day | ORAL | 6 refills | Status: AC | PRN

## 2024-09-17 NOTE — Patient Instructions (Signed)
°  VISIT SUMMARY: During your visit, we reviewed your diabetes management, hypertension, knee arthritis, and sleep disturbance. Your diabetes is well-controlled, and we made some adjustments to your medications. We also discussed your occasional lightheadedness and knee pain, and planned for future evaluations. Routine blood work was ordered to monitor your overall health.  YOUR PLAN: -TYPE 2 DIABETES MELLITUS: Type 2 diabetes is a condition where your body does not use insulin properly, leading to high blood sugar levels. Your diabetes is well-controlled with your current medications, Ozempic  and metformin . We have reduced your metformin  dosage to 500 mg twice daily. Continue taking Ozempic  1 mg weekly. We will recheck your A1c levels in 3 months.  -HYPERTENSION: Hypertension is high blood pressure. Your blood pressure is well-controlled with your current medications, but you occasionally feel lightheaded due to low readings. Continue your current medications and monitor your blood pressure and symptoms. If your blood pressure remains low and symptoms persist, we may consider adjusting your medications.  -OSTEOARTHRITIS OF KNEE: Osteoarthritis is a condition that causes joint pain and stiffness. You have severe arthritis in your right knee, which is affecting your hip. You are considering knee replacement surgery but prefer to wait until after the holidays. We will discuss this further with an orthopedic surgeon after the holidays.  -INSOMNIA: Insomnia is difficulty falling or staying asleep. Your sleep is well-managed with Ambien . Continue taking Ambien  as needed for sleep.  -GENERAL HEALTH MAINTENANCE: Routine blood work is important to monitor your overall health. We have ordered tests to check your cholesterol, kidney function, liver function, vitamin B12, and vitamin D  levels.  INSTRUCTIONS: Recheck your A1c levels in 3 months. Discuss knee replacement evaluation with an orthopedic surgeon after  the holidays. Continue monitoring your blood pressure and symptoms of lightheadedness. Complete the ordered blood work to check your cholesterol, kidney function, liver function, vitamin B12, and vitamin D  levels.                      Contains text generated by [employer]bridge.                                 Contains text generated by Abridge.

## 2024-09-17 NOTE — Assessment & Plan Note (Signed)
-   Patient has chronic arthritis of her right knee and occasionally uses meloxicam  for this -She does state that she is considering following up with orthopedics for possible knee replacement -She will follow-up with her new PCP next year in February and address this at that time -Continue with meloxicam  as needed for now.  Refill placed today -No further workup at this time

## 2024-09-17 NOTE — Assessment & Plan Note (Signed)
-   Patient with a history of vitamin B12 deficiency on vitamin B12 supplementation -Last B12 level drawn earlier this year was elevated in the thousands -Her B12 supplementation was reduced to 3 times a week -Will recheck vitamin B12 levels today

## 2024-09-17 NOTE — Assessment & Plan Note (Signed)
-   This problems chronic and stable -Blood pressure is well-controlled today (114/66) -She is currently on amlodipine  2.5 mg daily, losartan /HCTZ 100/25 mg daily -Patient states that she sometimes does get lightheaded but blood pressure remains in the 110s -We will continue with current regimen for now -If she does have persistent lightheadedness would consider stopping her amlodipine  and continuing with Hyzaar -No further workup at this time

## 2024-09-17 NOTE — Assessment & Plan Note (Signed)
-   This problem is chronic and stable -Patient states that Ambien  is working well for her and that she does not have insomnia while using this medication -Will refill Ambien  5 mg at night as needed -PDMP reviewed and appropriate -No further workup at this time

## 2024-09-17 NOTE — Assessment & Plan Note (Signed)
-   This problem is chronic and stable -Patient's last vitamin D  level was within normal limits -She is currently on vitamin D  supplementation (2000 units/day) -Will recheck vitamin D  level -No further workup at this time

## 2024-09-17 NOTE — Assessment & Plan Note (Signed)
-   This problem is chronic and stable -We will continue with rosuvastatin  10 mg daily -Recheck lipid panel today -No further workup at this time

## 2024-09-17 NOTE — Progress Notes (Signed)
 Office Visit  Subjective:     Patient ID: Christy Stout, female    DOB: October 10, 1950, 73 y.o.   MRN: [id number]  Chief Complaint  Patient presents with   Medical Management of Chronic Issues    Discussed the use of AI scribe software for clinical note transcription with the patient, who gave verbal consent to proceed.  History of Present Illness Christy Stout is a 73 year old female who presents for a six-month follow-up regarding diabetes management.  Type 2 diabetes mellitus - Currently managed with Ozempic  1 mg (switched from Rybelsus ) and metformin  1000 mg twice daily - Desires reduction in metformin  dosage - Weight stable at 138 pounds with no significant weight loss since starting Ozempic   Gastrointestinal symptoms - Occasional constipation and gas since starting Ozempic  - No other gastrointestinal complaints  Hypertension and orthostatic symptoms - Managed with amlodipine  and losartan /hydrochlorothiazide  combination - Occasional morning lightheadedness or dizziness after taking antihypertensive medications, resolving quickly - Blood pressure readings generally low, around 114 mmHg or slightly lower - Monitors blood pressure when experiencing lightheadedness  Knee and hip arthralgia - Severe right knee arthritis with history of surgery 35 years ago resulting in significant scar tissue and persistent arthritis - Increasing knee discomfort impacting hip function - Uses meloxicam  as needed for pain - Considering knee replacement due to worsening symptoms  Sleep disturbance - Uses Ambien  for sleep - Sleeping well with current regimen  Vitamin supplementation - Takes vitamin B12 three times a week due to previously elevated levels - Takes vitamin D  2000 IU daily    Review of Systems  Constitutional: Negative.   HENT: Negative.    Respiratory: Negative.    Cardiovascular: Negative.   Gastrointestinal: Negative.   Musculoskeletal:  Positive for joint pain.   Neurological: Negative.   Psychiatric/Behavioral: Negative.          Objective:    BP 114/66   Pulse 83   Temp 97.7 F (36.5 C)   Ht 5' 7.25 (1.708 m)   Wt 138 lb 6.4 oz (62.8 kg)   SpO2 97%   BMI 21.52 kg/m    Physical Exam Constitutional:      Appearance: Normal appearance.  HENT:     Head: Normocephalic and atraumatic.  Cardiovascular:     Rate and Rhythm: Normal rate and regular rhythm.     Heart sounds: Normal heart sounds.  Pulmonary:     Effort: Pulmonary effort is normal.     Breath sounds: Normal breath sounds. No wheezing, rhonchi or rales.  Abdominal:     General: Bowel sounds are normal. There is no distension.     Palpations: Abdomen is soft.     Tenderness: There is no abdominal tenderness. There is no guarding or rebound.  Musculoskeletal:        General: No swelling or tenderness.     Right lower leg: No edema.     Left lower leg: No edema.  Neurological:     Mental Status: She is alert.  Psychiatric:        Mood and Affect: Mood normal.        Behavior: Behavior normal.     Results for orders placed or performed in visit on 09/17/24  POCT glycosylated hemoglobin (Hb A1C)  Result Value Ref Range   Hemoglobin A1C 6.2 (A) 4.0 - 5.6 %   HbA1c POC (<> result, manual entry)     HbA1c, POC (prediabetic range)     HbA1c, POC (controlled diabetic range)  Assessment & Plan:   Problem List Items Addressed This Visit       Cardiovascular and Mediastinum   Hypertension associated with diabetes (HCC)   - This problems chronic and stable -Blood pressure is well-controlled today (114/66) -She is currently on amlodipine  2.5 mg daily, losartan /HCTZ 100/25 mg daily -Patient states that she sometimes does get lightheaded but blood pressure remains in the 110s -We will continue with current regimen for now -If she does have persistent lightheadedness would consider stopping her amlodipine  and continuing with Hyzaar -No further workup at  this time      Relevant Medications   metFORMIN  (GLUCOPHAGE -XR) 500 MG 24 hr tablet   Other Relevant Orders   Comprehensive metabolic panel with GFR     Endocrine   Hyperlipidemia associated with type 2 diabetes mellitus (HCC)   - This problem is chronic and stable -We will continue with rosuvastatin  10 mg daily -Recheck lipid panel today -No further workup at this time      Relevant Medications   metFORMIN  (GLUCOPHAGE -XR) 500 MG 24 hr tablet   Other Relevant Orders   Lipid panel   Type 2 diabetes mellitus with complications (HCC)   - This problem is chronic stable -Patient is currently on metformin  1000 mg twice a day as well as Ozempic  1 mg weekly -A1c is at 6.2 today -She would like to try and come off some of her diabetes medication -We will reduce her metformin  to 500 mg twice a day and continue with Ozempic  1 mg weekly and have her follow-up with her PCP in February for further evaluation -Will check CMP today -No further workup at this time      Relevant Medications   metFORMIN  (GLUCOPHAGE -XR) 500 MG 24 hr tablet     Musculoskeletal and Integument   Arthritis of knee   - Patient has chronic arthritis of her right knee and occasionally uses meloxicam  for this -She does state that she is considering following up with orthopedics for possible knee replacement -She will follow-up with her new PCP next year in February and address this at that time -Continue with meloxicam  as needed for now.  Refill placed today -No further workup at this time      Relevant Medications   meloxicam  (MOBIC ) 7.5 MG tablet     Other   Insomnia   - This problem is chronic and stable -Patient states that Ambien  is working well for her and that she does not have insomnia while using this medication -Will refill Ambien  5 mg at night as needed -PDMP reviewed and appropriate -No further workup at this time      Relevant Medications   zolpidem  (AMBIEN ) 5 MG tablet   Vitamin B 12  deficiency   - Patient with a history of vitamin B12 deficiency on vitamin B12 supplementation -Last B12 level drawn earlier this year was elevated in the thousands -Her B12 supplementation was reduced to 3 times a week -Will recheck vitamin B12 levels today      Relevant Orders   Vitamin B12   Vitamin D  deficiency   - This problem is chronic and stable -Patient's last vitamin D  level was within normal limits -She is currently on vitamin D  supplementation (2000 units/day) -Will recheck vitamin D  level -No further workup at this time      Relevant Orders   VITAMIN D  25 Hydroxy (Vit-D Deficiency, Fractures)   Other Visit Diagnoses       Type 2 diabetes mellitus with hyperglycemia,  without long-term current use of insulin (HCC)    -  Primary   Relevant Medications   metFORMIN  (GLUCOPHAGE -XR) 500 MG 24 hr tablet   Other Relevant Orders   POCT glycosylated hemoglobin (Hb A1C) (Completed)   Comprehensive metabolic panel with GFR       Meds ordered this encounter  Medications   meloxicam  (MOBIC ) 7.5 MG tablet    Sig: Take 1 tablet (7.5 mg total) by mouth daily as needed for pain.    Dispense:  30 tablet    Refill:  6   zolpidem  (AMBIEN ) 5 MG tablet    Sig: Take 1 tablet (5 mg total) by mouth at bedtime as needed for sleep.    Dispense:  90 tablet    Refill:  1    Generic ok   metFORMIN  (GLUCOPHAGE -XR) 500 MG 24 hr tablet    Sig: Take 1 tablet (500 mg total) by mouth 2 (two) times daily with a meal.    Dispense:  180 tablet    Refill:  3    No follow-ups on file.  Acelyn Basham, MD

## 2024-09-17 NOTE — Assessment & Plan Note (Signed)
-   This problem is chronic stable -Patient is currently on metformin  1000 mg twice a day as well as Ozempic  1 mg weekly -A1c is at 6.2 today -She would like to try and come off some of her diabetes medication -We will reduce her metformin  to 500 mg twice a day and continue with Ozempic  1 mg weekly and have her follow-up with her PCP in February for further evaluation -Will check CMP today -No further workup at this time

## 2024-09-19 ENCOUNTER — Ambulatory Visit: Payer: Self-pay | Admitting: Internal Medicine

## 2024-11-02 ENCOUNTER — Other Ambulatory Visit: Payer: Self-pay | Admitting: Internal Medicine

## 2024-11-02 ENCOUNTER — Telehealth: Payer: Self-pay

## 2024-11-02 NOTE — Telephone Encounter (Signed)
 Copied from CRM #[id number]. Topic: Clinical - Medication Question >> Nov 02, 2024  2:48 PM Christy Stout wrote: Reason for CRM: pt called to return Bri's call regarding her prescription. Please callback and advise.

## 2024-11-06 NOTE — Telephone Encounter (Signed)
 Bre do you recall calling this pt?

## 2024-11-07 NOTE — Telephone Encounter (Signed)
 Pt returned Bre's call to advise that she received her Ozempic .

## 2024-11-07 NOTE — Telephone Encounter (Signed)
 Thank you :)

## 2024-11-07 NOTE — Telephone Encounter (Signed)
 Yes I called and LVM to make sure she picked up her Ozempic  medication. If she calls back and I am not available you can ask her and document thanks

## 2024-11-23 ENCOUNTER — Encounter

## 2025-05-27 ENCOUNTER — Ambulatory Visit
